# Patient Record
Sex: Female | Born: 1960
Health system: Southern US, Community
[De-identification: ages and names within clinical notes are randomized; demographics above are authoritative.]

## PROBLEM LIST (undated history)

## (undated) DIAGNOSIS — Z9889 Other specified postprocedural states: Secondary | ICD-10-CM

## (undated) DIAGNOSIS — G4730 Sleep apnea, unspecified: Secondary | ICD-10-CM

## (undated) DIAGNOSIS — I10 Essential (primary) hypertension: Secondary | ICD-10-CM

## (undated) DIAGNOSIS — R112 Nausea with vomiting, unspecified: Secondary | ICD-10-CM

## (undated) DIAGNOSIS — D649 Anemia, unspecified: Secondary | ICD-10-CM

## (undated) DIAGNOSIS — J069 Acute upper respiratory infection, unspecified: Secondary | ICD-10-CM

## (undated) DIAGNOSIS — L509 Urticaria, unspecified: Secondary | ICD-10-CM

## (undated) DIAGNOSIS — N8501 Benign endometrial hyperplasia: Secondary | ICD-10-CM

## (undated) DIAGNOSIS — R7303 Prediabetes: Secondary | ICD-10-CM

## (undated) DIAGNOSIS — F419 Anxiety disorder, unspecified: Secondary | ICD-10-CM

## (undated) DIAGNOSIS — B009 Herpesviral infection, unspecified: Secondary | ICD-10-CM

## (undated) DIAGNOSIS — E785 Hyperlipidemia, unspecified: Secondary | ICD-10-CM

## (undated) DIAGNOSIS — G43909 Migraine, unspecified, not intractable, without status migrainosus: Secondary | ICD-10-CM

## (undated) DIAGNOSIS — M7660 Achilles tendinitis, unspecified leg: Secondary | ICD-10-CM

## (undated) HISTORY — DX: Migraine, unspecified, not intractable, without status migrainosus: G43.909

## (undated) HISTORY — PX: TUBAL LIGATION: SHX77

## (undated) HISTORY — DX: Herpesviral infection, unspecified: B00.9

## (undated) HISTORY — DX: Essential (primary) hypertension: I10

## (undated) HISTORY — DX: Anxiety disorder, unspecified: F41.9

## (undated) HISTORY — PX: OTHER SURGICAL HISTORY: SHX169

## (undated) HISTORY — DX: Acute upper respiratory infection, unspecified: J06.9

## (undated) HISTORY — DX: Sleep apnea, unspecified: G47.30

## (undated) HISTORY — DX: Hyperlipidemia, unspecified: E78.5

## (undated) HISTORY — DX: Achilles tendinitis, unspecified leg: M76.60

## (undated) HISTORY — DX: Urticaria, unspecified: L50.9

---

## 1992-09-22 HISTORY — PX: TUBAL LIGATION: SHX77

## 1998-08-28 ENCOUNTER — Encounter: Payer: Self-pay | Admitting: *Deleted

## 1998-08-28 ENCOUNTER — Ambulatory Visit (HOSPITAL_COMMUNITY): Admission: RE | Admit: 1998-08-28 | Discharge: 1998-08-28 | Payer: Self-pay | Admitting: *Deleted

## 2001-01-11 ENCOUNTER — Encounter: Payer: Self-pay | Admitting: Family Medicine

## 2001-01-11 ENCOUNTER — Ambulatory Visit (HOSPITAL_COMMUNITY): Admission: RE | Admit: 2001-01-11 | Discharge: 2001-01-11 | Payer: Self-pay | Admitting: Family Medicine

## 2002-01-14 ENCOUNTER — Encounter: Payer: Self-pay | Admitting: Family Medicine

## 2002-01-14 ENCOUNTER — Ambulatory Visit (HOSPITAL_COMMUNITY): Admission: RE | Admit: 2002-01-14 | Discharge: 2002-01-14 | Payer: Self-pay | Admitting: Family Medicine

## 2002-01-19 ENCOUNTER — Other Ambulatory Visit: Admission: RE | Admit: 2002-01-19 | Discharge: 2002-01-19 | Payer: Self-pay | Admitting: Obstetrics and Gynecology

## 2003-02-21 ENCOUNTER — Ambulatory Visit (HOSPITAL_COMMUNITY): Admission: RE | Admit: 2003-02-21 | Discharge: 2003-02-21 | Payer: Self-pay | Admitting: Family Medicine

## 2003-02-21 ENCOUNTER — Encounter: Payer: Self-pay | Admitting: Family Medicine

## 2003-05-11 ENCOUNTER — Other Ambulatory Visit: Admission: RE | Admit: 2003-05-11 | Discharge: 2003-05-11 | Payer: Self-pay | Admitting: Family Medicine

## 2004-04-11 ENCOUNTER — Ambulatory Visit (HOSPITAL_COMMUNITY): Admission: RE | Admit: 2004-04-11 | Discharge: 2004-04-11 | Payer: Self-pay | Admitting: Family Medicine

## 2004-07-18 ENCOUNTER — Other Ambulatory Visit: Admission: RE | Admit: 2004-07-18 | Discharge: 2004-07-18 | Payer: Self-pay | Admitting: Family Medicine

## 2005-05-28 ENCOUNTER — Ambulatory Visit (HOSPITAL_COMMUNITY): Admission: RE | Admit: 2005-05-28 | Discharge: 2005-05-28 | Payer: Self-pay | Admitting: Family Medicine

## 2005-07-28 ENCOUNTER — Other Ambulatory Visit: Admission: RE | Admit: 2005-07-28 | Discharge: 2005-07-28 | Payer: Self-pay | Admitting: Family Medicine

## 2006-12-07 ENCOUNTER — Ambulatory Visit (HOSPITAL_COMMUNITY): Admission: RE | Admit: 2006-12-07 | Discharge: 2006-12-07 | Payer: Self-pay | Admitting: Family Medicine

## 2007-01-08 ENCOUNTER — Other Ambulatory Visit: Admission: RE | Admit: 2007-01-08 | Discharge: 2007-01-08 | Payer: Self-pay | Admitting: Family Medicine

## 2007-12-14 ENCOUNTER — Ambulatory Visit (HOSPITAL_COMMUNITY): Admission: RE | Admit: 2007-12-14 | Discharge: 2007-12-14 | Payer: Self-pay | Admitting: Family Medicine

## 2008-01-19 ENCOUNTER — Other Ambulatory Visit: Admission: RE | Admit: 2008-01-19 | Discharge: 2008-01-19 | Payer: Self-pay | Admitting: Family Medicine

## 2008-12-29 ENCOUNTER — Ambulatory Visit (HOSPITAL_COMMUNITY): Admission: RE | Admit: 2008-12-29 | Discharge: 2008-12-29 | Payer: Self-pay | Admitting: Internal Medicine

## 2009-01-11 ENCOUNTER — Encounter: Payer: Self-pay | Admitting: Internal Medicine

## 2009-09-22 HISTORY — PX: COLONOSCOPY: SHX174

## 2009-12-31 ENCOUNTER — Ambulatory Visit (HOSPITAL_COMMUNITY): Admission: RE | Admit: 2009-12-31 | Discharge: 2009-12-31 | Payer: Self-pay | Admitting: Internal Medicine

## 2010-01-15 ENCOUNTER — Other Ambulatory Visit: Admission: RE | Admit: 2010-01-15 | Discharge: 2010-01-15 | Payer: Self-pay | Admitting: Internal Medicine

## 2010-01-15 ENCOUNTER — Encounter: Payer: Self-pay | Admitting: Internal Medicine

## 2010-01-15 LAB — HM PAP SMEAR: HM Pap smear: NORMAL

## 2010-01-17 ENCOUNTER — Encounter (INDEPENDENT_AMBULATORY_CARE_PROVIDER_SITE_OTHER): Payer: Self-pay | Admitting: *Deleted

## 2010-01-31 DIAGNOSIS — R9431 Abnormal electrocardiogram [ECG] [EKG]: Secondary | ICD-10-CM | POA: Insufficient documentation

## 2010-01-31 DIAGNOSIS — E785 Hyperlipidemia, unspecified: Secondary | ICD-10-CM | POA: Insufficient documentation

## 2010-02-01 ENCOUNTER — Ambulatory Visit: Payer: Self-pay | Admitting: Internal Medicine

## 2010-02-01 DIAGNOSIS — I10 Essential (primary) hypertension: Secondary | ICD-10-CM | POA: Insufficient documentation

## 2010-02-05 ENCOUNTER — Encounter (INDEPENDENT_AMBULATORY_CARE_PROVIDER_SITE_OTHER): Payer: Self-pay | Admitting: *Deleted

## 2010-02-07 ENCOUNTER — Ambulatory Visit: Payer: Self-pay | Admitting: Gastroenterology

## 2010-02-20 LAB — HM COLONOSCOPY

## 2010-02-21 ENCOUNTER — Ambulatory Visit: Payer: Self-pay | Admitting: Gastroenterology

## 2010-03-05 ENCOUNTER — Ambulatory Visit: Payer: Self-pay | Admitting: Internal Medicine

## 2010-03-05 ENCOUNTER — Encounter: Payer: Self-pay | Admitting: Internal Medicine

## 2010-03-05 ENCOUNTER — Ambulatory Visit (HOSPITAL_COMMUNITY): Admission: RE | Admit: 2010-03-05 | Discharge: 2010-03-05 | Payer: Self-pay | Admitting: Internal Medicine

## 2010-03-05 ENCOUNTER — Ambulatory Visit: Payer: Self-pay

## 2010-03-13 ENCOUNTER — Telehealth: Payer: Self-pay | Admitting: Internal Medicine

## 2010-09-22 HISTORY — PX: CERVICAL DISC SURGERY: SHX588

## 2010-10-22 NOTE — Procedures (Signed)
Summary: Colonoscopy  Patient: [name] Note: All result statuses are Final unless otherwise noted.  Tests: (1) Colonoscopy (COL)   COL Colonoscopy           DONE (C)     [facility]     [address]     [place], [place]  [address]           COLONOSCOPY PROCEDURE REPORT           PATIENT:  Natasha, Fritz  MR#:  [id number]     BIRTHDATE:  Feb 11, 1961, 49 yrs. old  GENDER:  female           ENDOSCOPIST:  Barbette Hair. Arlyce Dice, MD     Referred by:           PROCEDURE DATE:  02/21/2010     PROCEDURE:  Diagnostic Colonoscopy     ASA CLASS:  Class II     INDICATIONS:  1) Routine Risk Screening           MEDICATIONS:   Fentanyl 100 mcg IV, Versed 9 mg IV           DESCRIPTION OF PROCEDURE:   After the risks benefits and     alternatives of the procedure were thoroughly explained, informed     consent was obtained.  Digital rectal exam was performed and     revealed no abnormalities.   The LB CF-[id number] endoscope     was introduced through the anus and advanced to the cecum, which     was identified by both the appendix and ileocecal valve, without     limitations.  The quality of the prep was excellent, using     MoviPrep.  The instrument was then slowly withdrawn as the colon     was fully examined.     <<PROCEDUREIMAGES>>           FINDINGS:  A normal appearing cecum, ileocecal valve, and     appendiceal orifice were identified. The ascending, hepatic     flexure, transverse, splenic flexure, descending, sigmoid colon,     and rectum appeared unremarkable (see image1, image2, image3,     image6, image8, image9, and image10).   Retroflexed views in the     rectum revealed no abnormalities.    The time to cecum =  6.0     minutes. The scope was then withdrawn (time =  7.0  min) from the     patient and the procedure completed.           COMPLICATIONS:  None           ENDOSCOPIC IMPRESSION:     1) Normal colon     RECOMMENDATIONS:           1) Continue current  colorectal screening recommendations for     "routine risk" patients with a repeat colonoscopy in 10 years.           REPEAT EXAM:  In 10 year(s) for Colonoscopy.           ______________________________     Barbette Hair. Arlyce Dice, MD           CC: Marisue Brooklyn, DO           n.     REVISED:  02/21/2010 09:20 AM     eSIGNED:   Barbette Hair. Johnnie Moten at 02/21/2010 09:20 AM           Gentry Fitz, [id number]  Note:  An exclamation mark (!) indicates a result that was not dispersed into the flowsheet. Document Creation Date: 02/21/2010 9:21 AM _______________________________________________________________________  (1) Order result status: Final Collection or observation date-time: 02/21/2010 09:04 Requested date-time:  Receipt date-time:  Reported date-time:  Referring Physician:   Ordering Physician: Melvia Heaps [phone]) Specimen Source:  Source: [name] Order Number: [id number] Lab site:   Appended Document: Colonoscopy    Clinical Lists Changes  Observations: Added new observation of COLONNXTDUE: 02/2020 (02/21/2010 13:03)

## 2010-10-22 NOTE — Miscellaneous (Signed)
Summary: [facility] previsit  Clinical Lists Changes  Medications: Added new medication of MOVIPREP 100 GM  SOLR (PEG-KCL-NACL-NASULF-NA ASC-C) As per prep instructions. - Signed Rx of MOVIPREP 100 GM  SOLR (PEG-KCL-NACL-NASULF-NA ASC-C) As per prep instructions.;  #1 x 0;  Signed;  Entered by: Karl Bales RN;  Authorized by: Louis Meckel MD;  Method used: Electronically to [facility]*, [address], [address] Shipping/mailing, [place], [place]  [address], Ph: [phone], Fax: [phone]    Prescriptions: MOVIPREP 100 GM  SOLR (PEG-KCL-NACL-NASULF-NA ASC-C) As per prep instructions.  #1 x 0   Entered by:   Karl Bales RN   Authorized by:   Louis Meckel MD   Signed by:   Karl Bales RN on 02/07/2010   Method used:   Electronically to        [facility]* (retail)       [address]       [address] Shipping/mailing       [place], [place]  [address]       Ph: [phone]       Fax: [phone]   RxID:   [id number]

## 2010-10-22 NOTE — Progress Notes (Signed)
Summary: rtn call from yesterday  Phone Note Call from Patient Call back at [phone]   Caller: Patient Reason for Call: Talk to Nurse, Talk to Doctor Summary of Call: rtn call from yesterday Initial call taken by: Omer Jack,  March 13, 2010 1:37 PM  Follow-up for Phone Call        Called patient with echo results. Follow-up by: Suzan Garibaldi RN

## 2010-10-22 NOTE — Letter (Signed)
Summary: Previsit letter  [facility]  [address] [place], [place] [address]   Phone: [phone]  Fax: [phone]       01/17/2010 MRN: [id number]  [facility] [address] [place], [place]  [address]  Dear Natasha Fritz,  Welcome to the Gastroenterology Division at [facility].    You are scheduled to see a nurse for your pre-procedure visit on 02-07-10 at 8:00a.m. on the 3rd floor at [facility], [address]  We ask that you try to arrive at our office 15 minutes prior to your appointment time to allow for check-in.  Your nurse visit will consist of discussing your medical and surgical history, your immediate family medical history, and your medications.    Please bring a complete list of all your medications or, if you prefer, bring the medication bottles and we will list them.  We will need to be aware of both prescribed and over the counter drugs.  We will need to know exact dosage information as well.  If you are on blood thinners (Coumadin, Plavix, Aggrenox, Ticlid, etc.) please call our office today/prior to your appointment, as we need to consult with your physician about holding your medication.   Please be prepared to read and sign documents such as consent forms, a financial agreement, and acknowledgement forms.  If necessary, and with your consent, a friend or relative is welcome to sit-in on the nurse visit with you.  Please bring your insurance card so that we may make a copy of it.  If your insurance requires a referral to see a specialist, please bring your referral form from your primary care physician.  No co-pay is required for this nurse visit.     If you cannot keep your appointment, please call [phone] to cancel or reschedule prior to your appointment date.  This allows [place] the opportunity to schedule an appointment for another patient in need of care.    Thank you for choosing [facility] for your medical needs.   We appreciate the opportunity to care for you.  Please visit [place] at our website  to learn more about our practice.                     Sincerely.                                                                                                                   The Gastroenterology Division

## 2010-10-22 NOTE — Letter (Signed)
Summary: [facility] Adolescent Physical Exam Note   [facility] Adolescent Physical Exam Note   Imported By: Roderic Ovens 03/22/2010 11:52:25  _____________________________________________________________________  External Attachment:    Type:   Image     Comment:   External Document

## 2010-10-22 NOTE — Assessment & Plan Note (Signed)
Summary: np6/abn ekg/jml   Visit Type:  new pt visit Referring Provider:  Marisue Brooklyn Primary Provider:  Marisue Brooklyn  CC:  Abnormal EKG as per Dr. Marisue Brooklyn office...does offer chest tightness as complaint...denies any sob or edema.  History of Present Illness:  Patient is a 50 year old with no prior cardiac history.  He has had very few episodes of chest pressure that can occur with and without acitity.  She really doesn't notice.  Thinks it is more surface, like muscular. She is active during the day.  Denies problems.  Does not work out regularly. She was seen recently by A. Elisabeth Most.  EKG was done that was abnormal.    S  Preventive Screening-Counseling & Management  Alcohol-Tobacco     Smoking Status: never  Caffeine-Diet-Exercise     Does Patient Exercise: yes      Drug Use:  no.    Current Medications (verified): 1)  Benicar 20 Mg Tabs (Olmesartan Medoxomil) .[name].. 1 Tab Once Daily 2)  Amerge 2.5 Mg Tabs (Naratriptan Hcl) .... Use W/3 20mg  Predisone As Needed For Migraines 3)  Valtrex 500 Mg Tabs (Valacyclovir Hcl) .... As Needed For Fever Blisters 4)  Multivitamins   Tabs (Multiple Vitamin) .[name].. 1 Tab Once Daily 5)  Super B Complex  Tabs (B Complex-C) .[name].. 1 Tab Once Daily 6)  Vitamin D3 2000 Unit Caps (Cholecalciferol) .[name].. 1 Cap Once Daily 7)  Aspirin 81 Mg Tbec (Aspirin) .... Take One Tablet By Mouth Daily 8)  Vitamin C 1000 Mg Tabs (Ascorbic Acid) .... As Needed in The Winter 9)  Vitamin E 400 Unit Caps (Vitamin E) .... As Needed  Allergies (verified): 1)  ! Pcn  Past History:  Family History: Last updated: 02/02/2010 Father: deceased @ 65 Injury during fall caused pneumonia  Social History: Last updated: 2010-02-02 Married  Tobacco Use - No.  Alcohol Use - no Regular Exercise - yes Drug Use - no Full Time  Past Medical History: HYPERTENSION (ICD-401.9)  Past Surgical History: Tubal ligation ACL replacement  Family History: Father:  deceased @ 10 Injury during fall caused pneumonia  Social History: Married  Tobacco Use - No.  Alcohol Use - no Regular Exercise - yes Drug Use - no Full Time Smoking Status:  never Does Patient Exercise:  yes Drug Use:  no  Review of Systems         All systems reviewed.  negative to the above problem Cholesterol panel from 01/16/10  total cholesterol:  189;  LDL 125 (was 109); HDl 38 (was 33)     Vital Signs:  Patient profile:   50 year old female Height:      67 inches Weight:      170 pounds BMI:     26.72 Pulse rate:   70 / minute Pulse rhythm:   irregular BP sitting:   118 / 80  (left arm) Cuff size:   large  Vitals Entered By: Danielle Rankin, CMA (02/02/10 2:58 PM)  Physical Exam  Additional Exam:  Patient is in nad HEENT:  Normocephalic, atraumatic. EOMI, PERRLA.  Neck: JVP is normal. No thyromegaly. No bruits.  Lungs: clear to auscultation. No rales no wheezes.  Heart: Regular rate and rhythm. Normal S1, S2. No S3.   No significant murmurs. PMI not displaced.  Abdomen:  Supple, nontender. Normal bowel sounds. No masses. No hepatomegaly.  Extremities:   Good distal pulses throughout. No lower extremity edema.  Musculoskeletal :moving all extremities.  Neuro:   alert and oriented x3.    Problems:  Medical Problems Added: 1)  Dx of Unspecified Cardiovascular Disease  (ICD-429.2) 2)  Dx of Hypertension  (ICD-401.9)  EKG  Procedure date:  02/01/2010  Findings:      NSR.  70 bpm Possible anterior MI  Impression & Recommendations:  Problem # 1:  ABNORMAL EKG (ICD-794.31)  I am not convinced the patient has heart problems other than controlled HTN.  EKG is abnormal but may be normal for her based on how heart sits in chest I would recommend and echo to define.  If normal I would not persue further.   I encouraged her to increase her activity.  Problem # 2:  HYPERTENSION (ICD-401.9) Good control.  Will need to be followed  Problem # 3:   HYPERLIPIDEMIA-MIXED (ICD-272.4) Discussed diet, exercise.  Should f/u  Other Orders: EKG w/ Interpretation (93000) Echocardiogram (Echo)  Patient Instructions: 1)  Your physician has requested that you have an echocardiogram.  Echocardiography is a painless test that uses sound waves to create images of your heart. It provides your doctor with information about the size and shape of your heart and how well your heart's chambers and valves are working.  This procedure takes approximately one hour. There are no restrictions for this procedure. we will call you with results.

## 2010-10-22 NOTE — Letter (Signed)
Summary: [facility] Instructions  [facility]  [address] [place], [place] [address]   Phone: [phone]  Fax: [phone]       Natasha Fritz    Nov 05, 1960    MRN: [id number]        Procedure Day Dorna Bloom:  Lenor Coffin  02/21/10     Arrival Time:  7:30am     Procedure Time:  8:30am    Location of Procedure:                    _ X_  [facility] (4th Floor)                        PREPARATION FOR COLONOSCOPY WITH MOVIPREP   Starting 5 days prior to your procedure SATURDAY 05/28  do not eat nuts, seeds, popcorn, corn, beans, peas,  salads, or any raw vegetables.  Do not take any fiber supplements (e.g. Metamucil, Citrucel, and Benefiber).  THE DAY BEFORE YOUR PROCEDURE         DATE: [facility] 06/01  1.  Drink clear liquids the entire day-NO SOLID FOOD  2.  Do not drink anything colored red or purple.  Avoid juices with pulp.  No orange juice.  3.  Drink at least 64 oz. (8 glasses) of fluid/clear liquids during the day to prevent dehydration and help the prep work efficiently.  CLEAR LIQUIDS INCLUDE: Water Jello Ice Popsicles Tea (sugar ok, no milk/cream) Powdered fruit flavored drinks Coffee (sugar ok, no milk/cream) Gatorade Juice: apple, white grape, white cranberry  Lemonade Clear bullion, consomm, broth Carbonated beverages (any kind) Strained chicken noodle soup Hard Candy                             4.  In the morning, mix first dose of MoviPrep solution:    Empty 1 Pouch A and 1 Pouch B into the disposable container    Add lukewarm drinking water to the top line of the container. Mix to dissolve    Refrigerate (mixed solution should be used within 24 hrs)  5.  Begin drinking the prep at 5:00 p.m. The MoviPrep container is divided by 4 marks.   Every 15 minutes drink the solution down to the next mark (approximately 8 oz) until the full liter is complete.   6.  Follow completed prep with 16 oz of clear liquid of your choice (Nothing  red or purple).  Continue to drink clear liquids until bedtime.  7.  Before going to bed, mix second dose of MoviPrep solution:    Empty 1 Pouch A and 1 Pouch B into the disposable container    Add lukewarm drinking water to the top line of the container. Mix to dissolve    Refrigerate  THE DAY OF YOUR PROCEDURE      DATE: THURSDAY  06/02  Beginning at  3:30 a.m. (5 hours before procedure):         1. Every 15 minutes, drink the solution down to the next mark (approx 8 oz) until the full liter is complete.  2. Follow completed prep with 16 oz. of clear liquid of your choice.    3. You may drink clear liquids until 6:30am  (2 HOURS BEFORE PROCEDURE).   MEDICATION INSTRUCTIONS  Unless otherwise instructed, you should take regular prescription medications with a small sip of water   as early as possible the  morning of your procedure.         OTHER INSTRUCTIONS  You will need a responsible adult at least 51 years of age to accompany you and drive you home.   This person must remain in the waiting room during your procedure.  Wear loose fitting clothing that is easily removed.  Leave jewelry and other valuables at home.  However, you may wish to bring a book to read or  an iPod/MP3 player to listen to music as you wait for your procedure to start.  Remove all body piercing jewelry and leave at home.  Total time from sign-in until discharge is approximately 2-3 hours.  You should go home directly after your procedure and rest.  You can resume normal activities the  day after your procedure.  The day of your procedure you should not:   Drive   Make legal decisions   Operate machinery   Drink alcohol   Return to work  You will receive specific instructions about eating, activities and medications before you leave.    The above instructions have been reviewed and explained to me by   Karl Bales RN  Feb 07, 2010 8:15 AM    I fully understand and can  verbalize these instructions _____________________________ Date _________

## 2010-10-22 NOTE — Miscellaneous (Signed)
Summary: gi [facility]  Clinical Lists Changes  Medications: Added new medication of PROMETHAZINE HCL 25 MG TABS (PROMETHAZINE HCL) 1 by mouth q 6 h as needed nausea - Signed Rx of PROMETHAZINE HCL 25 MG TABS (PROMETHAZINE HCL) 1 by mouth q 6 h as needed nausea;  #15 x 0;  Signed;  Entered by: Eual Fines RN;  Authorized by: Louis Meckel MD;  Method used: Electronically to [facility]*, [address], [place], [place]  [id number], Ph: [phone], Fax: [phone] Observations: Added new observation of ALLERGY REV: Done (02/21/2010 10:11)    Prescriptions: PROMETHAZINE HCL 25 MG TABS (PROMETHAZINE HCL) 1 by mouth q 6 h as needed nausea  #15 x 0   Entered by:   Eual Fines RN   Authorized by:   Louis Meckel MD   Signed by:   Eual Fines RN on 02/21/2010   Method used:   Electronically to        [facility]* (retail)       [address]       [place], [place]  [id number]       Ph: [phone]       Fax: [phone]   RxID:   [id number]

## 2010-12-23 ENCOUNTER — Other Ambulatory Visit (HOSPITAL_COMMUNITY): Payer: Self-pay | Admitting: Internal Medicine

## 2010-12-23 DIAGNOSIS — Z1231 Encounter for screening mammogram for malignant neoplasm of breast: Secondary | ICD-10-CM

## 2011-01-02 ENCOUNTER — Ambulatory Visit (HOSPITAL_COMMUNITY)
Admission: RE | Admit: 2011-01-02 | Discharge: 2011-01-02 | Disposition: A | Discharge status: Home / Self Care | Payer: BC Managed Care – HMO | Source: Ambulatory Visit | Attending: Internal Medicine | Admitting: Internal Medicine

## 2011-01-02 DIAGNOSIS — Z1231 Encounter for screening mammogram for malignant neoplasm of breast: Secondary | ICD-10-CM | POA: Insufficient documentation

## 2011-02-19 ENCOUNTER — Emergency Department (HOSPITAL_COMMUNITY): Payer: Managed Care, Other (non HMO)

## 2011-02-19 ENCOUNTER — Emergency Department (HOSPITAL_COMMUNITY)
Admission: EM | Admit: 2011-02-19 | Discharge: 2011-02-20 | Disposition: A | Discharge status: Home / Self Care | Payer: Managed Care, Other (non HMO) | Attending: Emergency Medicine | Admitting: Emergency Medicine

## 2011-02-19 DIAGNOSIS — M5412 Radiculopathy, cervical region: Secondary | ICD-10-CM | POA: Insufficient documentation

## 2011-02-19 DIAGNOSIS — M79609 Pain in unspecified limb: Secondary | ICD-10-CM | POA: Insufficient documentation

## 2011-02-19 DIAGNOSIS — I10 Essential (primary) hypertension: Secondary | ICD-10-CM | POA: Insufficient documentation

## 2011-02-19 DIAGNOSIS — R209 Unspecified disturbances of skin sensation: Secondary | ICD-10-CM | POA: Insufficient documentation

## 2011-02-19 DIAGNOSIS — M546 Pain in thoracic spine: Secondary | ICD-10-CM | POA: Insufficient documentation

## 2011-02-19 DIAGNOSIS — Z79899 Other long term (current) drug therapy: Secondary | ICD-10-CM | POA: Insufficient documentation

## 2011-02-19 DIAGNOSIS — M542 Cervicalgia: Secondary | ICD-10-CM | POA: Insufficient documentation

## 2011-02-19 DIAGNOSIS — R079 Chest pain, unspecified: Secondary | ICD-10-CM | POA: Insufficient documentation

## 2011-02-19 LAB — DIFFERENTIAL
Basophils Absolute: 0 10*3/uL (ref 0.0–0.1)
Basophils Relative: 0 % (ref 0–1)
Eosinophils Absolute: 0 10*3/uL (ref 0.0–0.7)
Eosinophils Relative: 0 % (ref 0–5)
Lymphocytes Relative: 7 % — ABNORMAL LOW (ref 12–46)
Lymphs Abs: 1 10*3/uL (ref 0.7–4.0)
Monocytes Absolute: 0.8 10*3/uL (ref 0.1–1.0)
Monocytes Relative: 5 % (ref 3–12)
Neutro Abs: 13.1 10*3/uL — ABNORMAL HIGH (ref 1.7–7.7)
Neutrophils Relative %: 88 % — ABNORMAL HIGH (ref 43–77)

## 2011-02-19 LAB — CBC
HCT: 36.7 % (ref 36.0–46.0)
Hemoglobin: 12.6 g/dL (ref 12.0–15.0)
MCH: 31.2 pg (ref 26.0–34.0)
MCHC: 34.3 g/dL (ref 30.0–36.0)
MCV: 90.8 fL (ref 78.0–100.0)
Platelets: 275 10*3/uL (ref 150–400)
RBC: 4.04 MIL/uL (ref 3.87–5.11)
RDW: 12.9 % (ref 11.5–15.5)
WBC: 14.9 10*3/uL — ABNORMAL HIGH (ref 4.0–10.5)

## 2011-02-19 LAB — BASIC METABOLIC PANEL
BUN: 14 mg/dL (ref 6–23)
CO2: 25 mEq/L (ref 19–32)
Calcium: 8.9 mg/dL (ref 8.4–10.5)
Chloride: 106 mEq/L (ref 96–112)
Creatinine, Ser: 0.73 mg/dL (ref 0.4–1.2)
GFR calc Af Amer: >60 mL/min (ref >60)
GFR calc non Af Amer: >60 mL/min (ref >60)
Glucose, Bld: 153 mg/dL — ABNORMAL HIGH (ref 70–99)
Potassium: 3.7 mEq/L (ref 3.5–5.1)
Sodium: 138 mEq/L (ref 135–145)

## 2011-02-19 LAB — CK TOTAL AND CKMB (NOT AT ARMC)
CK, MB: 1 ng/mL (ref 0.3–4.0)
Relative Index: INVALID (ref 0.0–2.5)
Total CK: 42 U/L (ref 7–177)

## 2011-02-19 LAB — D-DIMER, QUANTITATIVE (NOT AT ARMC): D-Dimer, Quant: 0.31 ug/mL-FEU (ref 0.00–0.48)

## 2011-02-19 LAB — TROPONIN I: Troponin I: <0.3 ng/mL (ref <0.30)

## 2011-02-24 ENCOUNTER — Ambulatory Visit: Payer: Managed Care, Other (non HMO) | Attending: Orthopedic Surgery | Admitting: Physical Therapy

## 2011-02-24 DIAGNOSIS — M6281 Muscle weakness (generalized): Secondary | ICD-10-CM | POA: Insufficient documentation

## 2011-02-24 DIAGNOSIS — M2560 Stiffness of unspecified joint, not elsewhere classified: Secondary | ICD-10-CM | POA: Insufficient documentation

## 2011-02-24 DIAGNOSIS — M25519 Pain in unspecified shoulder: Secondary | ICD-10-CM | POA: Insufficient documentation

## 2011-02-24 DIAGNOSIS — IMO0001 Reserved for inherently not codable concepts without codable children: Secondary | ICD-10-CM | POA: Insufficient documentation

## 2011-02-24 DIAGNOSIS — M25539 Pain in unspecified wrist: Secondary | ICD-10-CM | POA: Insufficient documentation

## 2011-02-24 DIAGNOSIS — M542 Cervicalgia: Secondary | ICD-10-CM | POA: Insufficient documentation

## 2011-02-26 ENCOUNTER — Ambulatory Visit: Payer: Managed Care, Other (non HMO) | Admitting: Physical Therapy

## 2011-02-28 ENCOUNTER — Ambulatory Visit: Payer: Managed Care, Other (non HMO) | Admitting: Physical Therapy

## 2011-03-03 ENCOUNTER — Ambulatory Visit: Payer: Managed Care, Other (non HMO) | Admitting: Physical Therapy

## 2011-03-05 ENCOUNTER — Ambulatory Visit: Payer: Managed Care, Other (non HMO) | Admitting: Physical Therapy

## 2011-03-07 ENCOUNTER — Encounter: Payer: Managed Care, Other (non HMO) | Admitting: Physical Therapy

## 2011-03-18 ENCOUNTER — Ambulatory Visit: Payer: Managed Care, Other (non HMO) | Admitting: Physical Therapy

## 2011-03-20 ENCOUNTER — Ambulatory Visit: Payer: Managed Care, Other (non HMO) | Admitting: Physical Therapy

## 2011-03-21 ENCOUNTER — Encounter: Payer: Managed Care, Other (non HMO) | Admitting: Physical Therapy

## 2011-03-25 ENCOUNTER — Encounter: Payer: Managed Care, Other (non HMO) | Admitting: Physical Therapy

## 2011-03-27 ENCOUNTER — Encounter: Payer: Self-pay | Admitting: Internal Medicine

## 2011-03-27 ENCOUNTER — Encounter: Payer: Managed Care, Other (non HMO) | Admitting: Physical Therapy

## 2011-04-01 ENCOUNTER — Encounter: Payer: Managed Care, Other (non HMO) | Admitting: Physical Therapy

## 2011-04-03 ENCOUNTER — Encounter: Payer: Managed Care, Other (non HMO) | Admitting: Physical Therapy

## 2011-04-04 ENCOUNTER — Encounter: Payer: Managed Care, Other (non HMO) | Admitting: Physical Therapy

## 2011-04-30 ENCOUNTER — Ambulatory Visit: Payer: Managed Care, Other (non HMO) | Attending: Orthopedic Surgery | Admitting: Physical Therapy

## 2011-04-30 DIAGNOSIS — M25519 Pain in unspecified shoulder: Secondary | ICD-10-CM | POA: Insufficient documentation

## 2011-04-30 DIAGNOSIS — M542 Cervicalgia: Secondary | ICD-10-CM | POA: Insufficient documentation

## 2011-04-30 DIAGNOSIS — IMO0001 Reserved for inherently not codable concepts without codable children: Secondary | ICD-10-CM | POA: Insufficient documentation

## 2011-04-30 DIAGNOSIS — M6281 Muscle weakness (generalized): Secondary | ICD-10-CM | POA: Insufficient documentation

## 2011-04-30 DIAGNOSIS — M25539 Pain in unspecified wrist: Secondary | ICD-10-CM | POA: Insufficient documentation

## 2011-04-30 DIAGNOSIS — M2560 Stiffness of unspecified joint, not elsewhere classified: Secondary | ICD-10-CM | POA: Insufficient documentation

## 2011-05-01 ENCOUNTER — Ambulatory Visit: Payer: Managed Care, Other (non HMO) | Admitting: Physical Therapy

## 2011-05-05 ENCOUNTER — Ambulatory Visit: Payer: Managed Care, Other (non HMO) | Admitting: Physical Therapy

## 2011-05-06 ENCOUNTER — Ambulatory Visit: Payer: Managed Care, Other (non HMO) | Admitting: Physical Therapy

## 2011-05-12 ENCOUNTER — Ambulatory Visit: Payer: Managed Care, Other (non HMO) | Admitting: Physical Therapy

## 2011-05-14 ENCOUNTER — Ambulatory Visit: Payer: Managed Care, Other (non HMO) | Admitting: Physical Therapy

## 2011-05-16 ENCOUNTER — Ambulatory Visit: Payer: Managed Care, Other (non HMO) | Admitting: Physical Therapy

## 2011-05-19 ENCOUNTER — Ambulatory Visit: Payer: Managed Care, Other (non HMO) | Admitting: Physical Therapy

## 2011-05-20 ENCOUNTER — Ambulatory Visit: Payer: Managed Care, Other (non HMO) | Admitting: Physical Therapy

## 2011-05-22 ENCOUNTER — Ambulatory Visit: Payer: Managed Care, Other (non HMO) | Admitting: Physical Therapy

## 2011-05-27 ENCOUNTER — Ambulatory Visit: Payer: Managed Care, Other (non HMO) | Attending: Orthopedic Surgery | Admitting: Physical Therapy

## 2011-05-27 DIAGNOSIS — M25539 Pain in unspecified wrist: Secondary | ICD-10-CM | POA: Insufficient documentation

## 2011-05-27 DIAGNOSIS — M6281 Muscle weakness (generalized): Secondary | ICD-10-CM | POA: Insufficient documentation

## 2011-05-27 DIAGNOSIS — IMO0001 Reserved for inherently not codable concepts without codable children: Secondary | ICD-10-CM | POA: Insufficient documentation

## 2011-05-27 DIAGNOSIS — M542 Cervicalgia: Secondary | ICD-10-CM | POA: Insufficient documentation

## 2011-05-27 DIAGNOSIS — M2560 Stiffness of unspecified joint, not elsewhere classified: Secondary | ICD-10-CM | POA: Insufficient documentation

## 2011-05-27 DIAGNOSIS — M25519 Pain in unspecified shoulder: Secondary | ICD-10-CM | POA: Insufficient documentation

## 2011-05-29 ENCOUNTER — Ambulatory Visit: Payer: Managed Care, Other (non HMO) | Admitting: Physical Therapy

## 2011-05-30 ENCOUNTER — Ambulatory Visit: Payer: Managed Care, Other (non HMO) | Admitting: Physical Therapy

## 2011-06-03 ENCOUNTER — Ambulatory Visit: Payer: Managed Care, Other (non HMO) | Admitting: Physical Therapy

## 2011-06-04 ENCOUNTER — Ambulatory Visit: Payer: Managed Care, Other (non HMO) | Admitting: Physical Therapy

## 2011-06-09 ENCOUNTER — Ambulatory Visit: Payer: Managed Care, Other (non HMO) | Admitting: Physical Therapy

## 2011-06-13 ENCOUNTER — Encounter: Payer: Managed Care, Other (non HMO) | Admitting: Physical Therapy

## 2011-06-17 ENCOUNTER — Encounter: Payer: Managed Care, Other (non HMO) | Admitting: Physical Therapy

## 2011-06-19 ENCOUNTER — Encounter: Payer: Managed Care, Other (non HMO) | Admitting: Physical Therapy

## 2011-06-24 ENCOUNTER — Encounter: Payer: Managed Care, Other (non HMO) | Admitting: Physical Therapy

## 2011-06-26 ENCOUNTER — Encounter: Payer: Managed Care, Other (non HMO) | Admitting: Physical Therapy

## 2012-01-20 ENCOUNTER — Other Ambulatory Visit (HOSPITAL_COMMUNITY): Payer: Self-pay | Admitting: Internal Medicine

## 2012-01-20 DIAGNOSIS — Z1231 Encounter for screening mammogram for malignant neoplasm of breast: Secondary | ICD-10-CM

## 2012-02-12 ENCOUNTER — Ambulatory Visit (HOSPITAL_COMMUNITY)
Admission: RE | Admit: 2012-02-12 | Discharge: 2012-02-12 | Disposition: A | Discharge status: Home / Self Care | Payer: Managed Care, Other (non HMO) | Source: Ambulatory Visit | Attending: Internal Medicine | Admitting: Internal Medicine

## 2012-02-12 DIAGNOSIS — Z1231 Encounter for screening mammogram for malignant neoplasm of breast: Secondary | ICD-10-CM

## 2012-08-31 ENCOUNTER — Encounter (HOSPITAL_COMMUNITY): Payer: Self-pay | Admitting: Pharmacist

## 2012-09-02 ENCOUNTER — Other Ambulatory Visit: Payer: Self-pay | Admitting: Obstetrics & Gynecology

## 2012-09-06 ENCOUNTER — Encounter (HOSPITAL_COMMUNITY)
Admission: RE | Admit: 2012-09-06 | Discharge: 2012-09-06 | Disposition: A | Discharge status: Home / Self Care | Payer: Managed Care, Other (non HMO) | Source: Ambulatory Visit | Attending: Obstetrics & Gynecology | Admitting: Obstetrics & Gynecology

## 2012-09-06 ENCOUNTER — Encounter (HOSPITAL_COMMUNITY): Payer: Self-pay

## 2012-09-06 HISTORY — DX: Other specified postprocedural states: Z98.890

## 2012-09-06 HISTORY — DX: Nausea with vomiting, unspecified: R11.2

## 2012-09-06 LAB — CBC
HCT: 40.2 % (ref 36.0–46.0)
Hemoglobin: 12.9 g/dL (ref 12.0–15.0)
MCH: 29.8 pg (ref 26.0–34.0)
MCHC: 32.1 g/dL (ref 30.0–36.0)
MCV: 92.8 fL (ref 78.0–100.0)
Platelets: 255 10*3/uL (ref 150–400)
RBC: 4.33 MIL/uL (ref 3.87–5.11)
RDW: 12.6 % (ref 11.5–15.5)
WBC: 6.5 10*3/uL (ref 4.0–10.5)

## 2012-09-06 LAB — SURGICAL PCR SCREEN
MRSA, PCR: NEGATIVE
Staphylococcus aureus: NEGATIVE

## 2012-09-06 NOTE — Patient Instructions (Addendum)
20 Natasha Fritz  09/06/2012   Your procedure is scheduled on:  09/10/12  Enter through the Main Entrance of [facility] at 1130 AM.  Pick up the phone at the desk and dial [phone].   Call this number if you have problems the morning of surgery: [phone]   Remember:   Do not eat food:After Midnight.  Do not drink clear liquids: 4 Hours before arrival.  Take these medicines the morning of surgery with A SIP OF WATER: Blood pressure medication   Do not wear jewelry, make-up or nail polish.  Do not wear lotions, powders, or perfumes. You may wear deodorant.  Do not shave 48 hours prior to surgery.  Do not bring valuables to the hospital.  Contacts, dentures or bridgework may not be worn into surgery.  Leave suitcase in the car. After surgery it may be brought to your room.  For patients admitted to the hospital, checkout time is 11:00 AM the day of discharge.   Patients discharged the day of surgery will not be allowed to drive home.  Name and phone number of your driver: NA  Special Instructions: Shower using CHG 2 nights before surgery and the night before surgery.  If you shower the day of surgery use CHG.  Use special wash - you have one bottle of CHG for all showers.  You should use approximately 1/3 of the bottle for each shower.   Please read over the following fact sheets that you were given: MRSA Information

## 2012-09-09 MED ORDER — GENTAMICIN SULFATE 40 MG/ML IJ SOLN
INTRAVENOUS | Status: AC
  Administered 2012-09-10: 332 mL via INTRAVENOUS
  Filled 2012-09-09: qty 8.3

## 2012-09-10 ENCOUNTER — Encounter (HOSPITAL_COMMUNITY)
Admission: RE | Disposition: A | Discharge status: Home / Self Care | Payer: Self-pay | Source: Ambulatory Visit | Attending: Obstetrics & Gynecology

## 2012-09-10 ENCOUNTER — Ambulatory Visit (HOSPITAL_COMMUNITY)
Admission: RE | Admit: 2012-09-10 | Discharge: 2012-09-11 | Disposition: A | Discharge status: Home / Self Care | Payer: Managed Care, Other (non HMO) | Source: Ambulatory Visit | Attending: Obstetrics & Gynecology | Admitting: Obstetrics & Gynecology

## 2012-09-10 ENCOUNTER — Encounter (HOSPITAL_COMMUNITY): Payer: Self-pay | Admitting: Obstetrics & Gynecology

## 2012-09-10 ENCOUNTER — Encounter (HOSPITAL_COMMUNITY): Payer: Self-pay

## 2012-09-10 ENCOUNTER — Ambulatory Visit (HOSPITAL_COMMUNITY): Payer: Managed Care, Other (non HMO)

## 2012-09-10 DIAGNOSIS — D251 Intramural leiomyoma of uterus: Secondary | ICD-10-CM | POA: Insufficient documentation

## 2012-09-10 DIAGNOSIS — N920 Excessive and frequent menstruation with regular cycle: Secondary | ICD-10-CM | POA: Insufficient documentation

## 2012-09-10 DIAGNOSIS — I10 Essential (primary) hypertension: Secondary | ICD-10-CM

## 2012-09-10 DIAGNOSIS — I2510 Atherosclerotic heart disease of native coronary artery without angina pectoris: Secondary | ICD-10-CM

## 2012-09-10 DIAGNOSIS — Z01812 Encounter for preprocedural laboratory examination: Secondary | ICD-10-CM | POA: Insufficient documentation

## 2012-09-10 DIAGNOSIS — E785 Hyperlipidemia, unspecified: Secondary | ICD-10-CM

## 2012-09-10 DIAGNOSIS — N8501 Benign endometrial hyperplasia: Secondary | ICD-10-CM | POA: Diagnosis present

## 2012-09-10 DIAGNOSIS — Z01818 Encounter for other preprocedural examination: Secondary | ICD-10-CM | POA: Insufficient documentation

## 2012-09-10 DIAGNOSIS — R9431 Abnormal electrocardiogram [ECG] [EKG]: Secondary | ICD-10-CM

## 2012-09-10 DIAGNOSIS — N838 Other noninflammatory disorders of ovary, fallopian tube and broad ligament: Secondary | ICD-10-CM | POA: Insufficient documentation

## 2012-09-10 HISTORY — DX: Benign endometrial hyperplasia: N85.01

## 2012-09-10 HISTORY — PX: ROBOTIC ASSISTED TOTAL HYSTERECTOMY: SHX6085

## 2012-09-10 HISTORY — PX: BILATERAL SALPINGECTOMY: SHX5743

## 2012-09-10 LAB — BASIC METABOLIC PANEL
BUN: 10 mg/dL (ref 6–23)
CO2: 25 mEq/L (ref 19–32)
Calcium: 9.5 mg/dL (ref 8.4–10.5)
Chloride: 101 mEq/L (ref 96–112)
Creatinine, Ser: 0.78 mg/dL (ref 0.50–1.10)
GFR calc Af Amer: >90 mL/min (ref >90)
GFR calc non Af Amer: >90 mL/min (ref >90)
Glucose, Bld: 94 mg/dL (ref 70–99)
Potassium: 3.6 mEq/L (ref 3.5–5.1)
Sodium: 138 mEq/L (ref 135–145)

## 2012-09-10 LAB — CBC
HCT: 34.8 % — ABNORMAL LOW (ref 36.0–46.0)
Hemoglobin: 11.6 g/dL — ABNORMAL LOW (ref 12.0–15.0)
MCH: 30.6 pg (ref 26.0–34.0)
MCHC: 33.3 g/dL (ref 30.0–36.0)
MCV: 91.8 fL (ref 78.0–100.0)
Platelets: 203 10*3/uL (ref 150–400)
RBC: 3.79 MIL/uL — ABNORMAL LOW (ref 3.87–5.11)
RDW: 12.3 % (ref 11.5–15.5)
WBC: 12.3 10*3/uL — ABNORMAL HIGH (ref 4.0–10.5)

## 2012-09-10 SURGERY — ROBOTIC ASSISTED TOTAL HYSTERECTOMY
Anesthesia: General | Site: Abdomen | Wound class: Clean Contaminated

## 2012-09-10 MED ORDER — HYDROMORPHONE HCL PF 1 MG/ML IJ SOLN
INTRAMUSCULAR | Status: DC | PRN
  Administered 2012-09-10: 0.5 mg via INTRAVENOUS

## 2012-09-10 MED ORDER — LIDOCAINE HCL (CARDIAC) 20 MG/ML IV SOLN
INTRAVENOUS | Status: DC | PRN
  Administered 2012-09-10: 50 mg via INTRAVENOUS

## 2012-09-10 MED ORDER — OXYCODONE-ACETAMINOPHEN 5-325 MG PO TABS
1.0000 | ORAL_TABLET | ORAL | Status: DC | PRN
  Administered 2012-09-10 – 2012-09-11 (×3): 2 via ORAL
  Administered 2012-09-11: 1 via ORAL
  Filled 2012-09-10: qty 1
  Filled 2012-09-10 (×3): qty 2
  Filled 2012-09-10: qty 1

## 2012-09-10 MED ORDER — HYDROMORPHONE HCL PF 1 MG/ML IJ SOLN
INTRAMUSCULAR | Status: AC
  Administered 2012-09-10: 0.5 mg via INTRAVENOUS
  Filled 2012-09-10: qty 1

## 2012-09-10 MED ORDER — HYDROMORPHONE HCL PF 1 MG/ML IJ SOLN
0.2500 mg | INTRAMUSCULAR | Status: DC | PRN
  Administered 2012-09-10 (×2): 0.5 mg via INTRAVENOUS

## 2012-09-10 MED ORDER — DEXAMETHASONE SODIUM PHOSPHATE 10 MG/ML IJ SOLN
INTRAMUSCULAR | Status: AC
Start: 2012-09-10 — End: 2012-09-10
  Filled 2012-09-10: qty 1

## 2012-09-10 MED ORDER — MIDAZOLAM HCL 5 MG/5ML IJ SOLN
INTRAMUSCULAR | Status: DC | PRN
  Administered 2012-09-10: 2 mg via INTRAVENOUS

## 2012-09-10 MED ORDER — ROCURONIUM BROMIDE 100 MG/10ML IV SOLN
INTRAVENOUS | Status: DC | PRN
  Administered 2012-09-10: 20 mg via INTRAVENOUS
  Administered 2012-09-10: 50 mg via INTRAVENOUS

## 2012-09-10 MED ORDER — SCOPOLAMINE 1 MG/3DAYS TD PT72
1.0000 | MEDICATED_PATCH | TRANSDERMAL | Status: DC
  Administered 2012-09-10: 1.5 mg via TRANSDERMAL

## 2012-09-10 MED ORDER — IRBESARTAN 150 MG PO TABS
150.0000 mg | ORAL_TABLET | Freq: Every day | ORAL | Status: DC
  Filled 2012-09-10: qty 1

## 2012-09-10 MED ORDER — MENTHOL 3 MG MT LOZG: 1.0000 | LOZENGE | OROMUCOSAL | Status: DC | PRN

## 2012-09-10 MED ORDER — ONDANSETRON HCL 4 MG/2ML IJ SOLN
INTRAMUSCULAR | Status: AC
  Filled 2012-09-10: qty 2

## 2012-09-10 MED ORDER — NEOSTIGMINE METHYLSULFATE 1 MG/ML IJ SOLN
INTRAMUSCULAR | Status: DC | PRN
  Administered 2012-09-10: 4 mg via INTRAVENOUS

## 2012-09-10 MED ORDER — SCOPOLAMINE 1 MG/3DAYS TD PT72
MEDICATED_PATCH | TRANSDERMAL | Status: AC
  Administered 2012-09-10: 1.5 mg via TRANSDERMAL
  Filled 2012-09-10: qty 1

## 2012-09-10 MED ORDER — ONDANSETRON HCL 4 MG/2ML IJ SOLN
INTRAMUSCULAR | Status: DC | PRN
  Administered 2012-09-10: 4 mg via INTRAVENOUS

## 2012-09-10 MED ORDER — KETOROLAC TROMETHAMINE 30 MG/ML IJ SOLN
15.0000 mg | Freq: Once | INTRAMUSCULAR | Status: AC | PRN
  Administered 2012-09-10: 30 mg via INTRAVENOUS

## 2012-09-10 MED ORDER — GLYCOPYRROLATE 0.2 MG/ML IJ SOLN
INTRAMUSCULAR | Status: DC | PRN
  Administered 2012-09-10: 0.6 mg via INTRAVENOUS
  Administered 2012-09-10: .15 mg via INTRAVENOUS

## 2012-09-10 MED ORDER — DEXAMETHASONE SODIUM PHOSPHATE 4 MG/ML IJ SOLN
INTRAMUSCULAR | Status: DC | PRN
  Administered 2012-09-10: 4 mg via INTRAVENOUS

## 2012-09-10 MED ORDER — PROMETHAZINE HCL 25 MG/ML IJ SOLN
6.2500 mg | INTRAMUSCULAR | Status: DC | PRN
  Administered 2012-09-10: 6.25 mg via INTRAVENOUS

## 2012-09-10 MED ORDER — LIDOCAINE HCL (CARDIAC) 20 MG/ML IV SOLN
INTRAVENOUS | Status: AC
  Filled 2012-09-10: qty 5

## 2012-09-10 MED ORDER — PROPOFOL 10 MG/ML IV EMUL
INTRAVENOUS | Status: DC | PRN
  Administered 2012-09-10: 200 mg via INTRAVENOUS

## 2012-09-10 MED ORDER — ROPIVACAINE HCL 5 MG/ML IJ SOLN
INTRAMUSCULAR | Status: AC
Start: 2012-09-10 — End: 2012-09-10
  Filled 2012-09-10: qty 60

## 2012-09-10 MED ORDER — PROMETHAZINE HCL 25 MG/ML IJ SOLN
INTRAMUSCULAR | Status: AC
  Administered 2012-09-10: 6.25 mg via INTRAVENOUS
  Filled 2012-09-10: qty 1

## 2012-09-10 MED ORDER — MIDAZOLAM HCL 2 MG/2ML IJ SOLN
INTRAMUSCULAR | Status: AC
  Filled 2012-09-10: qty 2

## 2012-09-10 MED ORDER — LACTATED RINGERS IV SOLN: INTRAVENOUS | Status: DC

## 2012-09-10 MED ORDER — OXYTOCIN 10 UNIT/ML IJ SOLN
INTRAMUSCULAR | Status: AC
  Filled 2012-09-10: qty 4

## 2012-09-10 MED ORDER — ARTIFICIAL TEARS OP OINT
TOPICAL_OINTMENT | OPHTHALMIC | Status: DC | PRN
  Administered 2012-09-10: 1 via OPHTHALMIC

## 2012-09-10 MED ORDER — FENTANYL CITRATE 0.05 MG/ML IJ SOLN
INTRAMUSCULAR | Status: DC | PRN
  Administered 2012-09-10: 150 ug via INTRAVENOUS
  Administered 2012-09-10: 100 ug via INTRAVENOUS

## 2012-09-10 MED ORDER — GLYCOPYRROLATE 0.2 MG/ML IJ SOLN
INTRAMUSCULAR | Status: AC
Start: 2012-09-10 — End: 2012-09-10
  Filled 2012-09-10: qty 1

## 2012-09-10 MED ORDER — ROCURONIUM BROMIDE 50 MG/5ML IV SOLN
INTRAVENOUS | Status: AC
  Filled 2012-09-10: qty 1

## 2012-09-10 MED ORDER — ONDANSETRON HCL 4 MG PO TABS: 4.0000 mg | ORAL_TABLET | Freq: Four times a day (QID) | ORAL | Status: DC | PRN

## 2012-09-10 MED ORDER — LACTATED RINGERS IV SOLN
INTRAVENOUS | Status: DC
  Administered 2012-09-10 (×2): via INTRAVENOUS

## 2012-09-10 MED ORDER — NEOSTIGMINE METHYLSULFATE 1 MG/ML IJ SOLN
INTRAMUSCULAR | Status: AC
  Filled 2012-09-10: qty 1

## 2012-09-10 MED ORDER — HYDROMORPHONE HCL PF 1 MG/ML IJ SOLN
INTRAMUSCULAR | Status: AC
  Filled 2012-09-10: qty 1

## 2012-09-10 MED ORDER — GLYCOPYRROLATE 0.2 MG/ML IJ SOLN
INTRAMUSCULAR | Status: AC
  Filled 2012-09-10: qty 3

## 2012-09-10 MED ORDER — ARTIFICIAL TEARS OP OINT
TOPICAL_OINTMENT | OPHTHALMIC | Status: AC
  Filled 2012-09-10: qty 3.5

## 2012-09-10 MED ORDER — MEPERIDINE HCL 25 MG/ML IJ SOLN: 6.2500 mg | INTRAMUSCULAR | Status: DC | PRN

## 2012-09-10 MED ORDER — CLINDAMYCIN PHOSPHATE 900 MG/50ML IV SOLN
INTRAVENOUS | Status: DC | PRN
  Administered 2012-09-10: 900 mg via INTRAVENOUS

## 2012-09-10 MED ORDER — ROPIVACAINE HCL 5 MG/ML IJ SOLN
INTRAMUSCULAR | Status: DC | PRN
  Administered 2012-09-10: 120 mL via EPIDURAL

## 2012-09-10 MED ORDER — KETOROLAC TROMETHAMINE 30 MG/ML IJ SOLN
INTRAMUSCULAR | Status: AC
  Administered 2012-09-10: 30 mg via INTRAVENOUS
  Filled 2012-09-10: qty 1

## 2012-09-10 MED ORDER — FENTANYL CITRATE 0.05 MG/ML IJ SOLN
INTRAMUSCULAR | Status: AC
  Filled 2012-09-10: qty 5

## 2012-09-10 MED ORDER — KETOROLAC TROMETHAMINE 30 MG/ML IJ SOLN: 30.0000 mg | Freq: Once | INTRAMUSCULAR | Status: DC

## 2012-09-10 MED ORDER — ONDANSETRON HCL 4 MG/2ML IJ SOLN: 4.0000 mg | Freq: Four times a day (QID) | INTRAMUSCULAR | Status: DC | PRN

## 2012-09-10 MED ORDER — LACTATED RINGERS IR SOLN
Status: DC | PRN
  Administered 2012-09-10: 3000 mL

## 2012-09-10 MED ORDER — PROPOFOL 10 MG/ML IV EMUL
INTRAVENOUS | Status: AC
Start: 2012-09-10 — End: 2012-09-10
  Filled 2012-09-10: qty 20

## 2012-09-10 MED ORDER — EPHEDRINE SULFATE 50 MG/ML IJ SOLN
INTRAMUSCULAR | Status: DC | PRN
  Administered 2012-09-10: 10 mg via INTRAVENOUS

## 2012-09-10 SURGICAL SUPPLY — 59 items
BAG URINE DRAINAGE (UROLOGICAL SUPPLIES) ×3 IMPLANT
BARRIER ADHS 3X4 INTERCEED (GAUZE/BANDAGES/DRESSINGS) IMPLANT
CABLE HIGH FREQUENCY MONO STRZ (ELECTRODE) ×3 IMPLANT
CATH FOLEY 3WAY  5CC 16FR (CATHETERS) ×1
CATH FOLEY 3WAY 5CC 16FR (CATHETERS) ×2 IMPLANT
CHLORAPREP W/TINT 26ML (MISCELLANEOUS) ×3 IMPLANT
CLOTH BEACON ORANGE TIMEOUT ST (SAFETY) ×3 IMPLANT
CONT PATH 16OZ SNAP LID 3702 (MISCELLANEOUS) ×3 IMPLANT
COVER MAYO STAND STRL (DRAPES) ×3 IMPLANT
COVER TABLE BACK 60X90 (DRAPES) ×6 IMPLANT
COVER TIP SHEARS 8 DVNC (MISCELLANEOUS) ×2 IMPLANT
COVER TIP SHEARS 8MM DA VINCI (MISCELLANEOUS) ×1
DECANTER SPIKE VIAL GLASS SM (MISCELLANEOUS) ×3 IMPLANT
DERMABOND ADVANCED (GAUZE/BANDAGES/DRESSINGS) ×1
DERMABOND ADVANCED .7 DNX12 (GAUZE/BANDAGES/DRESSINGS) ×2 IMPLANT
DRAPE HUG U DISPOSABLE (DRAPE) ×3 IMPLANT
DRAPE LG THREE QUARTER DISP (DRAPES) ×6 IMPLANT
DRAPE WARM FLUID 44X44 (DRAPE) ×3 IMPLANT
ELECT REM PT RETURN 9FT ADLT (ELECTROSURGICAL) ×3
ELECTRODE REM PT RTRN 9FT ADLT (ELECTROSURGICAL) ×2 IMPLANT
EVACUATOR SMOKE 8.L (FILTER) ×3 IMPLANT
GAUZE VASELINE 3X9 (GAUZE/BANDAGES/DRESSINGS) IMPLANT
GLOVE BIO SURGEON STRL SZ7 (GLOVE) ×12 IMPLANT
GLOVE BIOGEL PI IND STRL 7.0 (GLOVE) ×8 IMPLANT
GLOVE BIOGEL PI INDICATOR 7.0 (GLOVE) ×4
GLOVE ECLIPSE 6.5 STRL STRAW (GLOVE) ×9 IMPLANT
GOWN STRL REIN XL XLG (GOWN DISPOSABLE) ×18 IMPLANT
KIT ACCESSORY DA VINCI DISP (KITS) ×1
KIT ACCESSORY DVNC DISP (KITS) ×2 IMPLANT
LEGGING LITHOTOMY PAIR STRL (DRAPES) ×3 IMPLANT
MANIPULATOR UTERINE 4.5 ZUMI (MISCELLANEOUS) IMPLANT
OCCLUDER COLPOPNEUMO (BALLOONS) ×3 IMPLANT
PACK LAVH (CUSTOM PROCEDURE TRAY) ×3 IMPLANT
PAD PREP 24X48 CUFFED NSTRL (MISCELLANEOUS) ×6 IMPLANT
PLUG CATH AND CAP STER (CATHETERS) ×3 IMPLANT
PROTECTOR NERVE ULNAR (MISCELLANEOUS) ×6 IMPLANT
SET CYSTO W/LG BORE CLAMP LF (SET/KITS/TRAYS/PACK) ×3 IMPLANT
SET IRRIG TUBING LAPAROSCOPIC (IRRIGATION / IRRIGATOR) ×3 IMPLANT
SOLUTION ELECTROLUBE (MISCELLANEOUS) ×3 IMPLANT
SUT VICRYL 0 27 CT2 27 ABS (SUTURE) ×21 IMPLANT
SUT VICRYL 0 UR6 27IN ABS (SUTURE) ×3 IMPLANT
SUT VICRYL 4-0 PS2 18IN ABS (SUTURE) ×6 IMPLANT
SYR 30ML LL (SYRINGE) ×3 IMPLANT
SYR 50ML LL SCALE MARK (SYRINGE) ×3 IMPLANT
SYSTEM CONVERTIBLE TROCAR (TROCAR) ×3 IMPLANT
TIP RUMI ORANGE 6.7MMX12CM (TIP) IMPLANT
TIP UTERINE 5.1X6CM LAV DISP (MISCELLANEOUS) IMPLANT
TIP UTERINE 6.7X10CM GRN DISP (MISCELLANEOUS) IMPLANT
TIP UTERINE 6.7X6CM WHT DISP (MISCELLANEOUS) IMPLANT
TIP UTERINE 6.7X8CM BLUE DISP (MISCELLANEOUS) ×3 IMPLANT
TOWEL OR 17X24 6PK STRL BLUE (TOWEL DISPOSABLE) ×6 IMPLANT
TROCAR 12M 150ML BLUNT (TROCAR) ×3 IMPLANT
TROCAR DISP BLADELESS 8 DVNC (TROCAR) ×2 IMPLANT
TROCAR DISP BLADELESS 8MM (TROCAR) ×1
TROCAR XCEL 12X100 BLDLESS (ENDOMECHANICALS) ×3 IMPLANT
TROCAR Z-THREAD BLADED 5X100MM (TROCAR) ×3 IMPLANT
TUBING FILTER THERMOFLATOR (ELECTROSURGICAL) ×3 IMPLANT
WARMER LAPAROSCOPE (MISCELLANEOUS) ×3 IMPLANT
WATER STERILE IRR 1000ML POUR (IV SOLUTION) ×9 IMPLANT

## 2012-09-10 NOTE — Op Note (Signed)
Preoperative diagnosis: Menorrhagia, Simple endometrial hyperplasia Postop diagnosis: Same Procedure: da Vinci robot assisted total laparoscopic hysterectomy and bilateral salpingectomy Anesthesia Gen. Endotracheal Surgeon: Dr. Shea Evans Assistant: Dr Fuller Plan IV fluids: 2000 cc LR EBL: 50 cc Urine output: 200 cc, clear Complications: none Pathology: Uterus with cervix and both fallopian tubes Disposition: PACU, stable Findings: Normal uterus and ovaries, fallopian tubes with prior tubal ligation.   Procedure:  Indication: --  Complications of surgery including infection, bleeding, damage to internal organs and other surgery related problems including pneumonia, VTE reviewed and informed written consent was obtained. She understood and gave informed written consent.  Patient was brought to the operating room with IV running. She received Clindamycin /Gentamicin pre-op. Underwent general anesthesia without difficulty and was given dorsal lithotomy position, prepped and draped in sterile fashion. Foley catheter was placed. Cervix was exposed with a speculum and anterior lip of the cervix was grasped with tenaculum. Uterus was sounded to 9cm. A  # 8 Rumi tip and a medium Koh ring was assembled on the [employer] and entered in the uterine cavity and balloon was inflated to secure it in place. Koh ring was palpated again cervico-vaginal junction. Speculum was removed, tenaculum was left on the cervix.   Attention was focused on abdomen. Supraumbilical 12 mm transverse incision made with scalpel after injecting Marcaine, fascia dissected, grasped with Kocher's and incised, posterior rectus sheath and peritoneum grasped, incised, intraabdominal entry confirmed. Purse string stay stitch on 0-Vicryl taken on fascia and Hassan cannula introduced and Vicryl sutures secured on the Hassan cannula. Pneumoperitoneum was begun. Laparoscope was introduced and the peritoneal cavity was evaluated. There was  no evidence of adhesions. Trendelenburg position given.  Port sited marked and injected with Marcaine. Two Robotic cannulas inserted on the left side and one on the right side and a 5 mm Assistant port on the right side under vision. Robot was docked from left. PK, Prograsp and scissors introduced through Robotic arms.  Dr. Juliene Pina scrubbed out and went for surgical console.   Uterus, ovaries, tubes and ureters evaluated, appeared normal.  Uterus was deviated to the patient's right. Left salpingectomy was performed. Then left utero-ovarian ligament and round ligament were desiccated and cut. Anterior bladder broad ligament was opened and incised. Posterior broad ligament incised up to the uterosacral ligament and left uterine vessels skeletonized. Uterus was deviated to the left. Right salpingectomy was performed. Then right utero-ovarian ligament and round ligament were desiccated and cut. Anterior bladder broad ligament was opened and incised. Anterior broad ligament was incised to create bladder flap, bladder was pushed away by blunt and sharp dissection with excellent hemostasis. Koh ring impression at cervicovaginal junction was seen well anteriorly. Right posterior broad ligament dissected, right Uterine vessels skeletonized. Right uterine vessels were desiccated and cut. Uterus was deviated to the right and the left uterine vessels were desiccated and cut. Vaginal occluder was inflated. Colpotomy was begun starting from midline anteriorly coming to the left and right and then circumferentially staying above the uterosacral ligaments posteriorly. Uterus, cervix, both tubes were pulled out of the vaginal opening and vaginal occluder was placed back in vagina to maintain pneumoperitoneum.  Vaginal cut edges were evaluated for hemostasis and bleeding cauterized. Irrigation was performed pedicles appeared dry. Robotic instruments switched for needle driver and long tip tissue forceps. CT 2 needle with 0 Vicryl  was used for suturing vaginal cuff which was begun from 2 angles and then in interrupted figure-of-eight sutures on the cuff, closed without complications.  Irrigation was performed, all pedicles appeared to be hemostatic. Robotic instruments were removed. Robot was de-docked. Patient was made supine. Lap'scope was reintroduced, hemostasis was excellent. Liver, appendix, bowel appeared normal.  Interceed placed on vaginal closure. 60 cc Ropivacaine instilled in the abdomen. Robotic cannulas were removed under vision, pneumoperitoneum deflated. Laparoscope and central port removed under vision. The stay sutures at the fascia tied together with excellent fascial closure. Skin approximated with subcuticular stitches on 4-0 Vicryl. Dermabond was applied.  No vaginal bleeding noted on vaginal exam at the end. All instruments/lap/sponges counts were correct x2.  No complications. Patient tolerated procedure well and was reversed from anesthesia and brought to the PACU stable condition.   Dr Juliene Pina was the surgeon for entire case.

## 2012-09-10 NOTE — H&P (Signed)
Natasha Fritz is an 51 y.o. female.G3P3, s/p TL. Menorrhagia, with simple endometrial hyperplasia, wants definitive therapy with hysterectomy. Failed medical therapy to control bleeding.  Prior 3 SVDs, then TL. No other abdominal surgeries. No breast complaints, nl mammogrm in past.    Past Medical History  Diagnosis Date  . HTN (hypertension)   . PONV (postoperative nausea and vomiting)     Past Surgical History  Procedure Date  . Tubal ligation   . Acl replacement   . Cervical disc surgery 2012    No family history on file.  Social History:  has an unknown smoking status. She does not have any smokeless tobacco history on file. She reports that she does not drink alcohol or use illicit drugs.  Allergies:  Allergies  Allergen Reactions  . Penicillins     REACTION: hives    No prescriptions prior to admission    Review of Systems  Constitutional: Negative for fever.  Eyes: Negative for blurred vision.  Respiratory: Negative for cough.   Cardiovascular: Negative for chest pain.  Gastrointestinal: Negative for heartburn.  Genitourinary: Negative for dysuria.  Neurological: Negative for headaches.  Psychiatric/Behavioral: Negative for depression.    Height 5\' 7"  (1.702 m), weight 162 lb (73.483 kg). Physical Exam  A&O x 3, no acute distress. Pleasant HEENT neg, no thyromegaly Lungs CTA bilat CV RRR, A1S2 normal Abdo soft, non tender, non acute Extr no edema/ tenderness Pelvic  Uterus 8-10 wks, mobile, nl cervix, no adnexal masses   Assessment/Plan: Simple endometrial hyperplasia. Here for TLH/bilat salpingectomy Risks/complications of surgery reviewed incl infection, bleeding, damage to internal organs including bladder, bowels, ureters, blood vessels, other risks from anesthesia, VTE and delayed complications of any surgery, complications in future surgery reviewed.   Larsen Dungan R 09/10/2012, 8:41 AM

## 2012-09-10 NOTE — Anesthesia Postprocedure Evaluation (Signed)
Anesthesia Post Note  Patient: Natasha Fritz  Procedure(s) Performed: Procedure(s) (LRB): ROBOTIC ASSISTED TOTAL HYSTERECTOMY (N/A) BILATERAL SALPINGECTOMY (Bilateral)  Anesthesia type: GA  Patient location: PACU  Post pain: Pain level controlled  Post assessment: Post-op Vital signs reviewed  Last Vitals:  Filed Vitals:   09/10/12 1615  BP: 133/70  Pulse: 67  Temp:   Resp: 13    Post vital signs: Reviewed  Level of consciousness: sedated  Complications: No apparent anesthesia complications

## 2012-09-10 NOTE — Transfer of Care (Signed)
Immediate Anesthesia Transfer of Care Note  Patient: Natasha Fritz  Procedure(s) Performed: Procedure(s) (LRB) with comments: ROBOTIC ASSISTED TOTAL HYSTERECTOMY (N/A) - 3 hrs. BILATERAL SALPINGECTOMY (Bilateral)  Patient Location: PACU  Anesthesia Type:General  Level of Consciousness: awake, alert  and oriented  Airway & Oxygen Therapy: Patient Spontanous Breathing  Post-op Assessment: Report given to PACU RN  Post vital signs: Reviewed and stable  Complications: No apparent anesthesia complications

## 2012-09-10 NOTE — Anesthesia Preprocedure Evaluation (Signed)
Anesthesia Evaluation  Patient identified by MRN, date of birth, ID band Patient awake    Reviewed: Allergy & Precautions, H&P , NPO status , Patient's Chart, lab work & pertinent test results  History of Anesthesia Complications (+) PONV  Airway Mallampati: II TM Distance: >3 FB Neck ROM: full    Dental No notable dental hx. (+) Teeth Intact   Pulmonary neg pulmonary ROS,    Pulmonary exam normal       Cardiovascular hypertension, Pt. on medications     Neuro/Psych negative neurological ROS  negative psych ROS   GI/Hepatic negative GI ROS, Neg liver ROS,   Endo/Other  negative endocrine ROS  Renal/GU negative Renal ROS  negative genitourinary   Musculoskeletal negative musculoskeletal ROS (+)   Abdominal Normal abdominal exam  (+)   Peds negative pediatric ROS (+)  Hematology negative hematology ROS (+)   Anesthesia Other Findings   Reproductive/Obstetrics negative OB ROS                           Anesthesia Physical Anesthesia Plan  ASA: II  Anesthesia Plan: General   Post-op Pain Management:    Induction: Intravenous  Airway Management Planned: Oral ETT  Additional Equipment:   Intra-op Plan:   Post-operative Plan: Extubation in OR  Informed Consent: I have reviewed the patients History and Physical, chart, labs and discussed the procedure including the risks, benefits and alternatives for the proposed anesthesia with the patient or authorized representative who has indicated his/her understanding and acceptance.   Dental Advisory Given  Plan Discussed with: CRNA and Surgeon  Anesthesia Plan Comments:         Anesthesia Quick Evaluation

## 2012-09-11 MED ORDER — IBUPROFEN 200 MG PO TABS: 600.0000 mg | ORAL_TABLET | Freq: Four times a day (QID) | ORAL | Status: DC | PRN

## 2012-09-11 MED ORDER — OXYCODONE-ACETAMINOPHEN 5-325 MG PO TABS: 1.0000 | ORAL_TABLET | ORAL | Status: DC | PRN

## 2012-09-11 MED ORDER — ONDANSETRON HCL 4 MG PO TABS: 4.0000 mg | ORAL_TABLET | Freq: Three times a day (TID) | ORAL | Status: DC | PRN

## 2012-09-11 NOTE — Progress Notes (Signed)
Discharge instructions reviewed with patient.  Patient states understanding of home care and signs/symptoms to report to MD.  No home equipment needed.  Wheelchair to car with staff without incident and discharged home with husband.

## 2012-09-11 NOTE — Anesthesia Postprocedure Evaluation (Signed)
  Anesthesia Post-op Note  Patient: Natasha Fritz  Procedure(s) Performed: Procedure(s) (LRB) with comments: ROBOTIC ASSISTED TOTAL HYSTERECTOMY (N/A) - 3 hrs. BILATERAL SALPINGECTOMY (Bilateral)  Patient Location: [facility]  Anesthesia Type:General  Level of Consciousness: awake, alert  and oriented  Airway and Oxygen Therapy: Patient Spontanous Breathing  Post-op Pain: mild  Post-op Assessment: Patient's Cardiovascular Status Stable, Respiratory Function Stable, Patent Airway, No signs of Nausea or vomiting and Pain level controlled  Post-op Vital Signs: stable  Complications: No apparent anesthesia complications

## 2012-09-11 NOTE — Progress Notes (Signed)
1 Day Post-Op Procedure(s) (LRB): ROBOTIC ASSISTED TOTAL HYSTERECTOMY (N/A) BILATERAL SALPINGECTOMY (Bilateral)  Subjective: Patient reports no nausea, vomiting and incisional pain. Tolerated BF and has voided.   Objective: I have reviewed patient's vital signs, intake and output, medications and labs.  General: alert and cooperative Resp: clear to auscultation bilaterally Cardio: regular rate and rhythm, S1, S2 normal, no murmur, click, rub or gallop GI: soft, non-tender; bowel sounds normal; no masses,  no organomegaly Extremities: extremities normal, atraumatic, no cyanosis or edema and Homans sign is negative, no sign of DVT Vaginal Bleeding: none reported  Assessment: s/p Procedure(s) (LRB) with comments: ROBOTIC ASSISTED TOTAL HYSTERECTOMY (N/A) - 3 hrs. BILATERAL SALPINGECTOMY (Bilateral): stable, tolerating diet and ambulating, voiding, pain well controlled.   Plan: Discharge home  LOS: 1 day  Post op care reviewed.   Tiny Rietz R 09/11/2012, 9:09 AM

## 2012-09-11 NOTE — Discharge Summary (Signed)
Physician Discharge Summary  Patient ID: Natasha Fritz MRN: [id number] DOB/AGE: 03-31-1961 51 y.o.  Admit date: 09/10/2012 Discharge date: 09/11/2012  Admission Diagnoses: Endometrial hyperplasia, menorrhagia  Discharge Diagnoses: S/p Hysterectomy and bilateral salpingectomy.   Discharged Condition: good  Hospital Course: Uncomplicated post op recovery. Stable vitals, I/O adequate, tolerated general diet, voided, pain well controlled and was ambulating.  Disposition: 01-Home or Self Care  Discharge Orders    Future Orders Please Complete By Expires   Diet - low sodium heart healthy      Increase activity slowly      Lifting restrictions      Comments:   upto 25 lbs for 6 wks   Driving Restrictions      Comments:   2 wks   Sexual Activity Restrictions      Comments:   6 wks   Call MD for:  extreme fatigue      Call MD for:  persistant dizziness or light-headedness      Call MD for:  hives      Call MD for:  difficulty breathing, headache or visual disturbances      Call MD for:  redness, tenderness, or signs of infection (pain, swelling, redness, odor or green/yellow discharge around incision site)      Call MD for:  severe uncontrolled pain      Call MD for:  persistant nausea and vomiting      Call MD for:  temperature >100.4      Call MD for:      Comments:   Vaginal bleeding other than light spotting of blood       Medication List     As of 09/11/2012  9:15 AM    TAKE these medications         aspirin 81 MG EC tablet   Take 81 mg by mouth daily.      cholecalciferol 1000 UNITS tablet   Commonly known as: VITAMIN D   Take 1,000 Units by mouth daily.      Co-Enzyme Q-10 100 MG Caps   Take 1 capsule by mouth daily.      Fish Oil 1200 MG Caps   Take 1 capsule by mouth daily.      FLAX SEED OIL PO   Take 1,200 mg by mouth daily.      ibuprofen 200 MG tablet   Commonly known as: ADVIL,MOTRIN   Take 3 tablets (600 mg total) by mouth every 6 (six) hours  as needed. For pain      multivitamin tablet   Take 1 tablet by mouth daily.      naratriptan 2.5 MG tablet   Commonly known as: AMERGE   Take 2.5 mg by mouth as needed. Take one (1) tablet at onset of headache; if returns or does not resolve, may repeat after 4 hours; do not exceed five (5) mg in 24 hours. Use w/3 20mg  prednisone      olmesartan 20 MG tablet   Commonly known as: BENICAR   Take 20 mg by mouth daily.      ondansetron 4 MG tablet   Commonly known as: ZOFRAN   Take 1 tablet (4 mg total) by mouth every 8 (eight) hours as needed for nausea.      oxyCODONE-acetaminophen 5-325 MG per tablet   Commonly known as: PERCOCET/ROXICET   Take 1-2 tablets by mouth every 4 (four) hours as needed (moderate to severe pain (when tolerating fluids)).  predniSONE 20 MG tablet   Commonly known as: DELTASONE   Take 60 mg by mouth daily as needed. For migraines; takes with Amerge at onset of migraine      Red Yeast Rice Extract 600 MG Caps   Take 1,200 mg by mouth daily.      valACYclovir 500 MG tablet   Commonly known as: VALTREX   Take 500 mg by mouth daily as needed. For cold sores      vitamin C 1000 MG tablet   Take 1,000 mg by mouth as needed. In winter for colds       F/up in office 2 wks, post op care and warning signs reviewed.   Signed: Mae Denunzio R 09/11/2012, 9:15 AM

## 2012-09-11 NOTE — Addendum Note (Signed)
Addendum  created 09/11/12 1050 by Lincoln Brigham, CRNA   Modules edited:Notes Section

## 2012-09-13 ENCOUNTER — Encounter (HOSPITAL_COMMUNITY): Payer: Self-pay | Admitting: Obstetrics & Gynecology

## 2012-11-06 ENCOUNTER — Other Ambulatory Visit: Payer: Self-pay

## 2013-01-24 ENCOUNTER — Other Ambulatory Visit (HOSPITAL_COMMUNITY): Payer: Self-pay | Admitting: Internal Medicine

## 2013-01-24 DIAGNOSIS — Z1231 Encounter for screening mammogram for malignant neoplasm of breast: Secondary | ICD-10-CM

## 2013-02-15 ENCOUNTER — Ambulatory Visit (HOSPITAL_COMMUNITY)
Admission: RE | Admit: 2013-02-15 | Discharge: 2013-02-15 | Disposition: A | Discharge status: Home / Self Care | Payer: BC Managed Care – PPO | Source: Ambulatory Visit | Attending: Internal Medicine | Admitting: Internal Medicine

## 2013-02-15 DIAGNOSIS — Z1231 Encounter for screening mammogram for malignant neoplasm of breast: Secondary | ICD-10-CM | POA: Insufficient documentation

## 2013-02-15 LAB — HM MAMMOGRAPHY: HM Mammogram: NORMAL

## 2013-03-28 ENCOUNTER — Ambulatory Visit (HOSPITAL_COMMUNITY)
Admission: RE | Admit: 2013-03-28 | Discharge: 2013-03-28 | Disposition: A | Discharge status: Home / Self Care | Payer: BC Managed Care – PPO | Source: Ambulatory Visit | Attending: Internal Medicine | Admitting: Internal Medicine

## 2013-03-28 ENCOUNTER — Other Ambulatory Visit (HOSPITAL_COMMUNITY): Payer: Self-pay | Admitting: Internal Medicine

## 2013-03-28 DIAGNOSIS — M545 Low back pain, unspecified: Secondary | ICD-10-CM

## 2013-03-28 DIAGNOSIS — M25552 Pain in left hip: Secondary | ICD-10-CM

## 2013-03-28 DIAGNOSIS — M47817 Spondylosis without myelopathy or radiculopathy, lumbosacral region: Secondary | ICD-10-CM | POA: Insufficient documentation

## 2013-03-28 DIAGNOSIS — M25559 Pain in unspecified hip: Secondary | ICD-10-CM | POA: Insufficient documentation

## 2013-04-05 ENCOUNTER — Other Ambulatory Visit: Payer: Self-pay | Admitting: Surgery

## 2013-08-26 ENCOUNTER — Encounter: Payer: Self-pay | Admitting: Emergency Medicine

## 2013-08-26 ENCOUNTER — Ambulatory Visit (INDEPENDENT_AMBULATORY_CARE_PROVIDER_SITE_OTHER): Payer: BC Managed Care – PPO | Admitting: Emergency Medicine

## 2013-08-26 VITALS — BP 138/96 | HR 78 | Temp 97.8°F | Resp 16 | Ht 66.5 in | Wt 169.0 lb

## 2013-08-26 DIAGNOSIS — R300 Dysuria: Secondary | ICD-10-CM

## 2013-08-26 MED ORDER — CIPROFLOXACIN HCL 500 MG PO TABS: 500.0000 mg | ORAL_TABLET | Freq: Two times a day (BID) | ORAL | Status: AC

## 2013-08-26 MED ORDER — PHENAZOPYRIDINE HCL 200 MG PO TABS: 200.0000 mg | ORAL_TABLET | Freq: Three times a day (TID) | ORAL | Status: DC | PRN

## 2013-08-26 NOTE — Progress Notes (Signed)
Subjective:    Patient ID: Natasha Fritz, female    DOB: December 12, 1960, 52 y.o.   MRN: [id number]  HPI Comments: 2 days of increase urine frequency, dysuria and now hematuria. Denies fever or CVA tenderness. Has added AZO and Cranberry without any improvement.  Dysuria  Associated symptoms include frequency.     Current Outpatient Prescriptions on File Prior to Visit  Medication Sig Dispense Refill  . aspirin 81 MG EC tablet Take 81 mg by mouth daily.        . cholecalciferol (VITAMIN D) 1000 UNITS tablet Take 1,000 Units by mouth daily.      [name] Co-Enzyme Q-10 100 MG CAPS Take 1 capsule by mouth daily.      . Flaxseed, Linseed, (FLAX SEED OIL PO) Take 1,200 mg by mouth daily.      [name] ibuprofen (ADVIL,MOTRIN) 200 MG tablet Take 3 tablets (600 mg total) by mouth every 6 (six) hours as needed. For pain  30 tablet  0  . Multiple Vitamin (MULTIVITAMIN) tablet Take 1 tablet by mouth daily.      . naratriptan (AMERGE) 2.5 MG tablet Take 2.5 mg by mouth as needed. Take one (1) tablet at onset of headache; if returns or does not resolve, may repeat after 4 hours; do not exceed five (5) mg in 24 hours. Use w/3 20mg  prednisone       . olmesartan (BENICAR) 20 MG tablet Take 20 mg by mouth daily.        . Omega-3 Fatty Acids (FISH OIL) 1200 MG CAPS Take 1 capsule by mouth daily.      . ondansetron (ZOFRAN) 4 MG tablet Take 1 tablet (4 mg total) by mouth every 8 (eight) hours as needed for nausea.  20 tablet  0  . predniSONE (DELTASONE) 20 MG tablet Take 60 mg by mouth daily as needed. For migraines; takes with Amerge at onset of migraine      . Red Yeast Rice Extract 600 MG CAPS Take 1,200 mg by mouth daily.      . valACYclovir (VALTREX) 500 MG tablet Take 500 mg by mouth daily as needed. For cold sores      . Ascorbic Acid (VITAMIN C) 1000 MG tablet Take 1,000 mg by mouth as needed. In winter for colds      . oxyCODONE-acetaminophen (PERCOCET/ROXICET) 5-325 MG per tablet Take 1-2 tablets by mouth every 4  (four) hours as needed (moderate to severe pain (when tolerating fluids)).  30 tablet  0   No current facility-administered medications on file prior to visit.   ALLERGIES Penicillins  Past Medical History  Diagnosis Date  . HTN (hypertension)   . PONV (postoperative nausea and vomiting)   . Endometrial hyperplasia without atypia, simple 09/10/2012    Review of Systems  Genitourinary: Positive for dysuria and frequency.  All other systems reviewed and are negative.       Objective:   Physical Exam  Nursing note and vitals reviewed. Constitutional: She is oriented to person, place, and time. She appears well-developed and well-nourished.  HENT:  Head: Normocephalic and atraumatic.  Eyes: Conjunctivae are normal.  Neck: Normal range of motion.  Cardiovascular: Normal rate, regular rhythm, normal heart sounds and intact distal pulses.   Pulmonary/Chest: Effort normal and breath sounds normal.  Abdominal: Soft. Bowel sounds are normal. She exhibits no distension and no mass. There is no tenderness. There is no rebound and no guarding.  Musculoskeletal: Normal range of motion.  Neurological: She  is alert and oriented to person, place, and time.  Skin: Skin is warm and dry.  Psychiatric: She has a normal mood and affect. Judgment normal.          Assessment & Plan:  Dysuria- Increase H2o , Hygiene explained, Check Labs, Cipro 500 mg AD

## 2013-08-26 NOTE — Patient Instructions (Signed)
Urinary Tract Infection °A urinary tract infection (UTI) can occur any place along the urinary tract. The tract includes the kidneys, ureters, bladder, and urethra. A type of germ called bacteria often causes a UTI. UTIs are often helped with antibiotic medicine.  °HOME CARE  °· If given, take antibiotics as told by your doctor. Finish them even if you start to feel better. °· Drink enough fluids to keep your pee (urine) clear or pale yellow. °· Avoid tea, drinks with caffeine, and bubbly (carbonated) drinks. °· Pee often. Avoid holding your pee in for a long time. °· Pee before and after having sex (intercourse). °· Wipe from front to back after you poop (bowel movement) if you are a woman. Use each tissue only once. °GET HELP RIGHT AWAY IF:  °· You have back pain. °· You have lower belly (abdominal) pain. °· You have chills. °· You feel sick to your stomach (nauseous). °· You throw up (vomit). °· Your burning or discomfort with peeing does not go away. °· You have a fever. °· Your symptoms are not better in 3 days. °MAKE SURE YOU:  °· Understand these instructions. °· Will watch your condition. °· Will get help right away if you are not doing well or get worse. °Document Released: 02/25/2008 Document Revised: 06/02/2012 Document Reviewed: 04/08/2012 °[employer]® Patient Information ©2014 [employer]. ° °

## 2013-08-27 LAB — URINALYSIS, MICROSCOPIC ONLY
Bacteria, UA: NONE SEEN
Casts: NONE SEEN
Crystals: NONE SEEN
RBC / HPF: >50 RBC/hpf — AB (ref <3)
Squamous Epithelial / LPF: NONE SEEN
WBC, UA: >50 WBC/hpf — AB (ref <3)

## 2013-08-27 LAB — URINALYSIS, ROUTINE W REFLEX MICROSCOPIC
Bilirubin Urine: NEGATIVE
Glucose, UA: NEGATIVE mg/dL
Nitrite: POSITIVE — AB
Protein, ur: 30 mg/dL — AB
Specific Gravity, Urine: 1.018 (ref 1.005–1.030)
Urobilinogen, UA: 1 mg/dL (ref 0.0–1.0)
pH: 5 (ref 5.0–8.0)

## 2013-08-29 LAB — URINE CULTURE: Colony Count: 75000

## 2013-09-09 ENCOUNTER — Other Ambulatory Visit: Payer: BC Managed Care – PPO

## 2013-09-09 DIAGNOSIS — I10 Essential (primary) hypertension: Secondary | ICD-10-CM

## 2013-09-09 DIAGNOSIS — R7309 Other abnormal glucose: Secondary | ICD-10-CM

## 2013-09-09 DIAGNOSIS — E782 Mixed hyperlipidemia: Secondary | ICD-10-CM

## 2013-09-09 DIAGNOSIS — N3000 Acute cystitis without hematuria: Secondary | ICD-10-CM

## 2013-09-09 DIAGNOSIS — N390 Urinary tract infection, site not specified: Secondary | ICD-10-CM

## 2013-09-09 LAB — URINALYSIS, ROUTINE W REFLEX MICROSCOPIC
Bilirubin Urine: NEGATIVE
Glucose, UA: NEGATIVE mg/dL
Hgb urine dipstick: NEGATIVE
Ketones, ur: NEGATIVE mg/dL
Leukocytes, UA: NEGATIVE
Nitrite: NEGATIVE
Protein, ur: NEGATIVE mg/dL
Specific Gravity, Urine: 1.017 (ref 1.005–1.030)
Urobilinogen, UA: 0.2 mg/dL (ref 0.0–1.0)
pH: 5.5 (ref 5.0–8.0)

## 2013-09-09 LAB — BASIC METABOLIC PANEL WITH GFR
BUN: 17 mg/dL (ref 6–23)
CO2: 28 mEq/L (ref 19–32)
Calcium: 9 mg/dL (ref 8.4–10.5)
Chloride: 106 mEq/L (ref 96–112)
Creat: 0.73 mg/dL (ref 0.50–1.10)
GFR, Est African American: >89 mL/min
GFR, Est Non African American: >89 mL/min
Glucose, Bld: 101 mg/dL — ABNORMAL HIGH (ref 70–99)
Potassium: 4.2 mEq/L (ref 3.5–5.3)
Sodium: 140 mEq/L (ref 135–145)

## 2013-09-09 LAB — CBC WITH DIFFERENTIAL/PLATELET
Basophils Absolute: 0 10*3/uL (ref 0.0–0.1)
Basophils Relative: 1 % (ref 0–1)
Eosinophils Absolute: 0.1 10*3/uL (ref 0.0–0.7)
Eosinophils Relative: 1 % (ref 0–5)
HCT: 37 % (ref 36.0–46.0)
Hemoglobin: 13 g/dL (ref 12.0–15.0)
Lymphocytes Relative: 21 % (ref 12–46)
Lymphs Abs: 1.3 10*3/uL (ref 0.7–4.0)
MCH: 30.6 pg (ref 26.0–34.0)
MCHC: 35.1 g/dL (ref 30.0–36.0)
MCV: 87.1 fL (ref 78.0–100.0)
Monocytes Absolute: 0.4 10*3/uL (ref 0.1–1.0)
Monocytes Relative: 6 % (ref 3–12)
Neutro Abs: 4.2 10*3/uL (ref 1.7–7.7)
Neutrophils Relative %: 71 % (ref 43–77)
Platelets: 260 10*3/uL (ref 150–400)
RBC: 4.25 MIL/uL (ref 3.87–5.11)
RDW: 13.4 % (ref 11.5–15.5)
WBC: 5.9 10*3/uL (ref 4.0–10.5)

## 2013-09-09 LAB — HEPATIC FUNCTION PANEL
ALT: 16 U/L (ref 0–35)
AST: 17 U/L (ref 0–37)
Albumin: 4.2 g/dL (ref 3.5–5.2)
Alkaline Phosphatase: 55 U/L (ref 39–117)
Bilirubin, Direct: 0.1 mg/dL (ref 0.0–0.3)
Indirect Bilirubin: 0.5 mg/dL (ref 0.0–0.9)
Total Bilirubin: 0.6 mg/dL (ref 0.3–1.2)
Total Protein: 6.7 g/dL (ref 6.0–8.3)

## 2013-09-09 LAB — LIPID PANEL
Cholesterol: 153 mg/dL (ref 0–200)
HDL: 34 mg/dL — ABNORMAL LOW (ref >39)
LDL Cholesterol: 101 mg/dL — ABNORMAL HIGH (ref 0–99)
Total CHOL/HDL Ratio: 4.5 Ratio
Triglycerides: 92 mg/dL (ref <150)
VLDL: 18 mg/dL (ref 0–40)

## 2013-09-09 LAB — HEMOGLOBIN A1C
Hgb A1c MFr Bld: 5.5 % (ref <5.7)
Mean Plasma Glucose: 111 mg/dL (ref <117)

## 2013-09-10 LAB — URINE CULTURE
Colony Count: NO GROWTH
Organism ID, Bacteria: NO GROWTH

## 2013-09-10 LAB — INSULIN, FASTING: Insulin fasting, serum: 12 u[IU]/mL (ref 3–28)

## 2013-09-13 ENCOUNTER — Encounter: Payer: Self-pay | Admitting: Internal Medicine

## 2013-09-19 ENCOUNTER — Ambulatory Visit: Payer: Self-pay | Admitting: Emergency Medicine

## 2013-09-21 ENCOUNTER — Encounter: Payer: Self-pay | Admitting: Emergency Medicine

## 2013-09-21 ENCOUNTER — Ambulatory Visit (INDEPENDENT_AMBULATORY_CARE_PROVIDER_SITE_OTHER): Payer: BC Managed Care – PPO | Admitting: Emergency Medicine

## 2013-09-21 VITALS — BP 124/82 | HR 78 | Temp 97.8°F | Resp 16 | Ht 66.5 in | Wt 165.0 lb

## 2013-09-21 DIAGNOSIS — R509 Fever, unspecified: Secondary | ICD-10-CM

## 2013-09-21 DIAGNOSIS — Z20828 Contact with and (suspected) exposure to other viral communicable diseases: Secondary | ICD-10-CM

## 2013-09-21 DIAGNOSIS — J309 Allergic rhinitis, unspecified: Secondary | ICD-10-CM

## 2013-09-21 DIAGNOSIS — R52 Pain, unspecified: Secondary | ICD-10-CM

## 2013-09-21 DIAGNOSIS — J329 Chronic sinusitis, unspecified: Secondary | ICD-10-CM

## 2013-09-21 MED ORDER — PREDNISONE 10 MG PO TABS: ORAL_TABLET | ORAL | Status: DC

## 2013-09-21 MED ORDER — AZITHROMYCIN 250 MG PO TABS: ORAL_TABLET | ORAL | Status: AC

## 2013-09-21 MED ORDER — DEXTROMETHORPHAN-GUAIFENESIN 10-100 MG/5ML PO LIQD: 10.0000 mL | ORAL | Status: DC | PRN

## 2013-09-21 NOTE — Patient Instructions (Signed)
Influenza A (H1N1) H1N1 formerly called "swine flu" is a new influenza virus causing sickness in people. The H1N1 virus is different from seasonal influenza viruses. However, the H1N1 symptoms are similar to seasonal influenza and it is spread from person to person. You may be at higher risk for serious problems if you have underlying serious medical conditions. The CDC and the World Health Organization are following reported cases around the world. CAUSES   The flu is thought to spread mainly person-to-person through coughing or sneezing of infected people.  A person may become infected by touching something with the virus on it and then touching their mouth or nose. SYMPTOMS   Fever.  Headache.  Tiredness.  Cough.  Sore throat.  Runny or stuffy nose.  Body aches.  Diarrhea and vomiting These symptoms are referred to as "flu-like symptoms." A lot of different illnesses, including the common cold, may have similar symptoms. DIAGNOSIS   There are tests that can tell if you have the H1N1 virus.  Confirmed cases of H1N1 will be reported to the state or local health department.  A doctor's exam may be needed to tell whether you have an infection that is a complication of the flu. HOME CARE INSTRUCTIONS   Stay informed. Visit the CDC website for current recommendations. Visit www.cdc.gov/H1N1flu/. You may also call 1-800-CDC-INFO (1-800-232-4636).  Get help early if you develop any of the above symptoms.  If you are at high risk from complications of the flu, talk to your caregiver as soon as you develop flu-like symptoms. Those at higher risk for complications include:  People 65 years or older.  People with chronic medical conditions.  Pregnant women.  Young children.  Your caregiver may recommend antiviral medicine to help treat the flu.  If you get the flu, get plenty of rest, drink enough water and fluids to keep your urine clear or pale yellow, and avoid using  alcohol or tobacco.  You may take over-the-counter medicine to relieve the symptoms of the flu if your caregiver approves. (Never give aspirin to children or teenagers who have flu-like symptoms, particularly fever). TREATMENT  If you do get sick, antiviral drugs are available. These drugs can make your illness milder and make you feel better faster. Treatment should start soon after illness starts. It is only effective if taken within the first day of becoming ill. Only your caregiver can prescribe antiviral medication.  PREVENTION   Cover your nose and mouth with a tissue or your arm when you cough or sneeze. Throw the tissue away.  Wash your hands often with soap and warm water, especially after you cough or sneeze. Alcohol-based cleaners are also effective against germs.  Avoid touching your eyes, nose or mouth. This is one way germs spread.  Try to avoid contact with sick people. Follow public health advice regarding school closures. Avoid crowds.  Stay home if you get sick. Limit contact with others to keep from infecting them. People infected with the H1N1 virus may be able to infect others anywhere from 1 day before feeling sick to 5-7 days after getting flu symptoms.  An H1N1 vaccine is available to help protect against the virus. In addition to the H1N1 vaccine, you will need to be vaccinated for seasonal influenza. The H1N1 and seasonal vaccines may be given on the same day. The CDC especially recommends the H1N1 vaccine for:  Pregnant women.  People who live with or care for children younger than 6 months of age.    Health care and emergency services personnel.  Persons between the ages of 59 months through 2 years of age.  People from ages 84 through 60 years who are at higher risk for H1N1 because of chronic health disorders or immune system problems. FACEMASKS In community and home settings, the use of facemasks and N95 respirators are not normally recommended. In certain  circumstances, a facemask or N95 respirator may be used for persons at increased risk of severe illness from influenza. Your caregiver can give additional recommendations for facemask use. IN CHILDREN, EMERGENCY WARNING SIGNS THAT NEED URGENT MEDICAL CARE:  Fast breathing or trouble breathing.  Bluish skin color.  Not drinking enough fluids.  Not waking up or not interacting normally.  Being so fussy that the child does not want to be held.  Your child has an oral temperature above 102 F (38.9 C), not controlled by medicine.  Your baby is older than 3 months with a rectal temperature of 102 F (38.9 C) or higher.  Your baby is 36 months old or younger with a rectal temperature of 100.4 F (38 C) or higher.  Flu-like symptoms improve but then return with fever and worse cough. IN ADULTS, EMERGENCY WARNING SIGNS THAT NEED URGENT MEDICAL CARE:  Difficulty breathing or shortness of breath.  Pain or pressure in the chest or abdomen.  Sudden dizziness.  Confusion.  Severe or persistent vomiting.  Bluish color.  You have a oral temperature above 102 F (38.9 C), not controlled by medicine.  Flu-like symptoms improve but return with fever and worse cough. SEEK IMMEDIATE MEDICAL CARE IF:  You or someone you know is experiencing any of the above symptoms. When you arrive at the emergency center, report that you think you have the flu. You may be asked to wear a mask and/or sit in a secluded area to protect others from getting sick. MAKE SURE YOU:   Understand these instructions.  Will watch your condition.  Will get help right away if you are not doing well or get worse. Some of this information courtesy of the CDC.  Document Released: 02/25/2008 Document Revised: 12/01/2011 Document Reviewed: 02/25/2008 [facility] Patient Information 2014 [place], [place]. Sinusitis Sinusitis is redness, soreness, and puffiness (inflammation) of the air pockets in the bones of your face  (sinuses). The redness, soreness, and puffiness can cause air and mucus to get trapped in your sinuses. This can allow germs to grow and cause an infection.  HOME CARE   Drink enough fluids to keep your pee (urine) clear or pale yellow.  Use a humidifier in your home.  Run a hot shower to create steam in the bathroom. Sit in the bathroom with the door closed. Breathe in the steam 3 4 times a day.  Put a warm, moist washcloth on your face 3 4 times a day, or as told by your doctor.  Use salt water sprays (saline sprays) to wet the thick fluid in your nose. This can help the sinuses drain.  Only take medicine as told by your doctor. GET HELP RIGHT AWAY IF:   Your pain gets worse.  You have very bad headaches.  You are sick to your stomach (nauseous).  You throw up (vomit).  You are very sleepy (drowsy) all the time.  Your face is puffy (swollen).  Your vision changes.  You have a stiff neck.  You have trouble breathing. MAKE SURE YOU:   Understand these instructions.  Will watch your condition.  Will get help right  away if you are not doing well or get worse. Document Released: 02/25/2008 Document Revised: 06/02/2012 Document Reviewed: 04/13/2012 [facility] Patient Information 2014 [place], [place].

## 2013-09-23 NOTE — Progress Notes (Signed)
Subjective:    Patient ID: Natasha Fritz, female    DOB: 1960/11/29, 53 y.o.   MRN: [id number]  HPI Comments: 53 YO FEMALE with 3 days of increasing symptoms of ST, head congestion, aches, fevers with + flu exposure last week. She has felt cold symptoms since Christmas but increased over last 3 days. No relief with OTC.  Sore Throat  Associated symptoms include coughing.  Sinusitis Associated symptoms include coughing, sinus pressure and a sore throat.  Fever  Associated symptoms include coughing and a sore throat.   Current Outpatient Prescriptions on File Prior to Visit  Medication Sig Dispense Refill  . ALPRAZolam (XANAX) 0.5 MG tablet Take 0.5 mg by mouth 2 (two) times daily as needed for anxiety.      . Ascorbic Acid (VITAMIN C) 1000 MG tablet Take 1,000 mg by mouth as needed. In winter for colds      . aspirin 81 MG EC tablet Take 81 mg by mouth daily.        . cholecalciferol (VITAMIN D) 1000 UNITS tablet Take 1,000 Units by mouth daily.      [name] Co-Enzyme Q-10 100 MG CAPS Take 1 capsule by mouth daily.      . Flaxseed, Linseed, (FLAX SEED OIL PO) Take 1,200 mg by mouth daily.      [name] ibuprofen (ADVIL,MOTRIN) 200 MG tablet Take 3 tablets (600 mg total) by mouth every 6 (six) hours as needed. For pain  30 tablet  0  . Multiple Vitamin (MULTIVITAMIN) tablet Take 1 tablet by mouth daily.      . naratriptan (AMERGE) 2.5 MG tablet Take 2.5 mg by mouth as needed. Take one (1) tablet at onset of headache; if returns or does not resolve, may repeat after 4 hours; do not exceed five (5) mg in 24 hours. Use w/3 20mg  prednisone       . olmesartan (BENICAR) 20 MG tablet Take 20 mg by mouth daily.        . Omega-3 Fatty Acids (FISH OIL) 1200 MG CAPS Take 1 capsule by mouth daily.      . predniSONE (DELTASONE) 20 MG tablet Take 60 mg by mouth daily as needed. For migraines; takes with Amerge at onset of migraine      . Red Yeast Rice Extract 600 MG CAPS Take 1,200 mg by mouth daily.      .  valACYclovir (VALTREX) 500 MG tablet Take 500 mg by mouth daily as needed. For cold sores      . phenazopyridine (PYRIDIUM) 200 MG tablet Take 1 tablet (200 mg total) by mouth 3 (three) times daily as needed for pain.  20 tablet  0   No current facility-administered medications on file prior to visit.   ALLERGIES Penicillins  Past Medical History  Diagnosis Date  . HTN (hypertension)   . PONV (postoperative nausea and vomiting)   . 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 Hyperlipidemia   . Anxiety   . Migraine   . HSV-1 infection        Review of Systems  Constitutional: Positive for fever and fatigue.  HENT: Positive for sinus pressure and sore throat.   Respiratory: Positive for cough.   Musculoskeletal: Positive for arthralgias and myalgias.   BP 124/82  Pulse 78  Temp(Src) 97.8 F (36.6 C) (Temporal)  Resp 16  Ht 5' 6.5" (1.689 m)  Wt 165 lb (74.844 kg)  BMI 26.24 kg/m2  LMP 09/05/2012  Objective:   Physical Exam  Nursing note and vitals reviewed. Constitutional: She is oriented to person, place, and time. She appears well-developed and well-nourished.  HENT:  Head: Normocephalic and atraumatic.  Right Ear: External ear normal.  Left Ear: External ear normal.  Nose: Nose normal.  Mouth/Throat: Oropharynx is clear and moist.  Frontal tenderness, yellow TMs  Eyes: Conjunctivae and EOM are normal.  Neck: Normal range of motion.  Cardiovascular: Normal rate, regular rhythm, normal heart sounds and intact distal pulses.   Pulmonary/Chest: Effort normal and breath sounds normal.  Musculoskeletal: Normal range of motion.  Lymphadenopathy:    She has no cervical adenopathy.  Neurological: She is alert and oriented to person, place, and time.  Skin: Skin is warm and dry.  Psychiatric: She has a normal mood and affect. Judgment normal.          Assessment & Plan:  Sinusitis/ Fever/ fatigue/ Allergic rhinitis- Allegra OTC, increase  H2o, allergy hygiene explained. Zpak/ Pred DP 10 mg both AD. OOW x 3 days

## 2013-09-26 ENCOUNTER — Ambulatory Visit: Payer: Self-pay | Admitting: Emergency Medicine

## 2013-09-28 NOTE — Progress Notes (Signed)
3 mo f/u appt scheduled w/ MS

## 2013-10-07 ENCOUNTER — Encounter: Payer: Self-pay | Admitting: Emergency Medicine

## 2013-10-07 ENCOUNTER — Other Ambulatory Visit: Payer: Self-pay | Admitting: Emergency Medicine

## 2013-10-07 MED ORDER — LEVOFLOXACIN 500 MG PO TABS
500.0000 mg | ORAL_TABLET | Freq: Every day | ORAL | Status: AC
Start: 2013-10-07 — End: 2013-10-17

## 2013-10-14 NOTE — Telephone Encounter (Signed)
Message copied by Oliver Pila on Fri Oct 14, 2013  4:52 PM ------      Message from: [place], [place] R      Created: Mon Aug 29, 2013  4:53 AM       + UTI continue antibiotics as directed, increased water intake. Recheck 1 month lab only UA, C/S ------

## 2013-12-13 ENCOUNTER — Other Ambulatory Visit: Payer: Self-pay | Admitting: Emergency Medicine

## 2013-12-13 DIAGNOSIS — E782 Mixed hyperlipidemia: Secondary | ICD-10-CM

## 2013-12-13 DIAGNOSIS — R7309 Other abnormal glucose: Secondary | ICD-10-CM

## 2013-12-13 DIAGNOSIS — Z79899 Other long term (current) drug therapy: Secondary | ICD-10-CM

## 2013-12-14 ENCOUNTER — Other Ambulatory Visit: Payer: BC Managed Care – PPO

## 2013-12-14 DIAGNOSIS — R7309 Other abnormal glucose: Secondary | ICD-10-CM

## 2013-12-14 DIAGNOSIS — E782 Mixed hyperlipidemia: Secondary | ICD-10-CM

## 2013-12-14 DIAGNOSIS — Z79899 Other long term (current) drug therapy: Secondary | ICD-10-CM

## 2013-12-14 LAB — CBC WITH DIFFERENTIAL/PLATELET
Basophils Absolute: 0.1 10*3/uL (ref 0.0–0.1)
Basophils Relative: 1 % (ref 0–1)
Eosinophils Absolute: 0.1 10*3/uL (ref 0.0–0.7)
Eosinophils Relative: 2 % (ref 0–5)
HCT: 38 % (ref 36.0–46.0)
Hemoglobin: 13.4 g/dL (ref 12.0–15.0)
Lymphocytes Relative: 21 % (ref 12–46)
Lymphs Abs: 1.5 10*3/uL (ref 0.7–4.0)
MCH: 31 pg (ref 26.0–34.0)
MCHC: 35.3 g/dL (ref 30.0–36.0)
MCV: 88 fL (ref 78.0–100.0)
Monocytes Absolute: 0.4 10*3/uL (ref 0.1–1.0)
Monocytes Relative: 6 % (ref 3–12)
Neutro Abs: 4.9 10*3/uL (ref 1.7–7.7)
Neutrophils Relative %: 70 % (ref 43–77)
Platelets: 263 10*3/uL (ref 150–400)
RBC: 4.32 MIL/uL (ref 3.87–5.11)
RDW: 13.6 % (ref 11.5–15.5)
WBC: 7 10*3/uL (ref 4.0–10.5)

## 2013-12-14 LAB — HEPATIC FUNCTION PANEL
ALT: 14 U/L (ref 0–35)
AST: 15 U/L (ref 0–37)
Albumin: 4.3 g/dL (ref 3.5–5.2)
Alkaline Phosphatase: 55 U/L (ref 39–117)
Bilirubin, Direct: 0.1 mg/dL (ref 0.0–0.3)
Indirect Bilirubin: 0.4 mg/dL (ref 0.2–1.2)
Total Bilirubin: 0.5 mg/dL (ref 0.2–1.2)
Total Protein: 6.7 g/dL (ref 6.0–8.3)

## 2013-12-14 LAB — BASIC METABOLIC PANEL WITH GFR
BUN: 17 mg/dL (ref 6–23)
CO2: 27 mEq/L (ref 19–32)
Calcium: 9.2 mg/dL (ref 8.4–10.5)
Chloride: 103 mEq/L (ref 96–112)
Creat: 0.76 mg/dL (ref 0.50–1.10)
GFR, Est African American: >89 mL/min
GFR, Est Non African American: >89 mL/min
Glucose, Bld: 92 mg/dL (ref 70–99)
Potassium: 4 mEq/L (ref 3.5–5.3)
Sodium: 139 mEq/L (ref 135–145)

## 2013-12-14 LAB — LIPID PANEL
Cholesterol: 151 mg/dL (ref 0–200)
HDL: 40 mg/dL (ref >39)
LDL Cholesterol: 86 mg/dL (ref 0–99)
Total CHOL/HDL Ratio: 3.8 Ratio
Triglycerides: 123 mg/dL (ref <150)
VLDL: 25 mg/dL (ref 0–40)

## 2013-12-14 LAB — HEMOGLOBIN A1C
Hgb A1c MFr Bld: 5.6 % (ref <5.7)
Mean Plasma Glucose: 114 mg/dL (ref <117)

## 2013-12-20 IMAGING — CR DG LUMBAR SPINE COMPLETE 4+V
5 series · 5 of 5 positions shown · non-contrast
Comparison: None.

CLINICAL DATA: Left hip pain for 1 year

LUMBAR SPINE - COMPLETE 4+ VIEW

[t l-spine a.p.]
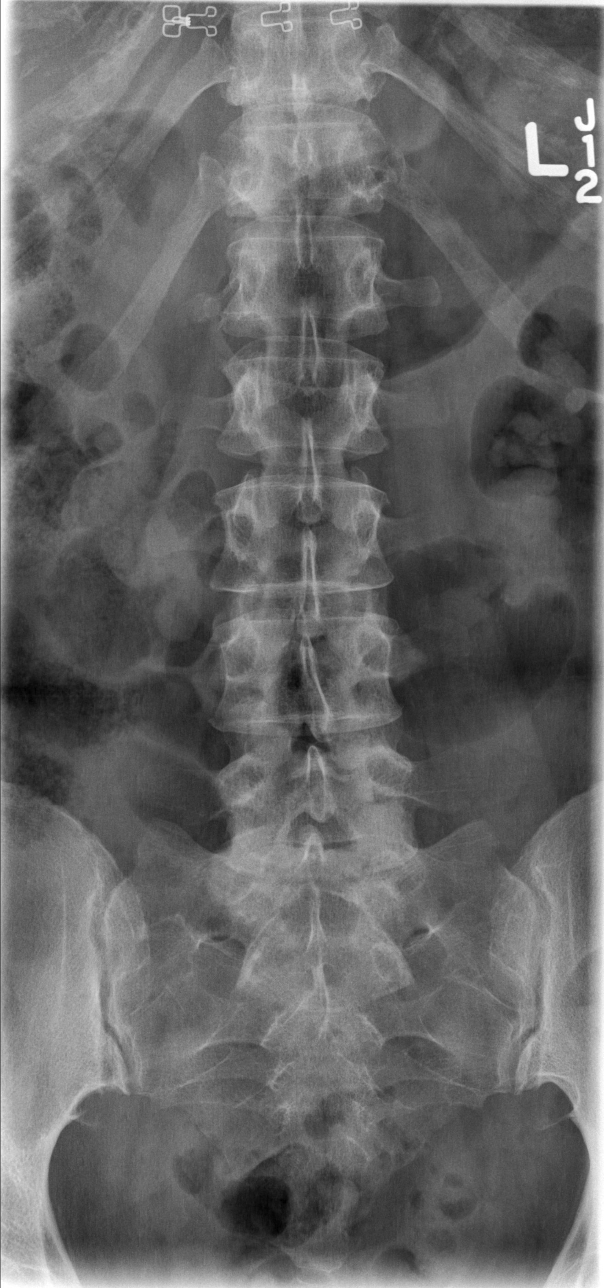

[t l-spine oblique exposure (1 of 2)]
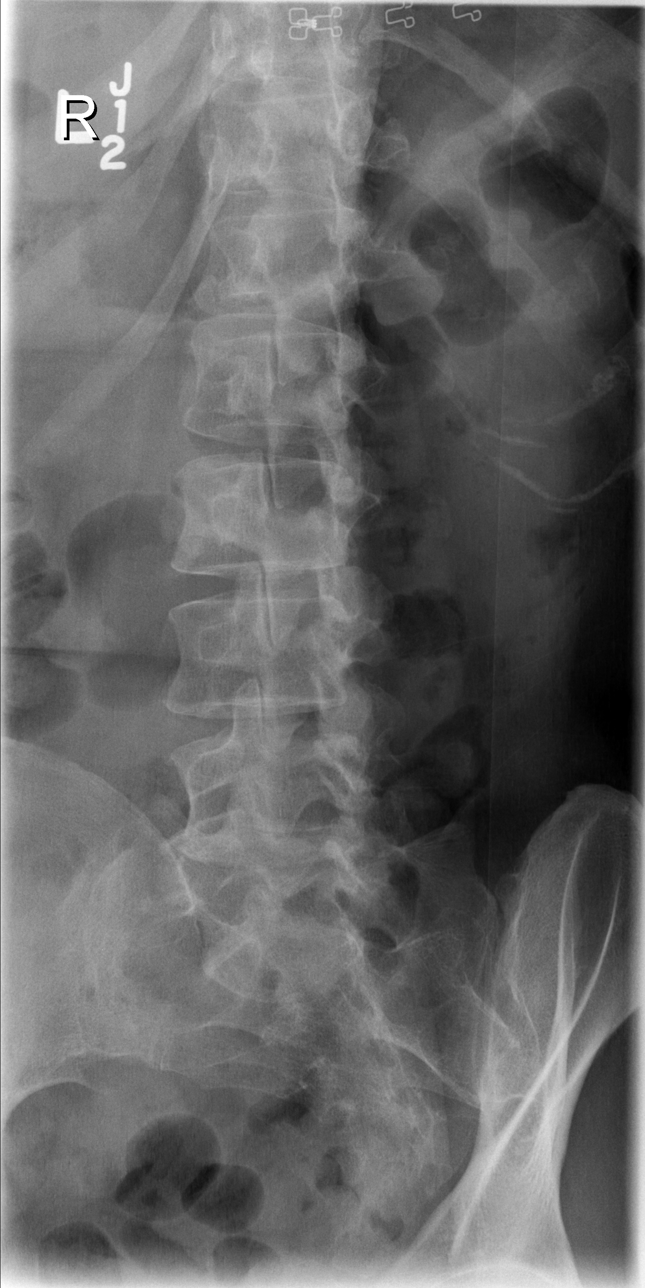

[t l-spine oblique exposure (2 of 2)]
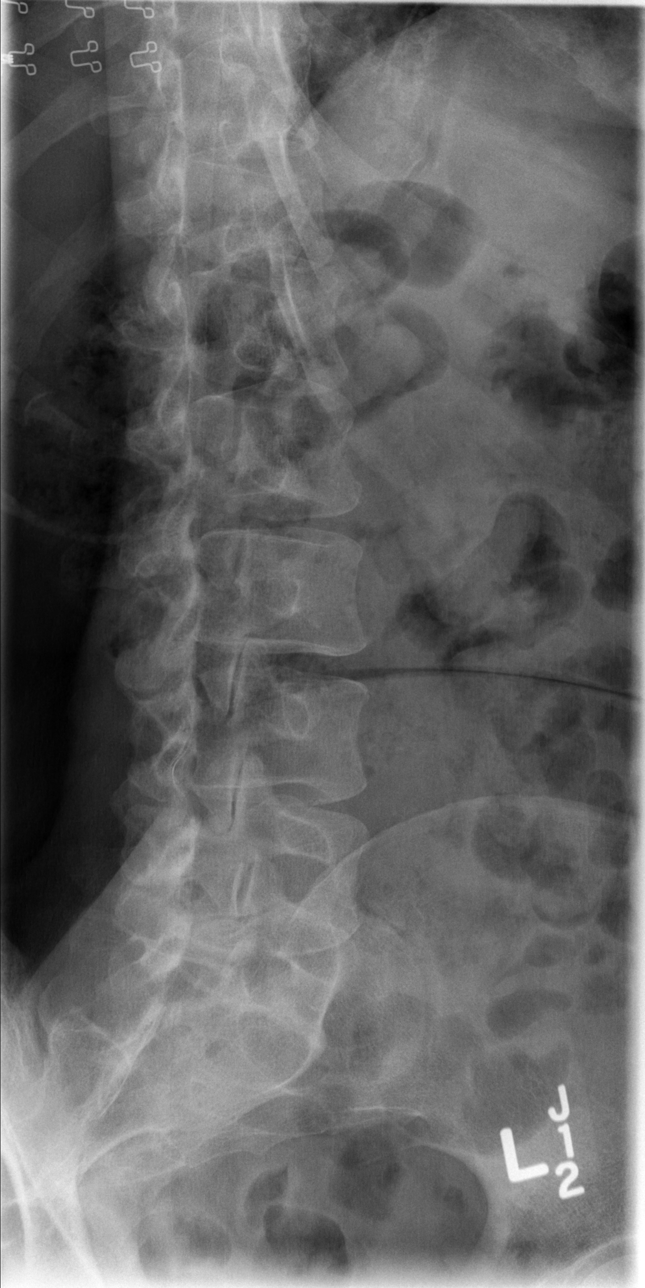

[t l-spine lat]
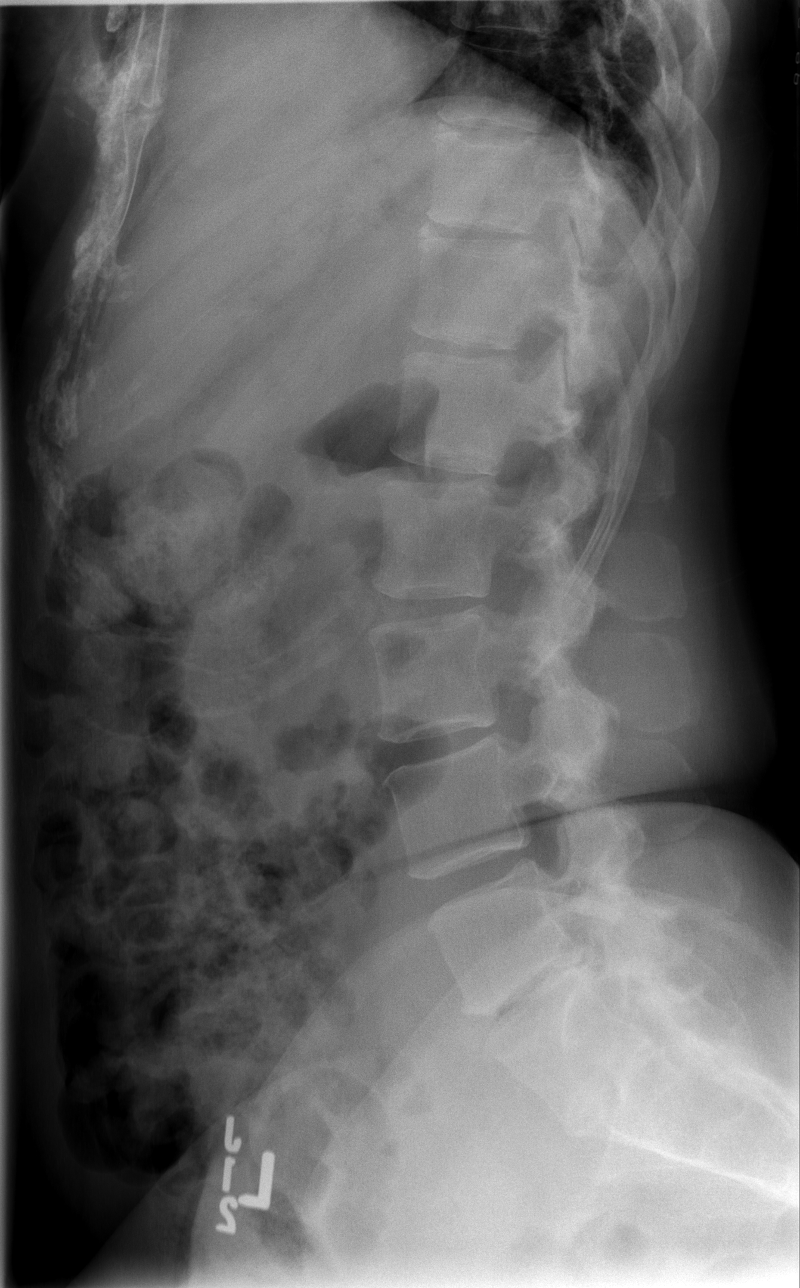

[t l-spine l5-s1 spot]
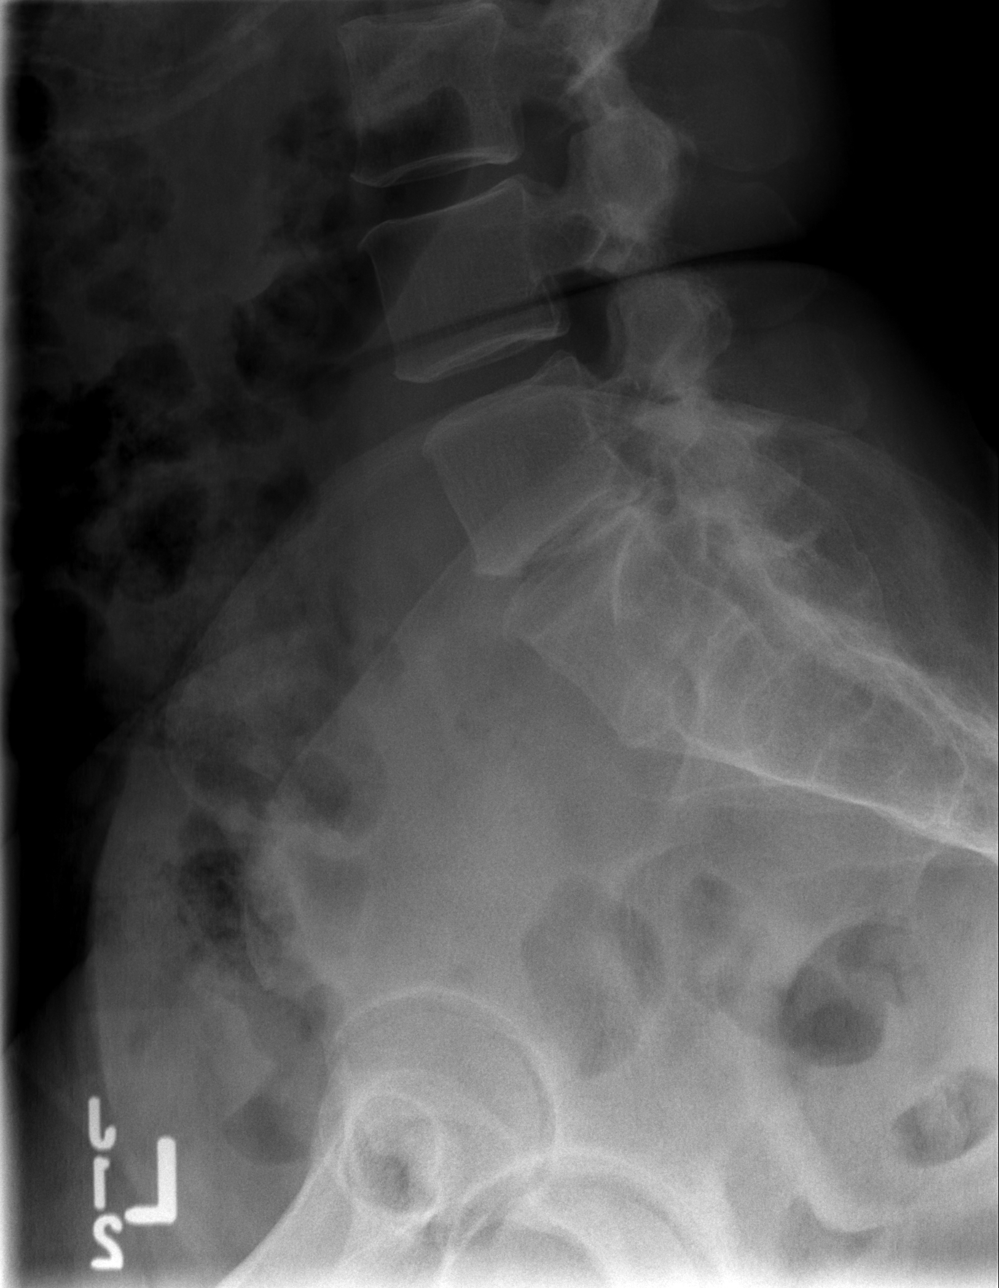

[5 of 5 positions shown; findings below may reference images not displayed]

FINDINGS: Normal antral posterior alignment of the lumbar spine.
There is sacralization of L5.  There is moderate L4-5 spondylosis.
There is mild L2-3 spondylosis.  There is mild bilateral sacroiliac
degenerative change.  No pars defects or significant facet
arthropathy noted.
IMPRESSION: Degenerative changes most prominent at L4-5.

## 2013-12-21 ENCOUNTER — Ambulatory Visit (INDEPENDENT_AMBULATORY_CARE_PROVIDER_SITE_OTHER): Payer: BC Managed Care – PPO | Admitting: Emergency Medicine

## 2013-12-21 ENCOUNTER — Encounter: Payer: Self-pay | Admitting: Emergency Medicine

## 2013-12-21 VITALS — BP 144/92 | HR 64 | Temp 98.2°F | Resp 16 | Ht 66.5 in | Wt 165.0 lb

## 2013-12-21 DIAGNOSIS — M25579 Pain in unspecified ankle and joints of unspecified foot: Secondary | ICD-10-CM

## 2013-12-21 DIAGNOSIS — R7309 Other abnormal glucose: Secondary | ICD-10-CM

## 2013-12-21 DIAGNOSIS — I10 Essential (primary) hypertension: Secondary | ICD-10-CM

## 2013-12-21 DIAGNOSIS — E782 Mixed hyperlipidemia: Secondary | ICD-10-CM

## 2013-12-21 NOTE — Progress Notes (Signed)
Subjective:    Patient ID: Natasha Fritz, female    DOB: 27-Jul-1961, 53 y.o.   MRN: [id number]  HPI Comments: 53 yo female presents for 3 month F/U for HTN, Cholesterol, Pre-Dm, D. Deficient. She is exercising routinely and eating healthy.  She notes new exercise program 3 x weekly plus increased normal activity. She notes her BP has been good outside of the office. Last labs CHOL         151   12/14/2013 HDL           40   12/14/2013 LDLCALC       86   12/14/2013 TRIG         123   12/14/2013 CHOLHDL      3.8   12/14/2013 ALT           14   12/14/2013 AST           15   12/14/2013 ALKPHOS       55   12/14/2013 BILITOT      0.5   12/14/2013 HGBA1C      5.6   12/14/2013 CREATININE     0.76   12/14/2013 BUN              17   12/14/2013 NA              139   12/14/2013 K               4.0   12/14/2013 CL              103   12/14/2013 CO2              27   12/14/2013 She notes increased foot pain with increased activity. She notes more pain in heels. She notes pain worse in morning. She is trying to help with stretches/ advil with mild improvement.  Hypertension Associated symptoms include anxiety.  Anxiety     Current Outpatient Prescriptions on File Prior to Visit  Medication Sig Dispense Refill  . ALPRAZolam (XANAX) 0.5 MG tablet Take 0.5 mg by mouth 2 (two) times daily as needed for anxiety.      . Ascorbic Acid (VITAMIN C) 1000 MG tablet Take 1,000 mg by mouth as needed. In winter for colds      . aspirin 81 MG EC tablet Take 81 mg by mouth daily.        . cholecalciferol (VITAMIN D) 1000 UNITS tablet Take 1,000 Units by mouth daily.      [name] Co-Enzyme Q-10 100 MG CAPS Take 1 capsule by mouth daily.      [name] ibuprofen (ADVIL,MOTRIN) 200 MG tablet Take 3 tablets (600 mg total) by mouth every 6 (six) hours as needed. For pain  30 tablet  0  . Multiple Vitamin (MULTIVITAMIN) tablet Take 1 tablet by mouth daily.      . naratriptan (AMERGE) 2.5 MG tablet Take 2.5 mg by mouth as needed. Take one (1)  tablet at onset of headache; if returns or does not resolve, may repeat after 4 hours; do not exceed five (5) mg in 24 hours. Use w/3 20mg  prednisone       . olmesartan (BENICAR) 20 MG tablet Take 20 mg by mouth daily.        . Omega-3 Fatty Acids (FISH OIL) 1200 MG CAPS Take 1 capsule by mouth daily.      . Red Yeast Rice Extract 600 MG CAPS Take 1,200 mg  by mouth daily.      . valACYclovir (VALTREX) 500 MG tablet Take 500 mg by mouth daily as needed. For cold sores      . predniSONE (DELTASONE) 20 MG tablet Take 60 mg by mouth daily as needed. For migraines; takes with Amerge at onset of migraine       No current facility-administered medications on file prior to visit.   Allergies  Allergen Reactions  . Penicillins     REACTION: hives   Past Medical History  Diagnosis Date  . HTN (hypertension)   . PONV (postoperative nausea and vomiting)   . 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 Hyperlipidemia   . Anxiety   . Migraine   . HSV-1 infection       Review of Systems  Musculoskeletal: Positive for arthralgias, gait problem and myalgias.  All other systems reviewed and are negative.   BP 144/92  Pulse 64  Temp(Src) 98.2 F (36.8 C) (Temporal)  Resp 16  Ht 5' 6.5" (1.689 m)  Wt 165 lb (74.844 kg)  BMI 26.24 kg/m2  LMP 09/05/2012 REcheck 140/ 88    Objective:   Physical Exam  Nursing note and vitals reviewed. Constitutional: She is oriented to person, place, and time. She appears well-developed and well-nourished. No distress.  HENT:  Head: Normocephalic and atraumatic.  Right Ear: External ear normal.  Left Ear: External ear normal.  Nose: Nose normal.  Mouth/Throat: Oropharynx is clear and moist.  Eyes: Conjunctivae and EOM are normal.  Neck: Normal range of motion. Neck supple. No JVD present. No thyromegaly present.  Cardiovascular: Normal rate, regular rhythm, normal heart sounds and intact distal pulses.   Pulmonary/Chest: Effort normal  and breath sounds normal.  Abdominal: Soft. Bowel sounds are normal. She exhibits no distension and no mass. There is no tenderness. There is no rebound and no guarding.  Musculoskeletal: Normal range of motion. She exhibits no edema and no tenderness.  Lymphadenopathy:    She has no cervical adenopathy.  Neurological: She is alert and oriented to person, place, and time. No cranial nerve deficit. Coordination normal.  Skin: Skin is warm and dry. No rash noted. No erythema. No pallor.  Psychiatric: She has a normal mood and affect. Her behavior is normal. Judgment and thought content normal.          Assessment & Plan:  1.  3 month F/U for HTN, Cholesterol, Pre-Dm, D. Deficient. Needs healthy diet, cardio QD and obtain healthy weight. Check Labs, Check BP if >130/80 call office  2. ? Foot pain vs plantar fasciitis- Advised continue stretching, good workout shoes and ice after exercising. If no change xray to eval for heel spurs.

## 2013-12-21 NOTE — Patient Instructions (Signed)
Plantar Fasciitis (Heel Spur Syndrome)  with Rehab  The plantar fascia is a fibrous, ligament-like, soft-tissue structure that spans the bottom of the foot. Plantar fasciitis is a condition that causes pain in the foot due to inflammation of the tissue.  SYMPTOMS   · Pain and tenderness on the underneath side of the foot.  · Pain that worsens with standing or walking.  CAUSES   Plantar fasciitis is caused by irritation and injury to the plantar fascia on the underneath side of the foot. Common mechanisms of injury include:  · Direct trauma to bottom of the foot.  · Damage to a small nerve that runs under the foot where the main fascia attaches to the heel bone.  · Stress placed on the plantar fascia due to bone spurs.  RISK INCREASES WITH:   · Activities that place stress on the plantar fascia (running, jumping, pivoting, or cutting).  · Poor strength and flexibility.  · Improperly fitted shoes.  · Tight calf muscles.  · Flat feet.  · Failure to warm-up properly before activity.  · Obesity.  PREVENTION  · Warm up and stretch properly before activity.  · Allow for adequate recovery between workouts.  · Maintain physical fitness:  · Strength, flexibility, and endurance.  · Cardiovascular fitness.  · Maintain a health body weight.  · Avoid stress on the plantar fascia.  · Wear properly fitted shoes, including arch supports for individuals who have flat feet.  PROGNOSIS   If treated properly, then the symptoms of plantar fasciitis usually resolve without surgery. However, occasionally surgery is necessary.  RELATED COMPLICATIONS   · Recurrent symptoms that may result in a chronic condition.  · Problems of the lower back that are caused by compensating for the injury, such as limping.  · Pain or weakness of the foot during push-off following surgery.  · Chronic inflammation, scarring, and partial or complete fascia tear, occurring more often from repeated injections.  TREATMENT   Treatment initially involves the use of  ice and medication to help reduce pain and inflammation. The use of strengthening and stretching exercises may help reduce pain with activity, especially stretches of the Achilles tendon. These exercises may be performed at home or with a therapist. Your caregiver may recommend that you use heel cups of arch supports to help reduce stress on the plantar fascia. Occasionally, corticosteroid injections are given to reduce inflammation. If symptoms persist for greater than 6 months despite non-surgical (conservative), then surgery may be recommended.   MEDICATION   · If pain medication is necessary, then nonsteroidal anti-inflammatory medications, such as aspirin and ibuprofen, or other minor pain relievers, such as acetaminophen, are often recommended.  · Do not take pain medication within 7 days before surgery.  · Prescription pain relievers may be given if deemed necessary by your caregiver. Use only as directed and only as much as you need.  · Corticosteroid injections may be given by your caregiver. These injections should be reserved for the most serious cases, because they may only be given a certain number of times.  HEAT AND COLD  · Cold treatment (icing) relieves pain and reduces inflammation. Cold treatment should be applied for 10 to 15 minutes every 2 to 3 hours for inflammation and pain and immediately after any activity that aggravates your symptoms. Use ice packs or massage the area with a piece of ice (ice massage).  · Heat treatment may be used prior to performing the stretching and strengthening activities prescribed   by your caregiver, physical therapist, or athletic trainer. Use a heat pack or soak the injury in warm water.  SEEK IMMEDIATE MEDICAL CARE IF:  · Treatment seems to offer no benefit, or the condition worsens.  · Any medications produce adverse side effects.  EXERCISES  RANGE OF MOTION (ROM) AND STRETCHING EXERCISES - Plantar Fasciitis (Heel Spur Syndrome)  These exercises may help you  when beginning to rehabilitate your injury. Your symptoms may resolve with or without further involvement from your physician, physical therapist or athletic trainer. While completing these exercises, remember:   · Restoring tissue flexibility helps normal motion to return to the joints. This allows healthier, less painful movement and activity.  · An effective stretch should be held for at least 30 seconds.  · A stretch should never be painful. You should only feel a gentle lengthening or release in the stretched tissue.  RANGE OF MOTION - Toe Extension, Flexion  · Sit with your right / left leg crossed over your opposite knee.  · Grasp your toes and gently pull them back toward the top of your foot. You should feel a stretch on the bottom of your toes and/or foot.  · Hold this stretch for __________ seconds.  · Now, gently pull your toes toward the bottom of your foot. You should feel a stretch on the top of your toes and or foot.  · Hold this stretch for __________ seconds.  Repeat __________ times. Complete this stretch __________ times per day.   RANGE OF MOTION - Ankle Dorsiflexion, Active Assisted  · Remove shoes and sit on a chair that is preferably not on a carpeted surface.  · Place right / left foot under knee. Extend your opposite leg for support.  · Keeping your heel down, slide your right / left foot back toward the chair until you feel a stretch at your ankle or calf. If you do not feel a stretch, slide your bottom forward to the edge of the chair, while still keeping your heel down.  · Hold this stretch for __________ seconds.  Repeat __________ times. Complete this stretch __________ times per day.   STRETCH  Gastroc, Standing  · Place hands on wall.  · Extend right / left leg, keeping the front knee somewhat bent.  · Slightly point your toes inward on your back foot.  · Keeping your right / left heel on the floor and your knee straight, shift your weight toward the wall, not allowing your back to  arch.  · You should feel a gentle stretch in the right / left calf. Hold this position for __________ seconds.  Repeat __________ times. Complete this stretch __________ times per day.  STRETCH  Soleus, Standing  · Place hands on wall.  · Extend right / left leg, keeping the other knee somewhat bent.  · Slightly point your toes inward on your back foot.  · Keep your right / left heel on the floor, bend your back knee, and slightly shift your weight over the back leg so that you feel a gentle stretch deep in your back calf.  · Hold this position for __________ seconds.  Repeat __________ times. Complete this stretch __________ times per day.  STRETCH  Gastrocsoleus, Standing   Note: This exercise can place a lot of stress on your foot and ankle. Please complete this exercise only if specifically instructed by your caregiver.   · Place the ball of your right / left foot on a step, keeping   your other foot firmly on the same step.  · Hold on to the wall or a rail for balance.  · Slowly lift your other foot, allowing your body weight to press your heel down over the edge of the step.  · You should feel a stretch in your right / left calf.  · Hold this position for __________ seconds.  · Repeat this exercise with a slight bend in your right / left knee.  Repeat __________ times. Complete this stretch __________ times per day.   STRENGTHENING EXERCISES - Plantar Fasciitis (Heel Spur Syndrome)   These exercises may help you when beginning to rehabilitate your injury. They may resolve your symptoms with or without further involvement from your physician, physical therapist or athletic trainer. While completing these exercises, remember:   · Muscles can gain both the endurance and the strength needed for everyday activities through controlled exercises.  · Complete these exercises as instructed by your physician, physical therapist or athletic trainer. Progress the resistance and repetitions only as guided.  STRENGTH - Towel  Curls  · Sit in a chair positioned on a non-carpeted surface.  · Place your foot on a towel, keeping your heel on the floor.  · Pull the towel toward your heel by only curling your toes. Keep your heel on the floor.  · If instructed by your physician, physical therapist or athletic trainer, add ____________________ at the end of the towel.  Repeat __________ times. Complete this exercise __________ times per day.  STRENGTH - Ankle Inversion  · Secure one end of a rubber exercise band/tubing to a fixed object (table, pole). Loop the other end around your foot just before your toes.  · Place your fists between your knees. This will focus your strengthening at your ankle.  · Slowly, pull your big toe up and in, making sure the band/tubing is positioned to resist the entire motion.  · Hold this position for __________ seconds.  · Have your muscles resist the band/tubing as it slowly pulls your foot back to the starting position.  Repeat __________ times. Complete this exercises __________ times per day.   Document Released: 09/08/2005 Document Revised: 12/01/2011 Document Reviewed: 12/21/2008  [employer]® Patient Information ©2014 [employer].

## 2014-03-22 ENCOUNTER — Other Ambulatory Visit: Payer: Self-pay | Admitting: Emergency Medicine

## 2014-03-22 DIAGNOSIS — R7309 Other abnormal glucose: Secondary | ICD-10-CM

## 2014-03-22 DIAGNOSIS — E782 Mixed hyperlipidemia: Secondary | ICD-10-CM

## 2014-03-23 ENCOUNTER — Other Ambulatory Visit (INDEPENDENT_AMBULATORY_CARE_PROVIDER_SITE_OTHER): Payer: BC Managed Care – PPO

## 2014-03-23 DIAGNOSIS — E782 Mixed hyperlipidemia: Secondary | ICD-10-CM

## 2014-03-23 DIAGNOSIS — R7309 Other abnormal glucose: Secondary | ICD-10-CM

## 2014-03-23 LAB — CBC WITH DIFFERENTIAL/PLATELET
Basophils Absolute: 0.1 10*3/uL (ref 0.0–0.1)
Basophils Relative: 1 % (ref 0–1)
Eosinophils Absolute: 0.2 10*3/uL (ref 0.0–0.7)
Eosinophils Relative: 2 % (ref 0–5)
HCT: 39.1 % (ref 36.0–46.0)
Hemoglobin: 13.6 g/dL (ref 12.0–15.0)
Lymphocytes Relative: 32 % (ref 12–46)
Lymphs Abs: 3.1 10*3/uL (ref 0.7–4.0)
MCH: 30.4 pg (ref 26.0–34.0)
MCHC: 34.8 g/dL (ref 30.0–36.0)
MCV: 87.3 fL (ref 78.0–100.0)
Monocytes Absolute: 0.6 10*3/uL (ref 0.1–1.0)
Monocytes Relative: 6 % (ref 3–12)
Neutro Abs: 5.7 10*3/uL (ref 1.7–7.7)
Neutrophils Relative %: 59 % (ref 43–77)
Platelets: 292 10*3/uL (ref 150–400)
RBC: 4.48 MIL/uL (ref 3.87–5.11)
RDW: 13.4 % (ref 11.5–15.5)
WBC: 9.7 10*3/uL (ref 4.0–10.5)

## 2014-03-23 LAB — BASIC METABOLIC PANEL WITH GFR
BUN: 19 mg/dL (ref 6–23)
CO2: 28 mEq/L (ref 19–32)
Calcium: 9.2 mg/dL (ref 8.4–10.5)
Chloride: 103 mEq/L (ref 96–112)
Creat: 0.79 mg/dL (ref 0.50–1.10)
GFR, Est African American: >89 mL/min
GFR, Est Non African American: 85 mL/min
Glucose, Bld: 82 mg/dL (ref 70–99)
Potassium: 3.7 mEq/L (ref 3.5–5.3)
Sodium: 140 mEq/L (ref 135–145)

## 2014-03-23 LAB — HEPATIC FUNCTION PANEL
ALT: 13 U/L (ref 0–35)
AST: 13 U/L (ref 0–37)
Albumin: 4.1 g/dL (ref 3.5–5.2)
Alkaline Phosphatase: 59 U/L (ref 39–117)
Bilirubin, Direct: 0.1 mg/dL (ref 0.0–0.3)
Indirect Bilirubin: 0.5 mg/dL (ref 0.2–1.2)
Total Bilirubin: 0.6 mg/dL (ref 0.2–1.2)
Total Protein: 6.4 g/dL (ref 6.0–8.3)

## 2014-03-23 LAB — HEMOGLOBIN A1C
Hgb A1c MFr Bld: 5.7 % — ABNORMAL HIGH (ref <5.7)
Mean Plasma Glucose: 117 mg/dL — ABNORMAL HIGH (ref <117)

## 2014-03-23 LAB — LIPID PANEL
Cholesterol: 192 mg/dL (ref 0–200)
HDL: 44 mg/dL (ref >39)
LDL Cholesterol: 117 mg/dL — ABNORMAL HIGH (ref 0–99)
Total CHOL/HDL Ratio: 4.4 Ratio
Triglycerides: 156 mg/dL — ABNORMAL HIGH (ref <150)
VLDL: 31 mg/dL (ref 0–40)

## 2014-03-28 ENCOUNTER — Encounter: Payer: Self-pay | Admitting: Emergency Medicine

## 2014-03-28 ENCOUNTER — Ambulatory Visit (INDEPENDENT_AMBULATORY_CARE_PROVIDER_SITE_OTHER): Payer: BC Managed Care – PPO | Admitting: Emergency Medicine

## 2014-03-28 VITALS — BP 128/90 | HR 70 | Temp 98.0°F | Resp 16 | Ht 66.25 in | Wt 173.0 lb

## 2014-03-28 DIAGNOSIS — I10 Essential (primary) hypertension: Secondary | ICD-10-CM

## 2014-03-28 DIAGNOSIS — L259 Unspecified contact dermatitis, unspecified cause: Secondary | ICD-10-CM

## 2014-03-28 DIAGNOSIS — L309 Dermatitis, unspecified: Secondary | ICD-10-CM

## 2014-03-28 DIAGNOSIS — Z0000 Encounter for general adult medical examination without abnormal findings: Secondary | ICD-10-CM

## 2014-03-28 DIAGNOSIS — Z1212 Encounter for screening for malignant neoplasm of rectum: Secondary | ICD-10-CM

## 2014-03-28 DIAGNOSIS — E782 Mixed hyperlipidemia: Secondary | ICD-10-CM

## 2014-03-28 MED ORDER — DEXAMETHASONE SODIUM PHOSPHATE 100 MG/10ML IJ SOLN
10.0000 mg | Freq: Once | INTRAMUSCULAR | Status: AC
  Administered 2014-03-28: 10 mg via INTRAMUSCULAR

## 2014-03-28 NOTE — Patient Instructions (Signed)
Contact Dermatitis °Contact dermatitis is a rash that happens when something touches the skin. You touched something that irritates your skin, or you have allergies to something you touched. °HOME CARE  °· Avoid the thing that caused your rash. °· Keep your rash away from hot water, soap, sunlight, chemicals, and other things that might bother it. °· Do not scratch your rash. °· You can take cool baths to help stop itching. °· Only take medicine as told by your doctor. °· Keep all doctor visits as told. °GET HELP RIGHT AWAY IF:  °· Your rash is not better after 3 days. °· Your rash gets worse. °· Your rash is puffy (swollen), tender, red, sore, or warm. °· You have problems with your medicine. °MAKE SURE YOU:  °· Understand these instructions. °· Will watch your condition. °· Will get help right away if you are not doing well or get worse. °Document Released: 07/06/2009 Document Revised: 12/01/2011 Document Reviewed: 02/11/2011 °[employer]® Patient Information ©2015 [employer]. This information is not intended to replace advice given to you by your health care provider. Make sure you discuss any questions you have with your health care provider. ° °

## 2014-03-28 NOTE — Progress Notes (Signed)
Subjective:    Patient ID: Natasha Fritz, female    DOB: 1961/06/07, 53 y.o.   MRN: [id number]  HPI Comments: She ran out of cholesterol vit and has not been able to work out as much with tennis elbow.   She has had poison on right armand face x 2 weeks.   WBC             9.7   03/23/2014 HGB            13.6   03/23/2014 HCT            39.1   03/23/2014 PLT             292   03/23/2014 GLUCOSE          82   03/23/2014 CHOL            192   03/23/2014 TRIG            156   03/23/2014 HDL              44   03/23/2014 LDLCALC         117   03/23/2014 ALT              13   03/23/2014 AST              13   03/23/2014 NA              140   03/23/2014 K               3.7   03/23/2014 CL              103   03/23/2014 CREATININE     0.79   03/23/2014 BUN              19   03/23/2014 CO2              28   03/23/2014 HGBA1C          5.7   03/23/2014   Hypertension  Migraine  Her past medical history is significant for hypertension.     Medication List       This list is accurate as of: 03/28/14 11:59 PM.  Always use your most recent med list.               ALPRAZolam 0.5 MG tablet  Commonly known as:  XANAX  Take 0.5 mg by mouth 2 (two) times daily as needed for anxiety.     aspirin 81 MG EC tablet  Take 81 mg by mouth daily.     cholecalciferol 1000 UNITS tablet  Commonly known as:  VITAMIN D  Take 1,000 Units by mouth daily.     Co-Enzyme Q-10 100 MG Caps  Take 1 capsule by mouth daily.     Fish Oil 1200 MG Caps  Take 1 capsule by mouth daily.     ibuprofen 200 MG tablet  Commonly known as:  ADVIL,MOTRIN  Take 3 tablets (600 mg total) by mouth every 6 (six) hours as needed. For pain     multivitamin tablet  Take 1 tablet by mouth daily.     naratriptan 2.5 MG tablet  Commonly known as:  AMERGE  Take 2.5 mg by mouth as needed. Take one (1) tablet at onset of headache; if returns or does not resolve, may repeat after 4 hours; do not exceed five (5) mg in 24 hours. Use w/3 20mg   prednisone      olmesartan 20 MG tablet  Commonly known as:  BENICAR  Take 20 mg by mouth daily.     predniSONE 20 MG tablet  Commonly known as:  DELTASONE  Take 60 mg by mouth daily as needed. For migraines; takes with Amerge at onset of migraine     Red Yeast Rice Extract 600 MG Caps  Take 1,200 mg by mouth daily.     valACYclovir 500 MG tablet  Commonly known as:  VALTREX  Take 500 mg by mouth daily as needed. For cold sores     vitamin C 1000 MG tablet  Take 1,000 mg by mouth as needed. In winter for colds       Allergies  Allergen Reactions  . Penicillins     REACTION: hives   Past Medical History  Diagnosis Date  . HTN (hypertension)   . PONV (postoperative nausea and vomiting)   . 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 Hyperlipidemia   . Anxiety   . Migraine   . HSV-1 infection    Past Surgical History  Procedure Laterality Date  . Tubal ligation    . Acl replacement    . Cervical disc surgery  2012  . Robotic assisted total hysterectomy  09/10/2012    Procedure: ROBOTIC ASSISTED TOTAL HYSTERECTOMY;  Surgeon: Elveria Royals, MD;  Location: [facility];  Service: Gynecology;  Laterality: N/A;  3 hrs.  . Bilateral salpingectomy  09/10/2012    Procedure: BILATERAL SALPINGECTOMY;  Surgeon: Elveria Royals, MD;  Location: [facility];  Service: Gynecology;  Laterality: Bilateral;  . Elbow surgery Right 01/2014   History  Substance Use Topics  . Smoking status: Never Smoker   . Smokeless tobacco: Never Used     Comment: tobacco use - no   . Alcohol Use: Yes     Comment: rare   Family History  Problem Relation Age of Onset  . Alzheimer's disease Father   . Hypertension Mother   . Stroke Mother   . Cancer Mother   . Hyperlipidemia Brother   . Heart disease Brother   . Diabetes Brother   . Hypertension Brother     MAINTENANCE: Colonoscopy:2011 Mammo:5/ 27/14 BMD:n/a Pap/ Pelvic:2013 wnl EYE:2014 wnl Dentist: q 6  month  IMMUNIZATIONS: Tdap:2008 Pneumovax: Zostavax: Influenza: 2014 at work  Patient Care Team: Unk Pinto, MD as PCP - General (Internal Medicine) Lorn Junes, MD as Consulting Physician (Orthopedic Surgery) Johna Sheriff, MD as Consulting Physician (Ophthalmology) Fay Records, MD as Consulting Physician (Cardiology) Inda Castle, MD as Consulting Physician (Gastroenterology) Elveria Royals, MD as Consulting Physician (Obstetrics and Gynecology) Floyce Stakes, MD as Consulting Physician (Neurosurgery) Simona Huh, MD as Consulting Physician (Dermatology)  [place], (Dentist)   Review of Systems BP 128/90  Pulse 70  Temp(Src) 98 F (36.7 C) (Temporal)  Resp 16  Ht 5' 6.25" (1.683 m)  Wt 173 lb (78.472 kg)  BMI 27.70 kg/m2  LMP 09/05/2012     Objective:   Physical Exam  Nursing note and vitals reviewed. Constitutional: She is oriented to person, place, and time. She appears well-developed and well-nourished. No distress.  HENT:  Head: Normocephalic and atraumatic.  Right Ear: External ear normal.  Left Ear: External ear normal.  Nose: Nose normal.  Mouth/Throat: Oropharynx is clear and moist.  Eyes: Conjunctivae and EOM are normal. Pupils are equal, round, and reactive to light. Right eye exhibits no discharge. Left eye  exhibits no discharge. No scleral icterus.  Neck: Normal range of motion. Neck supple. No JVD present. No tracheal deviation present. No thyromegaly present.  Cardiovascular: Normal rate, regular rhythm, normal heart sounds and intact distal pulses.   Pulmonary/Chest: Effort normal and breath sounds normal.  Abdominal: Soft. Bowel sounds are normal. She exhibits no distension and no mass. There is no tenderness. There is no rebound and no guarding.  Genitourinary:  Def gyn  Musculoskeletal: Normal range of motion. She exhibits no edema and no tenderness.  Lymphadenopathy:    She has no cervical adenopathy.   Neurological: She is alert and oriented to person, place, and time. She has normal reflexes. No cranial nerve deficit. She exhibits normal muscle tone. Coordination normal.  Skin: Skin is warm and dry. No rash noted. No erythema. No pallor.  Splotchy erythematous patches right forehead and hands. Forearms with 3 mm erythematous papules scattered   Psychiatric: She has a normal mood and affect. Her behavior is normal. Judgment and thought content normal.    AORTA SCAN WNL EKG NSCSPT       Assessment & Plan:  1. CPE- Update screening labs/ History/ Immunizations/ Testing as needed. Advised healthy diet, QD exercise, increase H20 and continue RX/ Vitamins AD.   2.  3 month F/U for HTN, Cholesterol, Pre-Dm, D. Deficient. Needs healthy diet, cardio QD and obtain healthy weight. Check Labs, Check BP if >130/80 call office   3. Dermatitis- ? RHUS vs contact- Decadron 10 mg IM, OTC Benadryl, hygiene explained, call if no relief for oral prednisone

## 2014-03-29 LAB — URINALYSIS, ROUTINE W REFLEX MICROSCOPIC
Bilirubin Urine: NEGATIVE
Glucose, UA: NEGATIVE mg/dL
Hgb urine dipstick: NEGATIVE
Ketones, ur: NEGATIVE mg/dL
Leukocytes, UA: NEGATIVE
Nitrite: NEGATIVE
Protein, ur: NEGATIVE mg/dL
Specific Gravity, Urine: 1.016 (ref 1.005–1.030)
Urobilinogen, UA: 0.2 mg/dL (ref 0.0–1.0)
pH: 7 (ref 5.0–8.0)

## 2014-03-30 ENCOUNTER — Encounter: Payer: Self-pay | Admitting: Emergency Medicine

## 2014-04-19 ENCOUNTER — Encounter: Payer: Self-pay | Admitting: *Deleted

## 2014-06-21 ENCOUNTER — Other Ambulatory Visit: Payer: Self-pay | Admitting: Internal Medicine

## 2014-06-21 MED ORDER — PREDNISONE 20 MG PO TABS: 60.0000 mg | ORAL_TABLET | Freq: Every day | ORAL | Status: DC | PRN

## 2014-06-21 MED ORDER — NARATRIPTAN HCL 2.5 MG PO TABS: 2.5000 mg | ORAL_TABLET | ORAL | Status: DC | PRN

## 2014-06-27 ENCOUNTER — Other Ambulatory Visit: Payer: Self-pay | Admitting: Physician Assistant

## 2014-06-28 ENCOUNTER — Other Ambulatory Visit: Payer: Self-pay | Admitting: *Deleted

## 2014-06-28 MED ORDER — VALSARTAN 320 MG PO TABS: 320.0000 mg | ORAL_TABLET | Freq: Every day | ORAL | Status: DC

## 2014-07-06 ENCOUNTER — Encounter: Payer: Self-pay | Admitting: Internal Medicine

## 2014-07-13 ENCOUNTER — Other Ambulatory Visit: Payer: Self-pay | Admitting: Internal Medicine

## 2014-07-13 DIAGNOSIS — Z1231 Encounter for screening mammogram for malignant neoplasm of breast: Secondary | ICD-10-CM

## 2014-07-20 ENCOUNTER — Ambulatory Visit (HOSPITAL_COMMUNITY)
Admission: RE | Admit: 2014-07-20 | Discharge: 2014-07-20 | Disposition: A | Discharge status: Home / Self Care | Payer: BC Managed Care – PPO | Source: Ambulatory Visit | Attending: Internal Medicine | Admitting: Internal Medicine

## 2014-07-20 DIAGNOSIS — Z1231 Encounter for screening mammogram for malignant neoplasm of breast: Secondary | ICD-10-CM | POA: Diagnosis not present

## 2014-08-04 ENCOUNTER — Encounter: Payer: Self-pay | Admitting: Physician Assistant

## 2014-08-04 ENCOUNTER — Ambulatory Visit (INDEPENDENT_AMBULATORY_CARE_PROVIDER_SITE_OTHER): Payer: BC Managed Care – PPO | Admitting: Physician Assistant

## 2014-08-04 VITALS — BP 138/72 | HR 68 | Temp 97.7°F | Resp 16 | Ht 66.25 in | Wt 178.0 lb

## 2014-08-04 DIAGNOSIS — M21612 Bunion of left foot: Secondary | ICD-10-CM

## 2014-08-04 DIAGNOSIS — R7309 Other abnormal glucose: Secondary | ICD-10-CM | POA: Diagnosis not present

## 2014-08-04 DIAGNOSIS — Z79899 Other long term (current) drug therapy: Secondary | ICD-10-CM

## 2014-08-04 DIAGNOSIS — R7303 Prediabetes: Secondary | ICD-10-CM

## 2014-08-04 DIAGNOSIS — M722 Plantar fascial fibromatosis: Secondary | ICD-10-CM

## 2014-08-04 DIAGNOSIS — I10 Essential (primary) hypertension: Secondary | ICD-10-CM

## 2014-08-04 DIAGNOSIS — M26609 Unspecified temporomandibular joint disorder, unspecified side: Secondary | ICD-10-CM

## 2014-08-04 DIAGNOSIS — E785 Hyperlipidemia, unspecified: Secondary | ICD-10-CM | POA: Diagnosis not present

## 2014-08-04 LAB — MAGNESIUM: Magnesium: 2.3 mg/dL (ref 1.5–2.5)

## 2014-08-04 LAB — BASIC METABOLIC PANEL WITH GFR
BUN: 23 mg/dL (ref 6–23)
CO2: 26 mEq/L (ref 19–32)
Calcium: 10.2 mg/dL (ref 8.4–10.5)
Chloride: 103 mEq/L (ref 96–112)
Creat: 0.81 mg/dL (ref 0.50–1.10)
GFR, Est African American: >89 mL/min
GFR, Est Non African American: 83 mL/min
Glucose, Bld: 92 mg/dL (ref 70–99)
Potassium: 4.4 mEq/L (ref 3.5–5.3)
Sodium: 143 mEq/L (ref 135–145)

## 2014-08-04 LAB — CBC WITH DIFFERENTIAL/PLATELET
Basophils Absolute: 0 10*3/uL (ref 0.0–0.1)
Basophils Relative: 0 % (ref 0–1)
Eosinophils Absolute: 0.1 10*3/uL (ref 0.0–0.7)
Eosinophils Relative: 1 % (ref 0–5)
HCT: 41 % (ref 36.0–46.0)
Hemoglobin: 14.1 g/dL (ref 12.0–15.0)
Lymphocytes Relative: 25 % (ref 12–46)
Lymphs Abs: 2.1 10*3/uL (ref 0.7–4.0)
MCH: 30.8 pg (ref 26.0–34.0)
MCHC: 34.4 g/dL (ref 30.0–36.0)
MCV: 89.5 fL (ref 78.0–100.0)
Monocytes Absolute: 0.6 10*3/uL (ref 0.1–1.0)
Monocytes Relative: 7 % (ref 3–12)
Neutro Abs: 5.7 10*3/uL (ref 1.7–7.7)
Neutrophils Relative %: 67 % (ref 43–77)
Platelets: 299 10*3/uL (ref 150–400)
RBC: 4.58 MIL/uL (ref 3.87–5.11)
RDW: 13.1 % (ref 11.5–15.5)
WBC: 8.5 10*3/uL (ref 4.0–10.5)

## 2014-08-04 LAB — LIPID PANEL
Cholesterol: 201 mg/dL — ABNORMAL HIGH (ref 0–200)
HDL: 43 mg/dL (ref >39)
LDL Cholesterol: 107 mg/dL — ABNORMAL HIGH (ref 0–99)
Total CHOL/HDL Ratio: 4.7 Ratio
Triglycerides: 256 mg/dL — ABNORMAL HIGH (ref <150)
VLDL: 51 mg/dL — ABNORMAL HIGH (ref 0–40)

## 2014-08-04 LAB — HEPATIC FUNCTION PANEL
ALT: 20 U/L (ref 0–35)
AST: 18 U/L (ref 0–37)
Albumin: 4.3 g/dL (ref 3.5–5.2)
Alkaline Phosphatase: 65 U/L (ref 39–117)
Bilirubin, Direct: 0.1 mg/dL (ref 0.0–0.3)
Indirect Bilirubin: 0.4 mg/dL (ref 0.2–1.2)
Total Bilirubin: 0.5 mg/dL (ref 0.2–1.2)
Total Protein: 7.1 g/dL (ref 6.0–8.3)

## 2014-08-04 LAB — HEMOGLOBIN A1C
Hgb A1c MFr Bld: 5.8 % — ABNORMAL HIGH (ref <5.7)
Mean Plasma Glucose: 120 mg/dL — ABNORMAL HIGH (ref <117)

## 2014-08-04 LAB — TSH: TSH: 2.197 u[IU]/mL (ref 0.350–4.500)

## 2014-08-04 MED ORDER — ALPRAZOLAM 0.5 MG PO TABS: 0.5000 mg | ORAL_TABLET | Freq: Two times a day (BID) | ORAL | Status: DC | PRN

## 2014-08-04 MED ORDER — CYCLOBENZAPRINE HCL 10 MG PO TABS: 10.0000 mg | ORAL_TABLET | Freq: Every day | ORAL | Status: AC

## 2014-08-04 NOTE — Patient Instructions (Signed)
Benefiber is good for constipation/diarrhea/irritable bowel syndrome, it helps with weight loss and can help lower your bad cholesterol. Please do 1-2 TBSP in the morning in water, coffee, or tea. It can take up to a month before you can see a difference with your bowel movements. It is cheapest from [employer], [employer], [employer].   What is the TMJ? The temporomandibular (tem-PUH-ro-man-DIB-yoo-ler) joint, or the TMJ, connects the upper and lower jawbones. This joint allows the jaw to open wide and move back and forth when you chew, talk, or yawn.There are also several muscles that help this joint move. There can be muscle tightness and pain in the muscle that can cause several symptoms.  What causes TMJ pain? There are many causes of TMJ pain. Repeated chewing (for example, chewing gum) and clenching your teeth can cause pain in the joint. Some TMJ pain has no obvious cause. What can I do to ease the pain? There are many things you can do to help your pain get better. When you have pain:  Eat soft foods and stay away from chewy foods (for example, taffy) Try to use both sides of your mouth to chew Don't chew gum Don't open your mouth wide (for example, during yawning or singing) Don't bite your cheeks or fingernails Lower your amount of stress and worry Applying a warm, damp washcloth to the joint may help. Over-the-counter pain medicines such as ibuprofen (one brand: Advil) or acetaminophen (one brand: Tylenol) might also help. Do not use these medicines if you are allergic to them or if your doctor told you not to use them. How can I stop the pain from coming back? When your pain is better, you can do these exercises to make your muscles stronger and to keep the pain from coming back:  Resisted mouth opening: Place your thumb or two fingers under your chin and open your mouth slowly, pushing up lightly on your chin with your thumb. Hold for three to six seconds. Close your mouth slowly. Resisted  mouth closing: Place your thumbs under your chin and your two index fingers on the ridge between your mouth and the bottom of your chin. Push down lightly on your chin as you close your mouth. Tongue up: Slowly open and close your mouth while keeping the tongue touching the roof of the mouth. Side-to-side jaw movement: Place an object about one fourth of an inch thick (for example, two tongue depressors) between your front teeth. Slowly move your jaw from side to side. Increase the thickness of the object as the exercise becomes easier Forward jaw movement: Place an object about one fourth of an inch thick between your front teeth and move the bottom jaw forward so that the bottom teeth are in front of the top teeth. Increase the thickness of the object as the exercise becomes easier. These exercises should not be painful. If it hurts to do these exercises, stop doing them and talk to your family doctor.

## 2014-08-04 NOTE — Progress Notes (Signed)
Assessment and Plan:  Hypertension: Continue medication, monitor blood pressure at home. Continue DASH diet.  Reminder to go to the ER if any CP, SOB, nausea, dizziness, severe HA, changes vision/speech, left arm numbness and tingling, and jaw pain. Cholesterol: Continue diet and exercise. Check cholesterol.  Pre-diabetes-Continue diet and exercise. Check A1C Vitamin D Def- check level and continue medications.  Plantar Faciitis- Conservative treatment, night time orthotics, arch support, RICE, NSAID, stretches given Bunion- referral to podiatry made GERD- Continue given PPI diet discussed, take NSAIDS with food and try to use spareingly TMJ-information given to the patient, no gum/decrease hard foods, warm wet wash clothes, decrease stress, talk with dentist about possible night guard, can do massage, and exercise. Can be contributing to migraines.   OVER 40 minutes of exam, counseling, chart review, referral performed Continue diet and meds as discussed. Further disposition pending results of labs.  HPI 53 y.o. female  presents for 3 month follow up with hypertension, hyperlipidemia, prediabetes and vitamin D. Her blood pressure has been controlled at home, she just switched from benicar to valsartan for 1 week, today their BP is BP: 138/72 mmHg She does not workout, due to bilateral feet pain, right is worse than left. She denies chest pain, shortness of breath, dizziness.  She is not on cholesterol medication and denies myalgias. Her cholesterol is at goal. The cholesterol last visit was:   Lab Results  Component Value Date   CHOL 192 03/23/2014   HDL 44 03/23/2014   LDLCALC 117* 03/23/2014   TRIG 156* 03/23/2014   CHOLHDL 4.4 03/23/2014   She has been working on diet and exercise for prediabetes, and denies paresthesia of the feet, polydipsia, polyuria and visual disturbances. Last A1C in the office was:  Lab Results  Component Value Date   HGBA1C 5.7* 03/23/2014   Patient is on  Vitamin D supplement.   Bunion on right medial foot and heel pain, she would like to see podiatrist.  She has had migraines all this week, taking prednisone, feels stabbing pain through left eye. Was better after hysterectomy but they have started to become more frequent, happenning once a month and since the BP switch has happened every day for the past week but she also works for [employer] and has been having increased stress at work. Denies dizziness, changes in vision/speech, imbalance, weakness.  She takes ibuprofen daily for joint pain/headaches and has been having dry cough after food for past month.   Current Medications:  Current Outpatient Prescriptions on File Prior to Visit  Medication Sig Dispense Refill  . ALPRAZolam (XANAX) 0.5 MG tablet Take 0.5 mg by mouth 2 (two) times daily as needed for anxiety.    . Ascorbic Acid (VITAMIN C) 1000 MG tablet Take 1,000 mg by mouth as needed. In winter for colds    . aspirin 81 MG EC tablet Take 81 mg by mouth daily.      . cholecalciferol (VITAMIN D) 1000 UNITS tablet Take 1,000 Units by mouth daily.    [name] Co-Enzyme Q-10 100 MG CAPS Take 1 capsule by mouth daily.    [name] ibuprofen (ADVIL,MOTRIN) 200 MG tablet Take 3 tablets (600 mg total) by mouth every 6 (six) hours as needed. For pain 30 tablet 0  . Multiple Vitamin (MULTIVITAMIN) tablet Take 1 tablet by mouth daily.    . naratriptan (AMERGE) 2.5 MG tablet Take 1 tablet (2.5 mg total) by mouth as needed. Take one (1) tablet at onset of headache; if returns or  does not resolve, may repeat after 4 hours; do not exceed five (5) mg in 24 hours. Use w/3 20mg  prednisone 10 tablet 3  . Omega-3 Fatty Acids (FISH OIL) 1200 MG CAPS Take 1 capsule by mouth daily.    . predniSONE (DELTASONE) 20 MG tablet Take 3 tablets (60 mg total) by mouth daily as needed. For migraines; takes with Amerge at onset of migraine 12 tablet 0  . Red Yeast Rice Extract 600 MG CAPS Take 1,200 mg by mouth daily.    . valACYclovir  (VALTREX) 500 MG tablet Take 500 mg by mouth daily as needed. For cold sores    . valsartan (DIOVAN) 320 MG tablet Take 1 tablet (320 mg total) by mouth daily with breakfast. For BP 90 tablet 0   No current facility-administered medications on file prior to visit.   Medical History:  Past Medical History  Diagnosis Date  . HTN (hypertension)   . PONV (postoperative nausea and vomiting)   . 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 Hyperlipidemia   . Anxiety   . Migraine   . HSV-1 infection    Allergies:  Allergies  Allergen Reactions  . Penicillins     REACTION: hives   Review of Systems: see HPI  Family history- Review and unchanged Social history- Review and unchanged Physical Exam: BP 138/72 mmHg  Pulse 68  Temp(Src) 97.7 F (36.5 C)  Resp 16  Ht 5' 6.25" (1.683 m)  Wt 178 lb (80.74 kg)  BMI 28.50 kg/m2  LMP 09/05/2012 Wt Readings from Last 3 Encounters:  08/04/14 178 lb (80.74 kg)  03/28/14 173 lb (78.472 kg)  12/21/13 165 lb (74.844 kg)   General Appearance: Well nourished, in no apparent distress. Eyes: PERRLA, EOMs, conjunctiva no swelling or erythema Sinuses: No Frontal/maxillary tenderness ENT/Mouth: Ext aud canals clear, TMs without erythema, bulging. No erythema, swelling, or exudate on post pharynx.  Tonsils not swollen or erythematous. Hearing normal. + TMJ tenderness Neck: Supple, thyroid normal.  Respiratory: Respiratory effort normal, BS equal bilaterally without rales, rhonchi, wheezing or stridor.  Cardio: RRR with no MRGs. Brisk peripheral pulses without edema.  Abdomen: Soft, + BS.  Non tender, no guarding, rebound, hernias, masses. Lymphatics: Non tender without lymphadenopathy.  Musculoskeletal: Full ROM, 5/5 strength, normal gait. Good pulses, good sensation bilaterally. Right heel pain, worse with dorsiflexion, + bunion Skin: Warm, dry without rashes, lesions, ecchymosis.  Neuro: Cranial nerves intact. Normal muscle tone,  no cerebellar symptoms. Sensation intact.  Psych: Awake and oriented X 3, normal affect, Insight and Judgment appropriate.    [name], PA-C 9:11 AM [facility]

## 2014-08-14 ENCOUNTER — Ambulatory Visit (INDEPENDENT_AMBULATORY_CARE_PROVIDER_SITE_OTHER): Payer: BC Managed Care – PPO

## 2014-08-14 ENCOUNTER — Encounter: Payer: Self-pay | Admitting: Podiatry

## 2014-08-14 ENCOUNTER — Ambulatory Visit (INDEPENDENT_AMBULATORY_CARE_PROVIDER_SITE_OTHER): Payer: BC Managed Care – PPO | Admitting: Podiatry

## 2014-08-14 VITALS — BP 125/78 | HR 75 | Resp 17

## 2014-08-14 DIAGNOSIS — M2011 Hallux valgus (acquired), right foot: Secondary | ICD-10-CM

## 2014-08-14 DIAGNOSIS — R52 Pain, unspecified: Secondary | ICD-10-CM

## 2014-08-14 DIAGNOSIS — M722 Plantar fascial fibromatosis: Secondary | ICD-10-CM

## 2014-08-14 DIAGNOSIS — M21611 Bunion of right foot: Secondary | ICD-10-CM

## 2014-08-14 NOTE — Patient Instructions (Signed)
Bunion You have a bunion deformity of the feet. This is more common in women. It tends to be an inherited problem. Symptoms can include pain, swelling, and deformity around the great toe. Numbness and tingling may also be present. Your symptoms are often worsened by wearing shoes that cause pressure on the bunion. Changing the type of shoes you wear helps reduce symptoms. A wide shoe decreases pressure on the bunion. An arch support may be used if you have flat feet. Avoid shoes with heels higher than two inches. This puts more pressure on the bunion. X-rays may be helpful in evaluating the severity of the problem. Other foot problems often seen with bunions include corns, calluses, and hammer toes. If the deformity or pain is severe, surgical treatment may be necessary. Keep off your painful foot as much as possible until the pain is relieved. Call your caregiver if your symptoms are worse.  SEEK IMMEDIATE MEDICAL CARE IF:  You have increased redness, pain, swelling, or other symptoms of infection. Document Released: 09/08/2005 Document Revised: 12/01/2011 Document Reviewed: 03/08/2007 [facility] Patient Information 2015 [place], [place]. This information is not intended to replace advice given to you by your health care provider. Make sure you discuss any questions you have with your health care provider. Plantar Fasciitis (Heel Spur Syndrome) with Rehab The plantar fascia is a fibrous, ligament-like, soft-tissue structure that spans the bottom of the foot. Plantar fasciitis is a condition that causes pain in the foot due to inflammation of the tissue. SYMPTOMS   Pain and tenderness on the underneath side of the foot.  Pain that worsens with standing or walking. CAUSES  Plantar fasciitis is caused by irritation and injury to the plantar fascia on the underneath side of the foot. Common mechanisms of injury include:  Direct trauma to bottom of the foot.  Damage to a small nerve that runs under the  foot where the main fascia attaches to the heel bone.  Stress placed on the plantar fascia due to bone spurs. RISK INCREASES WITH:   Activities that place stress on the plantar fascia (running, jumping, pivoting, or cutting).  Poor strength and flexibility.  Improperly fitted shoes.  Tight calf muscles.  Flat feet.  Failure to warm-up properly before activity.  Obesity. PREVENTION  Warm up and stretch properly before activity.  Allow for adequate recovery between workouts.  Maintain physical fitness:  Strength, flexibility, and endurance.  Cardiovascular fitness.  Maintain a health body weight.  Avoid stress on the plantar fascia.  Wear properly fitted shoes, including arch supports for individuals who have flat feet. PROGNOSIS  If treated properly, then the symptoms of plantar fasciitis usually resolve without surgery. However, occasionally surgery is necessary. RELATED COMPLICATIONS   Recurrent symptoms that may result in a chronic condition.  Problems of the lower back that are caused by compensating for the injury, such as limping.  Pain or weakness of the foot during push-off following surgery.  Chronic inflammation, scarring, and partial or complete fascia tear, occurring more often from repeated injections. TREATMENT  Treatment initially involves the use of ice and medication to help reduce pain and inflammation. The use of strengthening and stretching exercises may help reduce pain with activity, especially stretches of the Achilles tendon. These exercises may be performed at home or with a therapist. Your caregiver may recommend that you use heel cups of arch supports to help reduce stress on the plantar fascia. Occasionally, corticosteroid injections are given to reduce inflammation. If symptoms persist for greater  than 6 months despite non-surgical (conservative), then surgery may be recommended.  MEDICATION   If pain medication is necessary, then  nonsteroidal anti-inflammatory medications, such as aspirin and ibuprofen, or other minor pain relievers, such as acetaminophen, are often recommended.  Do not take pain medication within 7 days before surgery.  Prescription pain relievers may be given if deemed necessary by your caregiver. Use only as directed and only as much as you need.  Corticosteroid injections may be given by your caregiver. These injections should be reserved for the most serious cases, because they may only be given a certain number of times. HEAT AND COLD  Cold treatment (icing) relieves pain and reduces inflammation. Cold treatment should be applied for 10 to 15 minutes every 2 to 3 hours for inflammation and pain and immediately after any activity that aggravates your symptoms. Use ice packs or massage the area with a piece of ice (ice massage).  Heat treatment may be used prior to performing the stretching and strengthening activities prescribed by your caregiver, physical therapist, or athletic trainer. Use a heat pack or soak the injury in warm water. SEEK IMMEDIATE MEDICAL CARE IF:  Treatment seems to offer no benefit, or the condition worsens.  Any medications produce adverse side effects. EXERCISES RANGE OF MOTION (ROM) AND STRETCHING EXERCISES - Plantar Fasciitis (Heel Spur Syndrome) These exercises may help you when beginning to rehabilitate your injury. Your symptoms may resolve with or without further involvement from your physician, physical therapist or athletic trainer. While completing these exercises, remember:   Restoring tissue flexibility helps normal motion to return to the joints. This allows healthier, less painful movement and activity.  An effective stretch should be held for at least 30 seconds.  A stretch should never be painful. You should only feel a gentle lengthening or release in the stretched tissue. RANGE OF MOTION - Toe Extension, Flexion  Sit with your right / left leg crossed  over your opposite knee.  Grasp your toes and gently pull them back toward the top of your foot. You should feel a stretch on the bottom of your toes and/or foot.  Hold this stretch for __________ seconds.  Now, gently pull your toes toward the bottom of your foot. You should feel a stretch on the top of your toes and or foot.  Hold this stretch for __________ seconds. Repeat __________ times. Complete this stretch __________ times per day.  RANGE OF MOTION - Ankle Dorsiflexion, Active Assisted  Remove shoes and sit on a chair that is preferably not on a carpeted surface.  Place right / left foot under knee. Extend your opposite leg for support.  Keeping your heel down, slide your right / left foot back toward the chair until you feel a stretch at your ankle or calf. If you do not feel a stretch, slide your bottom forward to the edge of the chair, while still keeping your heel down.  Hold this stretch for __________ seconds. Repeat __________ times. Complete this stretch __________ times per day.  STRETCH - Gastroc, Standing  Place hands on wall.  Extend right / left leg, keeping the front knee somewhat bent.  Slightly point your toes inward on your back foot.  Keeping your right / left heel on the floor and your knee straight, shift your weight toward the wall, not allowing your back to arch.  You should feel a gentle stretch in the right / left calf. Hold this position for __________ seconds. Repeat __________ times.  Complete this stretch __________ times per day. STRETCH - Soleus, Standing  Place hands on wall.  Extend right / left leg, keeping the other knee somewhat bent.  Slightly point your toes inward on your back foot.  Keep your right / left heel on the floor, bend your back knee, and slightly shift your weight over the back leg so that you feel a gentle stretch deep in your back calf.  Hold this position for __________ seconds. Repeat __________ times. Complete  this stretch __________ times per day. STRETCH - Gastrocsoleus, Standing  Note: This exercise can place a lot of stress on your foot and ankle. Please complete this exercise only if specifically instructed by your caregiver.   Place the ball of your right / left foot on a step, keeping your other foot firmly on the same step.  Hold on to the wall or a rail for balance.  Slowly lift your other foot, allowing your body weight to press your heel down over the edge of the step.  You should feel a stretch in your right / left calf.  Hold this position for __________ seconds.  Repeat this exercise with a slight bend in your right / left knee. Repeat __________ times. Complete this stretch __________ times per day.  STRENGTHENING EXERCISES - Plantar Fasciitis (Heel Spur Syndrome)  These exercises may help you when beginning to rehabilitate your injury. They may resolve your symptoms with or without further involvement from your physician, physical therapist or athletic trainer. While completing these exercises, remember:   Muscles can gain both the endurance and the strength needed for everyday activities through controlled exercises.  Complete these exercises as instructed by your physician, physical therapist or athletic trainer. Progress the resistance and repetitions only as guided. STRENGTH - Towel Curls  Sit in a chair positioned on a non-carpeted surface.  Place your foot on a towel, keeping your heel on the floor.  Pull the towel toward your heel by only curling your toes. Keep your heel on the floor.  If instructed by your physician, physical therapist or athletic trainer, add ____________________ at the end of the towel. Repeat __________ times. Complete this exercise __________ times per day. STRENGTH - Ankle Inversion  Secure one end of a rubber exercise band/tubing to a fixed object (table, pole). Loop the other end around your foot just before your toes.  Place your fists  between your knees. This will focus your strengthening at your ankle.  Slowly, pull your big toe up and in, making sure the band/tubing is positioned to resist the entire motion.  Hold this position for __________ seconds.  Have your muscles resist the band/tubing as it slowly pulls your foot back to the starting position. Repeat __________ times. Complete this exercises __________ times per day.  Document Released: 09/08/2005 Document Revised: 12/01/2011 Document Reviewed: 12/21/2008 [facility] Patient Information 2015 [place], [place]. This information is not intended to replace advice given to you by your health care provider. Make sure you discuss any questions you have with your health care provider.

## 2014-08-14 NOTE — Progress Notes (Signed)
   Subjective:    Patient ID: Natasha Fritz, female    DOB: [birth year], 53 y.o.   MRN: [id number]  HPI 53 year old female presents the office today with complaints of bilateral foot pain with a right greater than left. She states that she has pain to both of her heels which is been ongoing for "a few months". She has been icing the area as well as stretching which seems to help decrease some of her symptoms. She denies any recent injury or trauma to the area. Also states she has a painful bunion on her right foot. She states that this area has become increasing painful over the last couple months particularly with certain shoe gear. She is also tried padding the area. No other complaints at this time.   Review of Systems  Allergic/Immunologic: Positive for environmental allergies.  All other systems reviewed and are negative.      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 There is no pain or crepitation with first MTPJ range of motion and are slight lateral deviation of the hallux. No significant hypermobility present.  There is tenderness palpation overlying the plantar medial tubercle bilateral calcaneus at the insertion of the plantar fascia. There is no pain with lateral compression of the calcaneus or with vibratory sensation. No pain along the posterior aspect of the calcaneus or along the course/insertion of the Achilles tendon. No pain along the course of the plantar fascia within the arch of the foot. No overlying edema, erythema, increase in warmth. MMT 5/5, ROM WNL No pain with calf compression, swelling, warmth, erythema. No open lesions or pre-ulcerative lesions.       Assessment & Plan:  53 year old female with bilateral plantar fasciitis, right foot  symptomatic HAV -X-rays were obtained and reviewed with the patient. -Conservative versus surgical options were discussed including alternatives, risks, complications. -For the bunion deformity discussed a wider toe box shoe, padding, offloading, possible injections in the future if there is any increased inflammation. Also discussed surgical correction of the bunion. At this time the patient elects to proceed with conservative treatment and shoe gear modifications. -For bilateral plantar fasciitis patient wishes to hold off on the steroid injection at this time. Continue stretching exercises as well as ice to the area. Dispensed plantar fascial braces. -Follow-up in 3 weeks. In the meantime, call the office with any questions, concerns, change in symptoms. If symptoms worsen for the plantar fasciitis will likely inject the next appointment.

## 2014-08-16 ENCOUNTER — Encounter: Payer: Self-pay | Admitting: Physician Assistant

## 2014-08-16 ENCOUNTER — Ambulatory Visit (INDEPENDENT_AMBULATORY_CARE_PROVIDER_SITE_OTHER): Payer: BC Managed Care – PPO | Admitting: Physician Assistant

## 2014-08-16 VITALS — BP 126/88 | HR 80 | Temp 97.8°F | Resp 16 | Ht 66.5 in | Wt 178.0 lb

## 2014-08-16 DIAGNOSIS — J0100 Acute maxillary sinusitis, unspecified: Secondary | ICD-10-CM

## 2014-08-16 DIAGNOSIS — J029 Acute pharyngitis, unspecified: Secondary | ICD-10-CM

## 2014-08-16 LAB — POC INFLUENZA A&B (BINAX/QUICKVUE)
Influenza A, POC: NEGATIVE
Influenza B, POC: NEGATIVE

## 2014-08-16 MED ORDER — AZITHROMYCIN 250 MG PO TABS: ORAL_TABLET | ORAL | Status: AC

## 2014-08-16 MED ORDER — PREDNISONE 20 MG PO TABS: ORAL_TABLET | ORAL | Status: AC

## 2014-08-16 MED ORDER — BENZONATATE 100 MG PO CAPS
100.0000 mg | ORAL_CAPSULE | Freq: Four times a day (QID) | ORAL | Status: AC | PRN
Start: 2014-08-16 — End: 2014-09-27

## 2014-08-16 MED ORDER — FLUTICASONE PROPIONATE 50 MCG/ACT NA SUSP: 2.0000 | Freq: Every day | NASAL | Status: DC

## 2014-08-16 NOTE — Progress Notes (Signed)
Subjective:    Patient ID: Natasha Fritz, female    DOB: [birth year], 53 y.o.   MRN: [id number]  Sore Throat  This is a new problem. Episode onset: Tuesday during the night. The problem has been gradually worsening. Maximum temperature: 99.5 a couple days ago. Associated symptoms include congestion, coughing, ear pain, headaches and trouble swallowing. Pertinent negatives include no abdominal pain, diarrhea, ear discharge, shortness of breath or vomiting. Exposure to: A co-worker at work was diagnosed with flu.. Treatments tried: cold medication and neti pot. The treatment provided no relief.  GFR= 83 on 08/04/14. Review of Systems  Constitutional: Positive for fever, chills, diaphoresis, appetite change and fatigue.       Low appetitie  HENT: Positive for congestion, ear pain, postnasal drip, rhinorrhea, sinus pressure, sore throat, trouble swallowing and voice change. Negative for ear discharge.        Nasal congestion  Eyes: Negative.   Respiratory: Positive for cough. Negative for chest tightness, shortness of breath and wheezing.        Non productive cough  Cardiovascular: Negative.   Gastrointestinal: Positive for nausea. Negative for vomiting, abdominal pain, diarrhea and constipation.  Genitourinary: Negative.   Musculoskeletal:       Muscle aches  Skin: Negative.  Negative for rash.  Neurological: Positive for headaches. Negative for dizziness and light-headedness.  Psychiatric/Behavioral: Negative.    Past Medical History  Diagnosis Date  . HTN (hypertension)   . PONV (postoperative nausea and vomiting)   . 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 Hyperlipidemia   . Anxiety   . Migraine   . HSV-1 infection    Current Outpatient Prescriptions on File Prior to Visit  Medication Sig Dispense Refill  . ALPRAZolam (XANAX) 0.5 MG tablet Take 1 tablet (0.5 mg total) by mouth 2 (two) times daily as needed for anxiety. 60 tablet 0  . Ascorbic Acid (VITAMIN C)  1000 MG tablet Take 1,000 mg by mouth as needed. In winter for colds    . aspirin 81 MG EC tablet Take 81 mg by mouth daily.      . cholecalciferol (VITAMIN D) 1000 UNITS tablet Take 1,000 Units by mouth daily.    [name] Co-Enzyme Q-10 100 MG CAPS Take 1 capsule by mouth daily.    . cyclobenzaprine (FLEXERIL) 10 MG tablet Take 1 tablet (10 mg total) by mouth at bedtime. 30 tablet 1  . fluconazole (DIFLUCAN) 150 MG tablet Take 150 mg by mouth. x2    . fluocinonide cream (LIDEX) 4.27 % Apply 1 application topically 2 (two) times daily.    . fluticasone (CUTIVATE) 0.05 % cream Apply 1 application topically 2 (two) times daily.    [name] ibuprofen (ADVIL,MOTRIN) 200 MG tablet Take 3 tablets (600 mg total) by mouth every 6 (six) hours as needed. For pain 30 tablet 0  . ketoconazole (NIZORAL) 2 % cream Apply 1 application topically daily.    . Multiple Vitamin (MULTIVITAMIN) tablet Take 1 tablet by mouth daily.    . naratriptan (AMERGE) 2.5 MG tablet Take 1 tablet (2.5 mg total) by mouth as needed. Take one (1) tablet at onset of headache; if returns or does not resolve, may repeat after 4 hours; do not exceed five (5) mg in 24 hours. Use w/3 20mg  prednisone 10 tablet 3  . Omega-3 Fatty Acids (FISH OIL) 1200 MG CAPS Take 1 capsule by mouth daily.    . predniSONE (DELTASONE) 20 MG tablet Take 3 tablets (60 mg total) by  mouth daily as needed. For migraines; takes with Amerge at onset of migraine 12 tablet 0  . Red Yeast Rice Extract 600 MG CAPS Take 1,200 mg by mouth daily.    . valACYclovir (VALTREX) 500 MG tablet Take 500 mg by mouth daily as needed. For cold sores    . valsartan (DIOVAN) 320 MG tablet Take 1 tablet (320 mg total) by mouth daily with breakfast. For BP 90 tablet 0   No current facility-administered medications on file prior to visit.   Allergies  Allergen Reactions  . Penicillins     REACTION: hives     BP 126/88 mmHg  Pulse 80  Temp(Src) 97.8 F (36.6 C) (Temporal)  Resp 16  Ht 5' 6.5"  (1.689 m)  Wt 178 lb (80.74 kg)  BMI 28.30 kg/m2  SpO2 98%  LMP 09/05/2012- Hysterectomy Wt Readings from Last 3 Encounters:  08/16/14 178 lb (80.74 kg)  08/04/14 178 lb (80.74 kg)  03/28/14 173 lb (78.472 kg)   Objective:   Physical Exam  Constitutional: She is oriented to person, place, and time. She appears well-developed and well-nourished. She has a sickly appearance. No distress.  HENT:  Head: Normocephalic.  Right Ear: External ear and ear canal normal. Tympanic membrane is bulging.  Left Ear: External ear and ear canal normal. Tympanic membrane is bulging.  Nose: Mucosal edema and rhinorrhea present. Right sinus exhibits maxillary sinus tenderness. Right sinus exhibits no frontal sinus tenderness. Left sinus exhibits maxillary sinus tenderness. Left sinus exhibits no frontal sinus tenderness.  Mouth/Throat: Uvula is midline and mucous membranes are normal. Mucous membranes are not pale and not dry. No trismus in the jaw. No uvula swelling. Posterior oropharyngeal erythema present. No oropharyngeal exudate, posterior oropharyngeal edema or tonsillar abscesses.  TMs bulging with clear fluid bilaterally.  TMs non-erythematous and non-edematous bilaterally. Turbinates erythematous bilaterally.  Eyes: Conjunctivae and lids are normal. Pupils are equal, round, and reactive to light. Right eye exhibits no discharge. Left eye exhibits no discharge. No scleral icterus.  Neck: Trachea normal and normal range of motion. Neck supple. No tracheal tenderness present. No tracheal deviation present.  Raspy voice.  Cardiovascular: Normal rate, regular rhythm, S1 normal, S2 normal, normal heart sounds and normal pulses.  Exam reveals no gallop, no distant heart sounds and no friction rub.   No murmur heard. Pulmonary/Chest: Effort normal and breath sounds normal. No stridor. No respiratory distress. She has no decreased breath sounds. She has no wheezes. She has no rhonchi. She has no rales. She  exhibits no tenderness.  Abdominal: Soft. Bowel sounds are normal. There is no tenderness. There is no rebound and no guarding.  Lymphadenopathy:  No tenderness or LAD.  Neurological: She is alert and oriented to person, place, and time. Gait normal.  Skin: Skin is warm, dry and intact. No rash noted. She is not diaphoretic.  Psychiatric: She has a normal mood and affect. Her speech is normal and behavior is normal. Judgment and thought content normal. Cognition and memory are normal.  Vitals reviewed.  Assessment & Plan:  1. Acute pharyngitis, unspecified pharyngitis type & Acute Maxillary Sinusitis- Most likely Viral and Allergies. - Start Z-Pak on 08/19/14 if you are not feeling better by then- azithromycin (ZITHROMAX) 250 MG tablet; Take 2 tablets PO on day 1, then 1 tablet PO Q24H x 4 days  Dispense: 6 tablet; Refill: 0 - Take Tessalon Perles as prescribed for cough- benzonatate (TESSALON PERLES) 100 MG capsule; Take 1 capsule (100 mg total)  by mouth every 6 (six) hours as needed for cough.  Dispense: 60 capsule; Refill: 1 - Take Flonase as prescribed for nasal inflammation- fluticasone (FLONASE) 50 MCG/ACT nasal spray; Place 2 sprays into both nostrils at bedtime.  Dispense: 16 g; Refill: 1 -Take Prednisone as prescribed for inflammation-  predniSONE (DELTASONE) 20 MG tablet; Take 3 tablets PO QDaily for 3 days, then take 2 tablets PO QDaily for 3 days, then take 1 tablet PO QDaily for 3 days  Dispense: 18 tablet; Refill: 0 -Continue Claritin OTC daily.  2. Sore throat - POC Influenza A&B- Negative  Discussed medication effects and SE's.  Pt agreed to treatment plan. If you are not feeling better in 10-14 days, then please call the office.   Please follow up in May 2016.    Madailein Londo, Stephani Police, PA-C 10:40 AM [facility]

## 2014-08-16 NOTE — Patient Instructions (Signed)
- Take Claritin OTC for allergies and to help with fluid behind ear drums.   -While drinking fluids, pinch and hold nose close and swallow.  This will help open up your eustachian tubes to drain the fluid behind your ear drums. -Try steam showers to open your nasal passages.  Drink lots of water to stay hydrated and to thin mucous. - Take Tessalon Perles as prescribed for cough -I sent in prescription for Flonase to help the inflammation.  Take 2 sprays in each nostril at bedtime.  Make sure you spray towards the outside of each nostril towards the outer corner of your eye, hold nose close and tilt head back.  This will help the medication get into your sinuses.  If you do not like this medication, then use saline nasal sprays same directions as above for Flonase. - Salt water gargles. You can also do 1 TSP liquid Maalox and 1 TSP liquid benadryl- mix/ gargle/ spit -Take Prednisone as prescribed for inflammation.  -It can take up to 2 weeks to feel better.  Sinusitis is mostly caused by viruses.  Can start taking Z-Pak on Saturday 08/19/14 if you are not feeling better.  Sinusitis Sinusitis is redness, soreness, and inflammation of the paranasal sinuses. Paranasal sinuses are air pockets within the bones of your face (beneath the eyes, the middle of the forehead, or above the eyes). In healthy paranasal sinuses, mucus is able to drain out, and air is able to circulate through them by way of your nose. However, when your paranasal sinuses are inflamed, mucus and air can become trapped. This can allow bacteria and other germs to grow and cause infection. Sinusitis can develop quickly and last only a short time (acute) or continue over a long period (chronic). Sinusitis that lasts for more than 12 weeks is considered chronic.  CAUSES  Causes of sinusitis include:  Allergies.  Structural abnormalities, such as displacement of the cartilage that separates your nostrils (deviated septum), which can  decrease the air flow through your nose and sinuses and affect sinus drainage.  Functional abnormalities, such as when the small hairs (cilia) that line your sinuses and help remove mucus do not work properly or are not present. SIGNS AND SYMPTOMS  Symptoms of acute and chronic sinusitis are the same. The primary symptoms are pain and pressure around the affected sinuses. Other symptoms include:  Upper toothache.  Earache.  Headache.  Bad breath.  Decreased sense of smell and taste.  A cough, which worsens when you are lying flat.  Fatigue.  Fever.  Thick drainage from your nose, which often is green and may contain pus (purulent).  Swelling and warmth over the affected sinuses. DIAGNOSIS  Your health care provider will perform a physical exam. During the exam, your health care provider may:  Look in your nose for signs of abnormal growths in your nostrils (nasal polyps).  Tap over the affected sinus to check for signs of infection.  View the inside of your sinuses (endoscopy) using an imaging device that has a light attached (endoscope). If your health care provider suspects that you have chronic sinusitis, one or more of the following tests may be recommended:  Allergy tests.  Nasal culture. A sample of mucus is taken from your nose, sent to a lab, and screened for bacteria.  Nasal cytology. A sample of mucus is taken from your nose and examined by your health care provider to determine if your sinusitis is related to an allergy. TREATMENT  Most cases of acute sinusitis are related to a viral infection and will resolve on their own within 10 days. Sometimes medicines are prescribed to help relieve symptoms (pain medicine, decongestants, nasal steroid sprays, or saline sprays).  However, for sinusitis related to a bacterial infection, your health care provider will prescribe antibiotic medicines. These are medicines that will help kill the bacteria causing the infection.    Rarely, sinusitis is caused by a fungal infection. In theses cases, your health care provider will prescribe antifungal medicine. For some cases of chronic sinusitis, surgery is needed. Generally, these are cases in which sinusitis recurs more than 3 times per year, despite other treatments. HOME CARE INSTRUCTIONS   Drink plenty of water. Water helps thin the mucus so your sinuses can drain more easily.  Use a humidifier.  Inhale steam 3 to 4 times a day (for example, sit in the bathroom with the shower running).  Apply a warm, moist washcloth to your face 3 to 4 times a day, or as directed by your health care provider.  Use saline nasal sprays to help moisten and clean your sinuses.  Take medicines only as directed by your health care provider.  If you were prescribed either an antibiotic or antifungal medicine, finish it all even if you start to feel better. SEEK IMMEDIATE MEDICAL CARE IF:  You have increasing pain or severe headaches.  You have nausea, vomiting, or drowsiness.  You have swelling around your face.  You have vision problems.  You have a stiff neck.  You have difficulty breathing. MAKE SURE YOU:   Understand these instructions.  Will watch your condition.  Will get help right away if you are not doing well or get worse. Document Released: 09/08/2005 Document Revised: 01/23/2014 Document Reviewed: 09/23/2011 [facility] Patient Information 2015 [place], [place]. This information is not intended to replace advice given to you by your health care provider. Make sure you discuss any questions you have with your health care provider.

## 2014-09-06 ENCOUNTER — Ambulatory Visit: Payer: BC Managed Care – PPO | Admitting: Podiatry

## 2014-09-13 ENCOUNTER — Ambulatory Visit: Payer: BC Managed Care – PPO | Admitting: Podiatry

## 2014-10-27 ENCOUNTER — Emergency Department (HOSPITAL_COMMUNITY)
Admission: EM | Admit: 2014-10-27 | Discharge: 2014-10-27 | Disposition: A | Discharge status: Home / Self Care | Payer: BLUE CROSS/BLUE SHIELD | Source: Home / Self Care | Attending: Family Medicine | Admitting: Family Medicine

## 2014-10-27 ENCOUNTER — Encounter (HOSPITAL_COMMUNITY): Payer: Self-pay | Admitting: Emergency Medicine

## 2014-10-27 ENCOUNTER — Telehealth: Payer: Self-pay

## 2014-10-27 DIAGNOSIS — I10 Essential (primary) hypertension: Secondary | ICD-10-CM

## 2014-10-27 LAB — POCT I-STAT, CHEM 8
BUN: 14 mg/dL (ref 6–23)
Calcium, Ion: 1.28 mmol/L — ABNORMAL HIGH (ref 1.12–1.23)
Chloride: 102 mmol/L (ref 96–112)
Creatinine, Ser: 0.7 mg/dL (ref 0.50–1.10)
Glucose, Bld: 104 mg/dL — ABNORMAL HIGH (ref 70–99)
HCT: 43 % (ref 36.0–46.0)
Hemoglobin: 14.6 g/dL (ref 12.0–15.0)
Potassium: 4 mmol/L (ref 3.5–5.1)
Sodium: 140 mmol/L (ref 135–145)
TCO2: 25 mmol/L (ref 0–100)

## 2014-10-27 MED ORDER — HYDROCHLOROTHIAZIDE 12.5 MG PO TABS: 12.5000 mg | ORAL_TABLET | Freq: Every day | ORAL | Status: DC

## 2014-10-27 MED ORDER — TRAZODONE HCL 50 MG PO TABS
50.0000 mg | ORAL_TABLET | Freq: Every day | ORAL | Status: DC
Start: 2014-10-27 — End: 2016-03-03

## 2014-10-27 NOTE — Telephone Encounter (Signed)
Received paper note from front office staff , return call to patient as she is complaining of headache for 2 days, no relief with migraine medication, generally not feeling well, elevated blood pressure at 171/105 with additional BP meds taken, advised patient to go to ER today per [name] , PA. Patient verbalized understanding and of importance and advised she would go.

## 2014-10-27 NOTE — ED Notes (Signed)
Pt has been experiencing a headache and elevated blood pressure for the last two days. Pt's husband is on a trip and she is having to put her dog down tomorrow.  Pt also has a high stress job.  Pt's BP medication was changed about 2 months ago due to insurance.

## 2014-10-27 NOTE — Discharge Instructions (Signed)
Use medicine as prescribed and see your doctor next week for recheck

## 2014-10-27 NOTE — ED Provider Notes (Signed)
CSN: [id number]     Arrival date & time 10/27/14  1014 History   None    Chief Complaint  Patient presents with  . Hypertension  . Headache   (Consider location/radiation/quality/duration/timing/severity/associated sxs/prior Treatment) Patient is a 54 y.o. female presenting with hypertension and headaches. The history is provided by the patient.  Hypertension This is a chronic problem. The current episode started more than 2 days ago (under considerable stress with husband out of town, dog is sick.). The problem has been gradually worsening. Associated symptoms include headaches. Pertinent negatives include no chest pain, no abdominal pain and no shortness of breath.  Headache Associated symptoms: no abdominal pain     Past Medical History  Diagnosis Date  . HTN (hypertension)   . PONV (postoperative nausea and vomiting)   . 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 Hyperlipidemia   . Anxiety   . Migraine   . HSV-1 infection    Past Surgical History  Procedure Laterality Date  . Tubal ligation    . Acl replacement    . Cervical disc surgery  2012  . Robotic assisted total hysterectomy  09/10/2012    Procedure: ROBOTIC ASSISTED TOTAL HYSTERECTOMY;  Surgeon: Elveria Royals, MD;  Location: [facility];  Service: Gynecology;  Laterality: N/A;  3 hrs.  . Bilateral salpingectomy  09/10/2012    Procedure: BILATERAL SALPINGECTOMY;  Surgeon: Elveria Royals, MD;  Location: [facility];  Service: Gynecology;  Laterality: Bilateral;  . Elbow surgery Right 01/2014   Family History  Problem Relation Age of Onset  . Alzheimer's disease Father   . Hypertension Mother   . Stroke Mother   . Cancer Mother   . Hyperlipidemia Brother   . Heart disease Brother   . Diabetes Brother   . Hypertension Brother    History  Substance Use Topics  . Smoking status: Never Smoker   . Smokeless tobacco: Never Used     Comment: tobacco use - no   . Alcohol Use: Yes     Comment: rare    OB History    No data available     Review of Systems  Constitutional: Negative.   Eyes: Negative for visual disturbance.  Respiratory: Negative.  Negative for shortness of breath.   Cardiovascular: Negative.  Negative for chest pain.  Gastrointestinal: Negative.  Negative for abdominal pain.  Neurological: Positive for headaches.    Allergies  Penicillins  Home Medications   Prior to Admission medications   Medication Sig Start Date End Date Taking? Authorizing Provider  ALPRAZolam Duanne Moron) 0.5 MG tablet Take 1 tablet (0.5 mg total) by mouth 2 (two) times daily as needed for anxiety. 08/04/14  Yes [name], PA-C  naratriptan (AMERGE) 2.5 MG tablet Take 1 tablet (2.5 mg total) by mouth as needed. Take one (1) tablet at onset of headache; if returns or does not resolve, may repeat after 4 hours; do not exceed five (5) mg in 24 hours. Use w/3 20mg  prednisone 06/21/14  Yes Unk Pinto, MD  valsartan (DIOVAN) 320 MG tablet Take 1 tablet (320 mg total) by mouth daily with breakfast. For BP 06/28/14  Yes Unk Pinto, MD  Ascorbic Acid (VITAMIN C) 1000 MG tablet Take 1,000 mg by mouth as needed. In winter for colds    Historical Provider, MD  aspirin 81 MG EC tablet Take 81 mg by mouth daily.      Historical Provider, MD  cholecalciferol (VITAMIN D) 1000 UNITS tablet Take 1,000 Units by  mouth daily.    Historical Provider, MD  Co-Enzyme Q-10 100 MG CAPS Take 1 capsule by mouth daily.    Historical Provider, MD  cyclobenzaprine (FLEXERIL) 10 MG tablet Take 1 tablet (10 mg total) by mouth at bedtime. 08/04/14 08/04/15  [name], PA-C  fluconazole (DIFLUCAN) 150 MG tablet Take 150 mg by mouth. x2    Historical Provider, MD  fluocinonide cream (LIDEX) 2.20 % Apply 1 application topically 2 (two) times daily.    Historical Provider, MD  fluticasone (CUTIVATE) 0.05 % cream Apply 1 application topically 2 (two) times daily.    Historical Provider, MD  fluticasone (FLONASE) 50  MCG/ACT nasal spray Place 2 sprays into both nostrils at bedtime. 08/16/14   Jennifer L Couillard, PA-C  hydrochlorothiazide (HYDRODIURIL) 12.5 MG tablet Take 1 tablet (12.5 mg total) by mouth daily. 10/27/14   Billy Fischer, MD  ibuprofen (ADVIL,MOTRIN) 200 MG tablet Take 3 tablets (600 mg total) by mouth every 6 (six) hours as needed. For pain 09/11/12   Elveria Royals, MD  ketoconazole (NIZORAL) 2 % cream Apply 1 application topically daily.    Historical Provider, MD  Multiple Vitamin (MULTIVITAMIN) tablet Take 1 tablet by mouth daily.    Historical Provider, MD  Omega-3 Fatty Acids (FISH OIL) 1200 MG CAPS Take 1 capsule by mouth daily.    Historical Provider, MD  Red Yeast Rice Extract 600 MG CAPS Take 1,200 mg by mouth daily.    Historical Provider, MD  traZODone (DESYREL) 50 MG tablet Take 1 tablet (50 mg total) by mouth at bedtime. 10/27/14   Billy Fischer, MD  valACYclovir (VALTREX) 500 MG tablet Take 500 mg by mouth daily as needed. For cold sores    Historical Provider, MD   BP 168/112 mmHg  Pulse 76  Temp(Src) 98 F (36.7 C) (Oral)  Resp 16  SpO2 98%  LMP 09/05/2012 Physical Exam  Constitutional: She is oriented to person, place, and time. She appears well-developed and well-nourished. No distress.  HENT:  Head: Normocephalic.  Right Ear: External ear normal.  Left Ear: External ear normal.  Mouth/Throat: Oropharynx is clear and moist.  Eyes: Conjunctivae and EOM are normal. Pupils are equal, round, and reactive to light.  Neck: Normal range of motion. Neck supple.  Cardiovascular: Normal rate, regular rhythm, normal heart sounds and intact distal pulses.   Pulmonary/Chest: Effort normal and breath sounds normal.  Lymphadenopathy:    She has no cervical adenopathy.  Neurological: She is alert and oriented to person, place, and time. No cranial nerve deficit. Coordination normal.  Skin: Skin is warm and dry.  Nursing note and vitals reviewed.   ED Course  Procedures  (including critical care time) Labs Review Labs Reviewed  POCT I-STAT, CHEM 8 - Abnormal; Notable for the following:    Glucose, Bld 104 (*)    Calcium, Ion 1.28 (*)    All other components within normal limits   i-stat wnl. Imaging Review No results found.   MDM   1. Essential hypertension        Billy Fischer, MD 10/27/14 1151

## 2014-11-01 ENCOUNTER — Ambulatory Visit (INDEPENDENT_AMBULATORY_CARE_PROVIDER_SITE_OTHER): Payer: BLUE CROSS/BLUE SHIELD | Admitting: Physician Assistant

## 2014-11-01 ENCOUNTER — Encounter: Payer: Self-pay | Admitting: Physician Assistant

## 2014-11-01 VITALS — BP 132/90 | HR 88 | Temp 97.7°F | Resp 16 | Ht 66.5 in | Wt 182.0 lb

## 2014-11-01 DIAGNOSIS — I10 Essential (primary) hypertension: Secondary | ICD-10-CM

## 2014-11-01 DIAGNOSIS — Z79899 Other long term (current) drug therapy: Secondary | ICD-10-CM

## 2014-11-01 DIAGNOSIS — R519 Headache, unspecified: Secondary | ICD-10-CM

## 2014-11-01 DIAGNOSIS — R51 Headache: Secondary | ICD-10-CM

## 2014-11-01 LAB — COMPREHENSIVE METABOLIC PANEL
ALT: 22 U/L (ref 0–35)
AST: 21 U/L (ref 0–37)
Albumin: 4.5 g/dL (ref 3.5–5.2)
Alkaline Phosphatase: 75 U/L (ref 39–117)
BUN: 17 mg/dL (ref 6–23)
CO2: 26 mEq/L (ref 19–32)
Calcium: 9.4 mg/dL (ref 8.4–10.5)
Chloride: 101 mEq/L (ref 96–112)
Creat: 0.77 mg/dL (ref 0.50–1.10)
Glucose, Bld: 105 mg/dL — ABNORMAL HIGH (ref 70–99)
Potassium: 4 mEq/L (ref 3.5–5.3)
Sodium: 139 mEq/L (ref 135–145)
Total Bilirubin: 0.4 mg/dL (ref 0.2–1.2)
Total Protein: 6.9 g/dL (ref 6.0–8.3)

## 2014-11-01 LAB — CBC WITH DIFFERENTIAL/PLATELET
Basophils Absolute: 0 10*3/uL (ref 0.0–0.1)
Basophils Relative: 0 % (ref 0–1)
Eosinophils Absolute: 0.1 10*3/uL (ref 0.0–0.7)
Eosinophils Relative: 1 % (ref 0–5)
HCT: 40.5 % (ref 36.0–46.0)
Hemoglobin: 14 g/dL (ref 12.0–15.0)
Lymphocytes Relative: 22 % (ref 12–46)
Lymphs Abs: 2.1 10*3/uL (ref 0.7–4.0)
MCH: 30.8 pg (ref 26.0–34.0)
MCHC: 34.6 g/dL (ref 30.0–36.0)
MCV: 89 fL (ref 78.0–100.0)
MPV: 10.9 fL (ref 8.6–12.4)
Monocytes Absolute: 0.5 10*3/uL (ref 0.1–1.0)
Monocytes Relative: 5 % (ref 3–12)
Neutro Abs: 6.8 10*3/uL (ref 1.7–7.7)
Neutrophils Relative %: 72 % (ref 43–77)
Platelets: 307 10*3/uL (ref 150–400)
RBC: 4.55 MIL/uL (ref 3.87–5.11)
RDW: 13.1 % (ref 11.5–15.5)
WBC: 9.4 10*3/uL (ref 4.0–10.5)

## 2014-11-01 LAB — MAGNESIUM: Magnesium: 2 mg/dL (ref 1.5–2.5)

## 2014-11-01 MED ORDER — ATENOLOL 50 MG PO TABS: ORAL_TABLET | ORAL | Status: DC

## 2014-11-01 NOTE — Progress Notes (Signed)
Assessment and Plan: HTN-? Medication related with sudafed/NSAID/stress- versus need to increase meds- will stop HCTZ, check BMP, CBC, Mag, and we will add on atenolol low dose for 1-2 weeks at night and then stop it to see if BP is normal. If not we will switch her back to benicar.   HPI 55 y.o.female with history of HTN, preDM presents for follow up from urgent care visit on 10/27/2014 for HTN and headaches. She states that she had elevated BP for several days, and had a headache that was different from her normal migraines. HA was pressure all over and felt weak, no dizziness, changes in vision/speech, focal weakness, CP, SOB.  She was only on 1/2 pill but increased to Diovan 320 but still having elevated BP so went to UC and was put on HCTZ 12.5 there. She states she was on sudafed the week before, and on ibuprofen for HA. Husband out of town, dog put down Saturday, work is stressful, daughter is going through a lot. Today she is feeling better but states she is feeling washed out from HCTZ.  BP Readings from Last 3 Encounters:  11/01/14 132/90  10/27/14 168/112  08/16/14 126/88   Lab Results  Component Value Date   CREATININE 0.70 10/27/2014   BUN 14 10/27/2014   NA 140 10/27/2014   K 4.0 10/27/2014   CL 102 10/27/2014   CO2 26 08/04/2014   Past Medical History  Diagnosis Date  . HTN (hypertension)   . PONV (postoperative nausea and vomiting)   . 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 Hyperlipidemia   . Anxiety   . Migraine   . HSV-1 infection      Allergies  Allergen Reactions  . Penicillins     REACTION: hives      Current Outpatient Prescriptions on File Prior to Visit  Medication Sig Dispense Refill  . ALPRAZolam (XANAX) 0.5 MG tablet Take 1 tablet (0.5 mg total) by mouth 2 (two) times daily as needed for anxiety. 60 tablet 0  . Ascorbic Acid (VITAMIN C) 1000 MG tablet Take 1,000 mg by mouth as needed. In winter for colds    . aspirin 81 MG EC  tablet Take 81 mg by mouth daily.      . cholecalciferol (VITAMIN D) 1000 UNITS tablet Take 1,000 Units by mouth daily.    [name] Co-Enzyme Q-10 100 MG CAPS Take 1 capsule by mouth daily.    . cyclobenzaprine (FLEXERIL) 10 MG tablet Take 1 tablet (10 mg total) by mouth at bedtime. 30 tablet 1  . fluconazole (DIFLUCAN) 150 MG tablet Take 150 mg by mouth. x2    . fluocinonide cream (LIDEX) 8.46 % Apply 1 application topically 2 (two) times daily.    . fluticasone (CUTIVATE) 0.05 % cream Apply 1 application topically 2 (two) times daily.    . fluticasone (FLONASE) 50 MCG/ACT nasal spray Place 2 sprays into both nostrils at bedtime. 16 g 1  . hydrochlorothiazide (HYDRODIURIL) 12.5 MG tablet Take 1 tablet (12.5 mg total) by mouth daily. 30 tablet 0  . ibuprofen (ADVIL,MOTRIN) 200 MG tablet Take 3 tablets (600 mg total) by mouth every 6 (six) hours as needed. For pain 30 tablet 0  . ketoconazole (NIZORAL) 2 % cream Apply 1 application topically daily.    . Multiple Vitamin (MULTIVITAMIN) tablet Take 1 tablet by mouth daily.    . naratriptan (AMERGE) 2.5 MG tablet Take 1 tablet (2.5 mg total) by mouth as needed. Take one (1)  tablet at onset of headache; if returns or does not resolve, may repeat after 4 hours; do not exceed five (5) mg in 24 hours. Use w/3 20mg  prednisone 10 tablet 3  . Omega-3 Fatty Acids (FISH OIL) 1200 MG CAPS Take 1 capsule by mouth daily.    . Red Yeast Rice Extract 600 MG CAPS Take 1,200 mg by mouth daily.    . traZODone (DESYREL) 50 MG tablet Take 1 tablet (50 mg total) by mouth at bedtime. 10 tablet 1  . valACYclovir (VALTREX) 500 MG tablet Take 500 mg by mouth daily as needed. For cold sores    . valsartan (DIOVAN) 320 MG tablet Take 1 tablet (320 mg total) by mouth daily with breakfast. For BP 90 tablet 0   No current facility-administered medications on file prior to visit.    ROS: all negative except above.   Physical Exam: Filed Weights   11/01/14 1125  Weight: 182 lb  (82.555 kg)   BP 132/90 mmHg  Pulse 88  Temp(Src) 97.7 F (36.5 C)  Resp 16  Wt 182 lb (82.555 kg)  LMP 09/05/2012 General Appearance: Well nourished, in no apparent distress. Eyes: PERRLA, EOMs, conjunctiva no swelling or erythema Sinuses: No Frontal/maxillary tenderness ENT/Mouth: Ext aud canals clear, TMs without erythema, bulging. No erythema, swelling, or exudate on post pharynx.  Tonsils not swollen or erythematous. Hearing normal.  Neck: Supple, thyroid normal.  Respiratory: Respiratory effort normal, BS equal bilaterally without rales, rhonchi, wheezing or stridor.  Cardio: RRR with no MRGs. Brisk peripheral pulses without edema.  Abdomen: Soft, + BS.  Non tender, no guarding, rebound, hernias, masses. Lymphatics: Non tender without lymphadenopathy.  Musculoskeletal: Full ROM, 5/5 strength, normal gait.  Skin: Warm, dry without rashes, lesions, ecchymosis.  Neuro: Cranial nerves intact. Normal muscle tone, no cerebellar symptoms. Sensation intact.  Psych: Awake and oriented X 3, normal affect, Insight and Judgment appropriate.     [name], PA-C 11:31 AM [facility]

## 2014-11-01 NOTE — Patient Instructions (Signed)
Stay on diovan 320, will add on low dose of a beta blocker for you to take at night fo 1-2 weeks, then stop it. If you BP still goes up we will switch you back to the Benicar  Goal BP:  For patients younger than 60: Goal BP < 140/90. For patients 60 and older: Goal BP < 150/90. For patients with diabetes: Goal BP < 140/90. Your most recent BP: (!) 187/112 mmHg.  Take your medications faithfully as instructed. Maintain a healthy weight. Get at least 150 minutes of aerobic exercise per week. Minimize salt intake. Minimize alcohol intake   Hypertension Hypertension, commonly called high blood pressure, is when the force of blood pumping through your arteries is too strong. Your arteries are the blood vessels that carry blood from your heart throughout your body. A blood pressure reading consists of a higher number over a lower number, such as 110/72. The higher number (systolic) is the pressure inside your arteries when your heart pumps. The lower number (diastolic) is the pressure inside your arteries when your heart relaxes. Ideally you want your blood pressure below 120/80. Hypertension forces your heart to work harder to pump blood. Your arteries may become narrow or stiff. Having hypertension puts you at risk for heart disease, stroke, and other problems.  RISK FACTORS Some risk factors for high blood pressure are controllable. Others are not.  Risk factors you cannot control include:   Race. You may be at higher risk if you are African American.  Age. Risk increases with age.  Gender. Men are at higher risk than women before age 55 years. After age 73, women are at higher risk than men. Risk factors you can control include:  Not getting enough exercise or physical activity.  Being overweight.  Getting too much fat, sugar, calories, or salt in your diet.  Drinking too much alcohol. SIGNS AND SYMPTOMS Hypertension does not usually cause signs or symptoms. Extremely high blood  pressure (hypertensive crisis) may cause headache, anxiety, shortness of breath, and nosebleed. DIAGNOSIS  To check if you have hypertension, your health care provider will measure your blood pressure while you are seated, with your arm held at the level of your heart. It should be measured at least twice using the same arm. Certain conditions can cause a difference in blood pressure between your right and left arms. A blood pressure reading that is higher than normal on one occasion does not mean that you need treatment. If one blood pressure reading is high, ask your health care provider about having it checked again. TREATMENT  Treating high blood pressure includes making lifestyle changes and possibly taking medicine. Living a healthy lifestyle can help lower high blood pressure. You may need to change some of your habits. Lifestyle changes may include:  Following the DASH diet. This diet is high in fruits, vegetables, and whole grains. It is low in salt, red meat, and added sugars.  Getting at least 2 hours of brisk physical activity every week.  Losing weight if necessary.  Not smoking.  Limiting alcoholic beverages.  Learning ways to reduce stress. If lifestyle changes are not enough to get your blood pressure under control, your health care provider may prescribe medicine. You may need to take more than one. Work closely with your health care provider to understand the risks and benefits. HOME CARE INSTRUCTIONS  Have your blood pressure rechecked as directed by your health care provider.   Take medicines only as directed by your health  care provider. Follow the directions carefully. Blood pressure medicines must be taken as prescribed. The medicine does not work as well when you skip doses. Skipping doses also puts you at risk for problems.   Do not smoke.   Monitor your blood pressure at home as directed by your health care provider. SEEK MEDICAL CARE IF:   You think you  are having a reaction to medicines taken.  You have recurrent headaches or feel dizzy.  You have swelling in your ankles.  You have trouble with your vision. SEEK IMMEDIATE MEDICAL CARE IF:  You develop a severe headache or confusion.  You have unusual weakness, numbness, or feel faint.  You have severe chest or abdominal pain.  You vomit repeatedly.  You have trouble breathing. MAKE SURE YOU:   Understand these instructions.  Will watch your condition.  Will get help right away if you are not doing well or get worse. Document Released: 09/08/2005 Document Revised: 01/23/2014 Document Reviewed: 07/01/2013 [facility] Patient Information 2015 [place], [place]. This information is not intended to replace advice given to you by your health care provider. Make sure you discuss any questions you have with your health care provider.

## 2014-11-02 LAB — TSH: TSH: 1.711 u[IU]/mL (ref 0.350–4.500)

## 2015-03-29 ENCOUNTER — Encounter: Payer: Self-pay | Admitting: Emergency Medicine

## 2015-03-30 ENCOUNTER — Other Ambulatory Visit: Payer: Self-pay | Admitting: Physician Assistant

## 2015-03-30 MED ORDER — ALPRAZOLAM 0.5 MG PO TABS
0.5000 mg | ORAL_TABLET | Freq: Two times a day (BID) | ORAL | Status: DC | PRN
Start: 2015-03-30 — End: 2018-08-16

## 2015-05-23 ENCOUNTER — Ambulatory Visit (INDEPENDENT_AMBULATORY_CARE_PROVIDER_SITE_OTHER): Payer: BLUE CROSS/BLUE SHIELD | Admitting: Physician Assistant

## 2015-05-23 ENCOUNTER — Encounter: Payer: Self-pay | Admitting: Physician Assistant

## 2015-05-23 VITALS — BP 158/98 | HR 84 | Temp 97.7°F | Resp 16 | Ht 66.5 in | Wt 178.4 lb

## 2015-05-23 DIAGNOSIS — Z79899 Other long term (current) drug therapy: Secondary | ICD-10-CM

## 2015-05-23 DIAGNOSIS — D649 Anemia, unspecified: Secondary | ICD-10-CM

## 2015-05-23 DIAGNOSIS — E559 Vitamin D deficiency, unspecified: Secondary | ICD-10-CM | POA: Diagnosis not present

## 2015-05-23 DIAGNOSIS — R7309 Other abnormal glucose: Secondary | ICD-10-CM | POA: Insufficient documentation

## 2015-05-23 DIAGNOSIS — M25512 Pain in left shoulder: Secondary | ICD-10-CM

## 2015-05-23 DIAGNOSIS — Z0000 Encounter for general adult medical examination without abnormal findings: Secondary | ICD-10-CM

## 2015-05-23 DIAGNOSIS — N8501 Benign endometrial hyperplasia: Secondary | ICD-10-CM

## 2015-05-23 DIAGNOSIS — C44311 Basal cell carcinoma of skin of nose: Secondary | ICD-10-CM

## 2015-05-23 DIAGNOSIS — R7303 Prediabetes: Secondary | ICD-10-CM

## 2015-05-23 DIAGNOSIS — I10 Essential (primary) hypertension: Secondary | ICD-10-CM | POA: Diagnosis not present

## 2015-05-23 DIAGNOSIS — M5030 Other cervical disc degeneration, unspecified cervical region: Secondary | ICD-10-CM

## 2015-05-23 DIAGNOSIS — E785 Hyperlipidemia, unspecified: Secondary | ICD-10-CM

## 2015-05-23 DIAGNOSIS — R9431 Abnormal electrocardiogram [ECG] [EKG]: Secondary | ICD-10-CM

## 2015-05-23 DIAGNOSIS — Z0001 Encounter for general adult medical examination with abnormal findings: Secondary | ICD-10-CM

## 2015-05-23 DIAGNOSIS — G43809 Other migraine, not intractable, without status migrainosus: Secondary | ICD-10-CM

## 2015-05-23 HISTORY — DX: Basal cell carcinoma of skin of nose: C44.311

## 2015-05-23 LAB — LIPID PANEL
Cholesterol: 200 mg/dL (ref 125–200)
HDL: 35 mg/dL — ABNORMAL LOW (ref >=46)
LDL Cholesterol: 130 mg/dL — ABNORMAL HIGH (ref <130)
Total CHOL/HDL Ratio: 5.7 Ratio — ABNORMAL HIGH (ref <=5.0)
Triglycerides: 175 mg/dL — ABNORMAL HIGH (ref <150)
VLDL: 35 mg/dL — ABNORMAL HIGH (ref <30)

## 2015-05-23 LAB — HEMOGLOBIN A1C
Hgb A1c MFr Bld: 5.6 % (ref <5.7)
Mean Plasma Glucose: 114 mg/dL (ref <117)

## 2015-05-23 LAB — HEPATIC FUNCTION PANEL
ALT: 16 U/L (ref 6–29)
AST: 18 U/L (ref 10–35)
Albumin: 4.6 g/dL (ref 3.6–5.1)
Alkaline Phosphatase: 76 U/L (ref 33–130)
Bilirubin, Direct: 0.1 mg/dL (ref <=0.2)
Indirect Bilirubin: 0.4 mg/dL (ref 0.2–1.2)
Total Bilirubin: 0.5 mg/dL (ref 0.2–1.2)
Total Protein: 7 g/dL (ref 6.1–8.1)

## 2015-05-23 LAB — BASIC METABOLIC PANEL WITH GFR
BUN: 13 mg/dL (ref 7–25)
CO2: 29 mmol/L (ref 20–31)
Calcium: 9.7 mg/dL (ref 8.6–10.4)
Chloride: 102 mmol/L (ref 98–110)
Creat: 0.67 mg/dL (ref 0.50–1.05)
GFR, Est African American: >89 mL/min (ref >=60)
GFR, Est Non African American: >89 mL/min (ref >=60)
Glucose, Bld: 96 mg/dL (ref 65–99)
Potassium: 4.3 mmol/L (ref 3.5–5.3)
Sodium: 138 mmol/L (ref 135–146)

## 2015-05-23 LAB — IRON AND TIBC
%SAT: 26 % (ref 11–50)
Iron: 84 ug/dL (ref 45–160)
TIBC: 322 ug/dL (ref 250–450)
UIBC: 238 ug/dL (ref 125–400)

## 2015-05-23 LAB — FERRITIN: Ferritin: 50 ng/mL (ref 10–291)

## 2015-05-23 LAB — TSH: TSH: 2.093 u[IU]/mL (ref 0.350–4.500)

## 2015-05-23 LAB — VITAMIN B12: Vitamin B-12: 618 pg/mL (ref 211–911)

## 2015-05-23 LAB — MAGNESIUM: Magnesium: 2.1 mg/dL (ref 1.5–2.5)

## 2015-05-23 MED ORDER — ATENOLOL 50 MG PO TABS: ORAL_TABLET | ORAL | Status: DC

## 2015-05-23 MED ORDER — MELOXICAM 15 MG PO TABS: ORAL_TABLET | ORAL | Status: DC

## 2015-05-23 NOTE — Progress Notes (Signed)
Complete Physical  Assessment and Plan: 1. Essential hypertension - continue medications, add on atenolol at night for BP/HA, DASH diet, exercise and monitor at home. Call if greater than 130/80.  - atenolol (TENORMIN) 50 MG tablet; 1/2-1 pill at night for blood pressure  Dispense: 30 tablet; Refill: 11 - CBC with Differential/Platelet - BASIC METABOLIC PANEL WITH GFR - Hepatic function panel - TSH - Urinalysis, Routine w reflex microscopic (not at [facility]) - Microalbumin / creatinine urine ratio - EKG 12-Lead  2. Hyperlipidemia -continue medications, check lipids, decrease fatty foods, increase activity.  - Lipid panel  3. ABNORMAL EKG Will monitor, normal echo 2011 - EKG 12-Lead  4. Endometrial hyperplasia without atypia, simple S/p TAH  5. Prediabetes Discussed general issues about diabetes pathophysiology and management., Educational material distributed., Suggested low cholesterol diet., Encouraged aerobic exercise., Discussed foot care., Reminded to get yearly retinal exam. - Hemoglobin A1c - Insulin, fasting  6. Medication management - Magnesium  7. Vitamin D deficiency - Vit D  25 hydroxy (rtn osteoporosis monitoring)  8. Encounter for general adult medical examination with abnormal findings - CBC with Differential/Platelet - BASIC METABOLIC PANEL WITH GFR - Hepatic function panel - TSH - Lipid panel - Hemoglobin A1c - Insulin, fasting - Magnesium - Vit D  25 hydroxy (rtn osteoporosis monitoring) - Urinalysis, Routine w reflex microscopic (not at [facility]) - Microalbumin / creatinine urine ratio - Ferritin - Iron and TIBC - Vitamin B12 - EKG 12-Lead  9. Left shoulder pain Likely bicipital tendonitis, mobic x 2 weeks, exercises given, PT referral, if not better follow up ortho - meloxicam (MOBIC) 15 MG tablet; Take one daily with food for 2 weeks, can take with tylenol, can not take with aleve, iburpofen, then as needed daily for pain  Dispense: 30 tablet;  Refill: 1 - Ambulatory referral to Physical Therapy  10. DDD (degenerative disc disease), cervical Chronic pain, suggest PT, mobic - Ambulatory referral to Physical Therapy  11. Basal cell carcinoma of nose Follow up DERM  12. Other migraine without status migrainosus, not intractable Possible from BP/cervival DDD, suggest adding atenolol and PT - atenolol (TENORMIN) 50 MG tablet; 1/2-1 pill at night for blood pressure  Dispense: 30 tablet; Refill: 11 - Ambulatory referral to Physical Therapy  13. Anemia, unspecified anemia type - Ferritin - Iron and TIBC - Vitamin B12  Discussed med's effects and SE's. Screening labs and tests as requested with regular follow-up as recommended. Over 40 minutes of exam, counseling, chart review, and complex, high level critical decision making was performed this visit.   HPI  54 y.o. female  presents for a complete physical.  Her blood pressure has not been controlled at home for 1 week with HA, no changes in vision, no changes in speech, she is on valsartan for BP and was suppose to be on atenolol but has not been taking, today their BP is BP: (!) 158/98 mmHg She does not workout. She denies chest pain, shortness of breath, dizziness.  She is not on cholesterol medication and denies myalgias. Her cholesterol is at goal. The cholesterol last visit was:   Lab Results  Component Value Date   CHOL 201* 08/04/2014   HDL 43 08/04/2014   LDLCALC 107* 08/04/2014   TRIG 256* 08/04/2014   CHOLHDL 4.7 08/04/2014   She has been working on diet and exercise for prediabetes, and denies paresthesia of the feet, polydipsia, polyuria and visual disturbances. Last A1C in the office was:  Lab Results  Component  Value Date   HGBA1C 5.8* 08/04/2014   Patient is on Vitamin D supplement.   She has history of migraines, works for [employer]. She has had left shoulder pain for several months with neck pain which is chronic. She does not recall a specific injury, take  ibuprofen, takes 2 every morning but has not tried anything else. Has seen Dr. Noemi Chapel in the past for her neck, will call appointment.   Current Medications:  Current Outpatient Prescriptions on File Prior to Visit  Medication Sig Dispense Refill  . ALPRAZolam (XANAX) 0.5 MG tablet Take 1 tablet (0.5 mg total) by mouth 2 (two) times daily as needed for anxiety. 60 tablet 0  . Ascorbic Acid (VITAMIN C) 1000 MG tablet Take 1,000 mg by mouth as needed. In winter for colds    . aspirin 81 MG EC tablet Take 81 mg by mouth daily.      [name] atenolol (TENORMIN) 50 MG tablet 1/2-1 pill at night for blood pressure 30 tablet 11  . cholecalciferol (VITAMIN D) 1000 UNITS tablet Take 1,000 Units by mouth daily.    [name] Co-Enzyme Q-10 100 MG CAPS Take 1 capsule by mouth daily.    . cyclobenzaprine (FLEXERIL) 10 MG tablet Take 1 tablet (10 mg total) by mouth at bedtime. 30 tablet 1  . fluconazole (DIFLUCAN) 150 MG tablet Take 150 mg by mouth. x2    . fluocinonide cream (LIDEX) 9.98 % Apply 1 application topically 2 (two) times daily.    . fluticasone (CUTIVATE) 0.05 % cream Apply 1 application topically 2 (two) times daily.    . fluticasone (FLONASE) 50 MCG/ACT nasal spray Place 2 sprays into both nostrils at bedtime. 16 g 1  . hydrochlorothiazide (HYDRODIURIL) 12.5 MG tablet Take 1 tablet (12.5 mg total) by mouth daily. 30 tablet 0  . ibuprofen (ADVIL,MOTRIN) 200 MG tablet Take 3 tablets (600 mg total) by mouth every 6 (six) hours as needed. For pain 30 tablet 0  . ketoconazole (NIZORAL) 2 % cream Apply 1 application topically daily.    . Multiple Vitamin (MULTIVITAMIN) tablet Take 1 tablet by mouth daily.    . naratriptan (AMERGE) 2.5 MG tablet Take 1 tablet (2.5 mg total) by mouth as needed. Take one (1) tablet at onset of headache; if returns or does not resolve, may repeat after 4 hours; do not exceed five (5) mg in 24 hours. Use w/3 20mg  prednisone 10 tablet 3  . Omega-3 Fatty Acids (FISH OIL) 1200 MG CAPS Take  1 capsule by mouth daily.    . Red Yeast Rice Extract 600 MG CAPS Take 1,200 mg by mouth daily.    . traZODone (DESYREL) 50 MG tablet Take 1 tablet (50 mg total) by mouth at bedtime. 10 tablet 1  . valACYclovir (VALTREX) 500 MG tablet Take 500 mg by mouth daily as needed. For cold sores    . valsartan (DIOVAN) 320 MG tablet Take 1 tablet (320 mg total) by mouth daily with breakfast. For BP 90 tablet 0   No current facility-administered medications on file prior to visit.   Health Maintenance:   Immunization History  Administered Date(s) Administered  . Influenza-Unspecified 06/22/2013  . Tdap 09/22/2006   Tetanus: 2008 Pneumovax: N/A Prevnar 13: N/A Flu vaccine: 2015 at work Zostavax: N/A LMP: 2013, TAH Pap: April 2016 Dr. Benjie Karvonen MGM: 06/2014 , CAT C DEXA: N/A Colonoscopy: 2011, due 10 years EGD: N/A Echo 2011  Last Dental Exam: Dr. [place] Sink Last Eye Exam: Dr. Herbert Deaner, wears glasses,  has been 2 years Patient Care Team: Unk Pinto, MD as PCP - General (Internal Medicine) Elsie Saas, MD as Consulting Physician (Orthopedic Surgery) Monna Fam, MD as Consulting Physician (Ophthalmology) Fay Records, MD as Consulting Physician (Cardiology) Inda Castle, MD as Consulting Physician (Gastroenterology) Azucena Fallen, MD as Consulting Physician (Obstetrics and Gynecology) Leeroy Cha, MD as Consulting Physician (Neurosurgery) Druscilla Brownie, MD as Consulting Physician (Dermatology)  Allergies:  Allergies  Allergen Reactions  . Penicillins     REACTION: hives   Medical History:  Past Medical History  Diagnosis Date  . HTN (hypertension)   . PONV (postoperative nausea and vomiting)   . 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 Hyperlipidemia   . Anxiety   . Migraine   . HSV-1 infection    Surgical History:  Past Surgical History  Procedure Laterality Date  . Tubal ligation    . Acl replacement    . Cervical disc surgery  2012  .  Robotic assisted total hysterectomy  09/10/2012    Procedure: ROBOTIC ASSISTED TOTAL HYSTERECTOMY;  Surgeon: Elveria Royals, MD;  Location: [facility];  Service: Gynecology;  Laterality: N/A;  3 hrs.  . Bilateral salpingectomy  09/10/2012    Procedure: BILATERAL SALPINGECTOMY;  Surgeon: Elveria Royals, MD;  Location: [facility];  Service: Gynecology;  Laterality: Bilateral;  . Elbow surgery Right 01/2014   Family History:  Family History  Problem Relation Age of Onset  . Alzheimer's disease Father   . Hypertension Mother   . Stroke Mother   . Cancer Mother   . Hyperlipidemia Brother   . Heart disease Brother   . Diabetes Brother   . Hypertension Brother    Social History:  Social History  Substance Use Topics  . Smoking status: Never Smoker   . Smokeless tobacco: Never Used     Comment: tobacco use - no   . Alcohol Use: Yes     Comment: rare    Review of Systems: Review of Systems  Constitutional: Negative.   HENT: Negative for congestion, ear discharge, ear pain, hearing loss, nosebleeds, sore throat and tinnitus.   Eyes: Negative.   Respiratory: Negative.  Negative for stridor.   Cardiovascular: Negative.   Gastrointestinal: Negative.   Genitourinary: Negative.   Musculoskeletal: Positive for myalgias, joint pain and neck pain. Negative for back pain and falls.  Skin: Negative.   Neurological: Positive for headaches. Negative for dizziness, tingling, tremors, sensory change, speech change, focal weakness, seizures and loss of consciousness.  Endo/Heme/Allergies: Negative.   Psychiatric/Behavioral: Negative.     Physical Exam: Estimated body mass index is 28.37 kg/(m^2) as calculated from the following:   Height as of this encounter: 5' 6.5" (1.689 m).   Weight as of this encounter: 178 lb 6.4 oz (80.922 kg). BP 158/98 mmHg  Pulse 84  Temp(Src) 97.7 F (36.5 C)  Resp 16  Ht 5' 6.5" (1.689 m)  Wt 178 lb 6.4 oz (80.922 kg)  BMI 28.37 kg/m2  LMP 09/05/2012  Wt Readings  from Last 3 Encounters:  05/23/15 178 lb 6.4 oz (80.922 kg)  11/01/14 182 lb (82.555 kg)  08/16/14 178 lb (80.74 kg)   General Appearance: Well nourished, in no apparent distress.  Eyes: PERRLA, EOMs, conjunctiva no swelling or erythema, normal fundi and vessels.  Sinuses: No Frontal/maxillary tenderness  ENT/Mouth: Ext aud canals clear, normal light reflex with TMs without erythema, bulging. Good dentition. No erythema, swelling, or exudate on post pharynx. Tonsils not swollen or  erythematous. Hearing normal.  Neck: Supple, thyroid normal. No bruits  Respiratory: Respiratory effort normal, BS equal bilaterally without rales, rhonchi, wheezing or stridor.  Cardio: RRR without murmurs, rubs or gallops. Brisk peripheral pulses without edema.  Chest: symmetric, with normal excursions and percussion.  Breasts: defer Abdomen: Soft, nontender, no guarding, rebound, hernias, masses, or organomegaly.  Lymphatics: Non tender without lymphadenopathy.  Genitourinary: defer Musculoskeletal: Full ROM all peripheral extremities,5/5 strength, and normal gait. Left shoulder with pain at bicipital tendon proximally with pain with abduction to 90 degrees and internal rotation. She has chronic neck pain, very pinpoint tenderness along traps.  Skin: Warm, dry without rashes, lesions, ecchymosis. Neuro: Cranial nerves intact, reflexes equal bilaterally. Normal muscle tone, no cerebellar symptoms. Sensation intact.  Psych: Awake and oriented X 3, normal affect, Insight and Judgment appropriate.   EKG: WNL no ST changes. AORTA SCAN: defer  [name] 9:14 AM [facility]

## 2015-05-23 NOTE — Patient Instructions (Addendum)
Encourage you to get the 3D Mammogram  The 3D Mammogram is much more specific and sensitive to pick up breast cancer. For women with fibrocystic breast or lumpy breast it can be hard to determine if it is cancer or not but the 3D mammogram is able to tell this difference which cuts back on unneeded additional tests or scary call backs.   - over 40% increase in detection of breast cancer - over 40% reduction in false positives.  - fewer call backs - reduced anxiety - improved outcomes - [facility]  Can add atenolol at night for BP, Headache, stress Or we can switch back to benicar  Biceps Tendon Tendinitis (Proximal) and Tenosynovitis with Rehab Tendonitis and tenosynovitis involve inflammation of the tendon and the tendon lining (sheath). The proximal biceps tendon is vulnerable to tendonitis and tenosynovitis, which causes pain and discomfort in the front of the shoulder and upper arm. The tendon lining secretes a fluid that helps lubricate the tendon, allowing for proper function without pain. When the tendon and its lining become inflamed, the tendon can no longer glide smoothly, causing pain. The proximal biceps tendon connects the biceps muscle to two bones of the shoulder. It is important for proper function of the elbow and turning the palm upward (supination) using the wrist. Proximal biceps tendon tendinitis may include a grade 1 or 2 strain of the tendon. Grade 1 strains involve a slight pull of the tendon without signs of tearing and no observed tendon lengthening. There is also no loss of strength. Grade 2 strains involve small tears in the tendon fibers. The tendon or muscle is stretched and strength is usually decreased.  SYMPTOMS   Pain, tenderness, swelling, warmth, or redness over the front of the shoulder.  Pain that gets worse with shoulder and elbow use, especially against resistance.  Limited motion of the shoulder or elbow.  Crackling sound (crepitation) when the  tendon or shoulder is moved or touched. CAUSES  The symptoms of biceps tendonitis are due to inflammation of the tendon. Inflammation may be caused by:  Strain from sudden increase in amount or intensity of activity.  Direct blow or injury to the elbow (uncommon).  Overuse or repetitive elbow bending or wrist rotation, particularly when turning the palm up, or with elbow hyperextension. RISK INCREASES WITH:  Sports that involve contact or overhead arm activity (throwing sports, gymnastics, weightlifting, bodybuilding, rock climbing).  Heavy labor.  Poor strength and flexibility.  Failure to warm up properly before activity. PREVENTION  Warm up and stretch properly before activity.  Allow time for recovery between activities.  Maintain physical fitness:  Strength, flexibility, and endurance.  Cardiovascular fitness.  Learn and use proper exercise technique. PROGNOSIS  With proper treatment, proximal biceps tendon tendonitis and tenosynovitis is usually curable within 6 weeks. Healing is usually quicker if the cause was a direct blow, not overuse.  RELATED COMPLICATIONS   Longer healing time if not properly treated or if not given enough time to heal.  Chronically inflamed tendon that causes persistent pain with activity, that may progress to constant pain and potentially rupture of the tendon.  Recurring symptoms, especially if activity is resumed too soon or with overuse, a direct blow, or use of poor exercise technique. TREATMENT Treatment first involves ice and medicine, to reduce pain and inflammation. It is helpful to modify activities that cause pain, to reduce the chances of causing the condition to get worse. Strengthening and stretching exercises should be performed to  promote proper use of the muscles of the shoulder. These exercises may be performed at home or with a therapist. Other treatments may be given such as ultrasound or heat therapy. A corticosteroid  injection may be recommended to help reduce inflammation of the tendon lining. Surgery is usually not necessary. Sometimes, if symptoms last for greater than 6 months, surgery will be advised to detach the tendon and re-insert it into the arm bone. Surgery to correct other shoulder problems that may be contributing to tendinitis may be advised before surgery for the tendinitis itself.  MEDICATION  If pain medicine is needed, nonsteroidal anti-inflammatory medicines (aspirin and ibuprofen), or other minor pain relievers (acetaminophen), are often advised.  Do not take pain medicine for 7 days before surgery.  Prescription pain relievers may be given if your caregiver thinks they are needed. Use only as directed and only as much as you need.  Corticosteroid injections may be given. These injections should only be used on the most severe cases, as one can only receive a limited number of them. HEAT AND COLD   Cold treatment (icing) should be applied for 10 to 15 minutes every 2 to 3 hours for inflammation and pain, and immediately after activity that aggravates your symptoms. Use ice packs or an ice massage.  Heat treatment may be used before performing stretching and strengthening activities prescribed by your caregiver, physical therapist, or athletic trainer. Use a heat pack or a warm water soak. SEEK MEDICAL CARE IF:   Symptoms get worse or do not improve in 2 weeks, despite treatment.  New, unexplained symptoms develop. (Drugs used in treatment may produce side effects.) EXERCISES RANGE OF MOTION (ROM) AND EXERCISES - Biceps Tendon (Proximal) and Tenosynovitis These exercises may help you when beginning to rehabilitate your injury. Your symptoms may go away with or without further involvement from your physician, physical therapist, or athletic trainer. While completing these exercises, remember:   Restoring tissue flexibility helps normal motion to return to the joints. This allows  healthier, less painful movement and activity.  An effective stretch should be held for at least 30 seconds.  A stretch should never be painful. You should only feel a gentle lengthening or release in the stretched tissue. STRETCH - Flexion, Standing  Stand with good posture. With an underhand grip on your right / left hand and an overhand grip on the opposite hand, grasp a broomstick or cane so that your hands are a little more than shoulder width apart.  Keeping your right / left elbow straight and shoulder muscles relaxed, push the stick with your opposite hand to raise your right / left arm in front of your body and then overhead. Raise your arm until you feel a stretch in your right / left shoulder, but before you have increased shoulder pain.  Try to avoid shrugging your right / left shoulder as your arm rises, by keeping your shoulder blade tucked down and toward your mid-back spine. Hold for __________ seconds.  Slowly return to the starting position. Repeat __________ times. Complete this exercise __________ times per day. STRETCH - Abduction, Supine  Lie on your back. With an underhand grip on your right / left hand and an overhand grip on the opposite hand, grasp a broomstick or cane so that your hands are a little more than shoulder width apart.  Keeping your right / left elbow straight and shoulder muscles relaxed, push the stick with your opposite hand to raise your right / left arm  out to the side of your body and then overhead. Raise your arm until you feel a stretch in your right / left shoulder, but before you have increased shoulder pain.  Try to avoid shrugging your right / left shoulder as your arm rises, by keeping your shoulder blade tucked down and toward your mid-back spine. Hold for __________ seconds.  Slowly return to the starting position. Repeat __________ times. Complete this exercise __________ times per day. ROM - Flexion, Active-Assisted  Lie on your back.  You may bend your knees for comfort.  Grasp a broomstick or cane so your hands are about shoulder width apart. Your right / left hand should grip the end of the stick so that your hand is positioned "thumbs-up," as if you were about to shake hands.  Using your healthy arm to lead, raise your right / left arm overhead until you feel a gentle stretch in your shoulder. Hold for __________ seconds.  Use the stick to assist in returning your right / left arm to its starting position. Repeat __________ times. Complete this exercise __________ times per day.  STRETCH - Flexion, Standing   Stand facing a wall. Walk your right / left fingers up the wall until you feel a moderate stretch in your shoulder. As your hand gets higher, you may need to step closer to the wall or use a door frame to walk through.  Try to avoid shrugging your right / left shoulder as your arm rises, by keeping your shoulder blade tucked down and toward your mid-back spine.  Hold for __________ seconds. Use your other hand, if needed, to ease out of the stretch and return to the starting position. Repeat __________ times. Complete this exercise __________ times per day.  ROM - Internal Rotation   Using underhand grips, grasp a stick behind your back with both hands.  While standing upright with good posture, slide the stick up your back until you feel a mild stretch in the front of your shoulder.  Hold for __________ seconds. Slowly return to your starting position. Repeat __________ times. Complete this exercise __________ times per day.  STRETCH - Internal Rotation  Place your right / left hand behind your back, palm-up.  Throw a towel or belt over your opposite shoulder. Grasp the towel with your right / left hand.  While keeping an upright posture, gently pull up on the towel until you feel a stretch in the front of your right / left shoulder.  Avoid shrugging your right / left shoulder as your arm rises, by keeping  your shoulder blade tucked down and toward your mid-back spine.  Hold for __________ seconds. Release the stretch by lowering your opposite hand. Repeat __________ times. Complete this exercise __________ times per day. STRENGTHENING EXERCISES - Biceps Tendon Tendinitis (Proximal) and Tenosynovitis These exercises may help you regain your strength after your physician has discontinued your restraint in a cast or brace. They may resolve your symptoms with or without further involvement from your physician, physical therapist or athletic trainer. While completing these exercises, remember:   Muscles can gain both the endurance and the strength needed for everyday activities through controlled exercises.  Complete these exercises as instructed by your physician, physical therapist or athletic trainer. Increase the resistance and repetitions only as guided.  You may experience muscle soreness or fatigue, but the pain or discomfort you are trying to eliminate should never worsen during these exercises. If this pain does get worse, stop and make sure  you are following the directions exactly. If the pain is still present after adjustments, discontinue the exercise until you can discuss the trouble with your caregiver. STRENGTH - Elbow Flexors, Isometric  Stand or sit upright on a firm surface. Place your right / left arm so that your hand is palm-up and at the height of your waist.  Place your opposite hand on top of your forearm. Gently push down as your right / left arm resists. Push as hard as you can with both arms, without causing any pain or movement at your right / left elbow. Hold this stationary position for __________ seconds.  Gradually release the tension in both arms. Allow your muscles to relax completely before repeating. Repeat __________ times. Complete this exercise __________ times per day. STRENGTH - Shoulder Flexion, Isometric  With good posture and facing a wall, stand or sit  about 4-6 inches away.  Keeping your right / left elbow straight, gently press the top of your fist into the wall. Increase the pressure gradually until you are pressing as hard as you can, without shrugging your shoulder or increasing any shoulder discomfort.  Hold for __________ seconds.  Release the tension slowly. Relax your shoulder muscles completely before you start the next repetition. Repeat __________ times. Complete this exercise __________ times per day.  STRENGTH - Elbow Flexors, Supinated  With good posture, stand or sit on a firm chair without armrests. Allow your right / left arm to rest at your side with your palm facing forward.  Holding a __________ weight, or gripping a rubber exercise band or tubing,  bring your hand toward your shoulder.  Allow your muscles to control the resistance as your hand returns to your side. Repeat __________ times. Complete this exercise __________ times per day.  STRENGTH - Shoulder Flexion  Stand or sit with good posture. Grasp a __________ weight, or an exercise band or tubing, so that your hand is "thumbs-up," like when you shake hands.  Slowly lift your right / left arm as far as you can, without increasing any shoulder pain. At first, many people can only raise their hand to shoulder height.  Avoid shrugging your right / left shoulder as your arm rises, by keeping your shoulder blade tucked down and toward your mid-back spine.  Hold for __________ seconds. Control the descent of your hand as you slowly return to your starting position. Repeat __________ times. Complete this exercise __________ times per day. Document Released: 09/08/2005 Document Revised: 12/01/2011 Document Reviewed: 12/21/2008 [facility] Patient Information 2015 [place], [place]. This information is not intended to replace advice given to you by your health care provider. Make sure you discuss any questions you have with your health care provider.

## 2015-05-24 LAB — CBC WITH DIFFERENTIAL/PLATELET
Basophils Absolute: 0.1 10*3/uL (ref 0.0–0.1)
Basophils Relative: 1 % (ref 0–1)
Eosinophils Absolute: 0.1 10*3/uL (ref 0.0–0.7)
Eosinophils Relative: 1 % (ref 0–5)
HCT: 41.6 % (ref 36.0–46.0)
Hemoglobin: 13.9 g/dL (ref 12.0–15.0)
Lymphocytes Relative: 22 % (ref 12–46)
Lymphs Abs: 1.4 10*3/uL (ref 0.7–4.0)
MCH: 30 pg (ref 26.0–34.0)
MCHC: 33.4 g/dL (ref 30.0–36.0)
MCV: 89.8 fL (ref 78.0–100.0)
MPV: 11 fL (ref 8.6–12.4)
Monocytes Absolute: 0.4 10*3/uL (ref 0.1–1.0)
Monocytes Relative: 6 % (ref 3–12)
Neutro Abs: 4.4 10*3/uL (ref 1.7–7.7)
Neutrophils Relative %: 70 % (ref 43–77)
Platelets: 293 10*3/uL (ref 150–400)
RBC: 4.63 MIL/uL (ref 3.87–5.11)
RDW: 13.4 % (ref 11.5–15.5)
WBC: 6.3 10*3/uL (ref 4.0–10.5)

## 2015-05-24 LAB — URINALYSIS, ROUTINE W REFLEX MICROSCOPIC
Bilirubin Urine: NEGATIVE
Glucose, UA: NEGATIVE
Hgb urine dipstick: NEGATIVE
Ketones, ur: NEGATIVE
Leukocytes, UA: NEGATIVE
Nitrite: NEGATIVE
Protein, ur: NEGATIVE
Specific Gravity, Urine: 1.007 (ref 1.001–1.035)
pH: 6 (ref 5.0–8.0)

## 2015-05-24 LAB — VITAMIN D 25 HYDROXY (VIT D DEFICIENCY, FRACTURES): Vit D, 25-Hydroxy: 38 ng/mL (ref 30–100)

## 2015-05-24 LAB — MICROALBUMIN / CREATININE URINE RATIO
Creatinine, Urine: 37.1 mg/dL
Microalb Creat Ratio: 18.9 mg/g (ref 0.0–30.0)
Microalb, Ur: 0.7 mg/dL (ref <2.0)

## 2015-05-24 LAB — INSULIN, FASTING: Insulin fasting, serum: 8.5 u[IU]/mL (ref 2.0–19.6)

## 2015-06-15 ENCOUNTER — Encounter: Payer: Self-pay | Admitting: Physician Assistant

## 2015-06-19 ENCOUNTER — Other Ambulatory Visit: Payer: Self-pay | Admitting: Physician Assistant

## 2015-07-17 ENCOUNTER — Other Ambulatory Visit: Payer: Self-pay

## 2015-07-17 ENCOUNTER — Other Ambulatory Visit: Payer: Self-pay | Admitting: Physician Assistant

## 2015-07-17 DIAGNOSIS — Z1231 Encounter for screening mammogram for malignant neoplasm of breast: Secondary | ICD-10-CM

## 2015-07-23 ENCOUNTER — Encounter: Payer: Self-pay | Admitting: Gastroenterology

## 2015-08-20 ENCOUNTER — Ambulatory Visit: Payer: BLUE CROSS/BLUE SHIELD

## 2015-08-29 ENCOUNTER — Ambulatory Visit (INDEPENDENT_AMBULATORY_CARE_PROVIDER_SITE_OTHER): Payer: BLUE CROSS/BLUE SHIELD | Admitting: Physician Assistant

## 2015-08-29 ENCOUNTER — Encounter: Payer: Self-pay | Admitting: Physician Assistant

## 2015-08-29 VITALS — BP 126/70 | HR 71 | Temp 97.2°F | Resp 16 | Ht 66.5 in | Wt 180.0 lb

## 2015-08-29 DIAGNOSIS — M25512 Pain in left shoulder: Secondary | ICD-10-CM

## 2015-08-29 DIAGNOSIS — E785 Hyperlipidemia, unspecified: Secondary | ICD-10-CM | POA: Diagnosis not present

## 2015-08-29 DIAGNOSIS — Z79899 Other long term (current) drug therapy: Secondary | ICD-10-CM

## 2015-08-29 DIAGNOSIS — E559 Vitamin D deficiency, unspecified: Secondary | ICD-10-CM

## 2015-08-29 DIAGNOSIS — R7303 Prediabetes: Secondary | ICD-10-CM

## 2015-08-29 DIAGNOSIS — G8929 Other chronic pain: Secondary | ICD-10-CM

## 2015-08-29 DIAGNOSIS — I10 Essential (primary) hypertension: Secondary | ICD-10-CM | POA: Diagnosis not present

## 2015-08-29 LAB — LIPID PANEL
Cholesterol: 185 mg/dL (ref 125–200)
HDL: 37 mg/dL — ABNORMAL LOW (ref >=46)
LDL Cholesterol: 116 mg/dL (ref <130)
Total CHOL/HDL Ratio: 5 Ratio (ref <=5.0)
Triglycerides: 161 mg/dL — ABNORMAL HIGH (ref <150)
VLDL: 32 mg/dL — ABNORMAL HIGH (ref <30)

## 2015-08-29 LAB — BASIC METABOLIC PANEL WITH GFR
BUN: 14 mg/dL (ref 7–25)
CO2: 26 mmol/L (ref 20–31)
Calcium: 9.1 mg/dL (ref 8.6–10.4)
Chloride: 102 mmol/L (ref 98–110)
Creat: 0.64 mg/dL (ref 0.50–1.05)
GFR, Est African American: >89 mL/min (ref >=60)
GFR, Est Non African American: >89 mL/min (ref >=60)
Glucose, Bld: 74 mg/dL (ref 65–99)
Potassium: 4 mmol/L (ref 3.5–5.3)
Sodium: 140 mmol/L (ref 135–146)

## 2015-08-29 LAB — CBC WITH DIFFERENTIAL/PLATELET
Basophils Absolute: 0.1 10*3/uL (ref 0.0–0.1)
Basophils Relative: 1 % (ref 0–1)
Eosinophils Absolute: 0.1 10*3/uL (ref 0.0–0.7)
Eosinophils Relative: 2 % (ref 0–5)
HCT: 39.4 % (ref 36.0–46.0)
Hemoglobin: 13.3 g/dL (ref 12.0–15.0)
Lymphocytes Relative: 25 % (ref 12–46)
Lymphs Abs: 1.8 10*3/uL (ref 0.7–4.0)
MCH: 30.2 pg (ref 26.0–34.0)
MCHC: 33.8 g/dL (ref 30.0–36.0)
MCV: 89.5 fL (ref 78.0–100.0)
MPV: 11.1 fL (ref 8.6–12.4)
Monocytes Absolute: 0.6 10*3/uL (ref 0.1–1.0)
Monocytes Relative: 8 % (ref 3–12)
Neutro Abs: 4.7 10*3/uL (ref 1.7–7.7)
Neutrophils Relative %: 64 % (ref 43–77)
Platelets: 277 10*3/uL (ref 150–400)
RBC: 4.4 MIL/uL (ref 3.87–5.11)
RDW: 12.9 % (ref 11.5–15.5)
WBC: 7.3 10*3/uL (ref 4.0–10.5)

## 2015-08-29 LAB — HEPATIC FUNCTION PANEL
ALT: 16 U/L (ref 6–29)
AST: 18 U/L (ref 10–35)
Albumin: 3.7 g/dL (ref 3.6–5.1)
Alkaline Phosphatase: 57 U/L (ref 33–130)
Bilirubin, Direct: 0.1 mg/dL (ref <=0.2)
Indirect Bilirubin: 0.3 mg/dL (ref 0.2–1.2)
Total Bilirubin: 0.4 mg/dL (ref 0.2–1.2)
Total Protein: 6.4 g/dL (ref 6.1–8.1)

## 2015-08-29 NOTE — Progress Notes (Signed)
Assessment and Plan:  Hypertension: Continue medication, monitor blood pressure at home. Continue DASH diet.  Reminder to go to the ER if any CP, SOB, nausea, dizziness, severe HA, changes vision/speech, left arm numbness and tingling, and jaw pain. Cholesterol: Continue diet and exercise. Check cholesterol.  Pre-diabetes-Continue diet and exercise. Check A1C Vitamin D Def- check level and continue medications.  Chronic left shoulder pain- continue PT, if not better will need MRI  Continue diet and meds as discussed. Further disposition pending results of labs. Future Appointments Date Time Provider [place]  09/14/2015 7:50 AM [facility]    HPI 54 y.o. female  presents for 3 month follow up with hypertension, hyperlipidemia, prediabetes and vitamin D. Her blood pressure has been controlled at home, last visit atenolol was added for BP and migraine prevention, today her BP is BP: 126/70 mmHg She does not workout, walking some, not working out due to left shoulder pain. She denies chest pain, shortness of breath, dizziness.  She is not on cholesterol medication. Her cholesterol is at goal. The cholesterol last visit was:   Lab Results  Component Value Date   CHOL 200 05/23/2015   HDL 35* 05/23/2015   LDLCALC 130* 05/23/2015   TRIG 175* 05/23/2015   CHOLHDL 5.7* 05/23/2015   She has been working on diet and exercise for prediabetes, and denies paresthesia of the feet, polydipsia, polyuria and visual disturbances. Last A1C in the office was:  Lab Results  Component Value Date   HGBA1C 5.6 05/23/2015   Patient is on Vitamin D supplement.   Lab Results  Component Value Date   VD25OH 38 05/23/2015   Migraines have improved with atenolol, takes 1/2 at night.  She went to PT for neck/left shoulder pain, her neck pain has improved but still has limited ROM with left shoulder. Has pain posterior shoulder with external rotation and abduction.  BMI is Body  mass index is 28.62 kg/(m^2)., she is working on diet and exercise. Wt Readings from Last 3 Encounters:  08/29/15 180 lb (81.647 kg)  05/23/15 178 lb 6.4 oz (80.922 kg)  11/01/14 182 lb (82.555 kg)    Current Medications:  Current Outpatient Prescriptions on File Prior to Visit  Medication Sig Dispense Refill  . ALPRAZolam (XANAX) 0.5 MG tablet Take 1 tablet (0.5 mg total) by mouth 2 (two) times daily as needed for anxiety. 60 tablet 0  . Ascorbic Acid (VITAMIN C) 1000 MG tablet Take 1,000 mg by mouth as needed. In winter for colds    . aspirin 81 MG EC tablet Take 81 mg by mouth daily.      [name] atenolol (TENORMIN) 50 MG tablet 1/2-1 pill at night for blood pressure 30 tablet 11  . cholecalciferol (VITAMIN D) 1000 UNITS tablet Take 1,000 Units by mouth daily.    [name] Co-Enzyme Q-10 100 MG CAPS Take 1 capsule by mouth daily.    . fluconazole (DIFLUCAN) 150 MG tablet Take 150 mg by mouth. x2    . fluocinonide cream (LIDEX) [id number] % Apply 1 application topically 2 (two) times daily.    . fluticasone (CUTIVATE) 0.05 % cream Apply 1 application topically 2 (two) times daily.    . fluticasone (FLONASE) 50 MCG/ACT nasal spray Place 2 sprays into both nostrils at bedtime. 16 g 1  . hydrochlorothiazide (HYDRODIURIL) 12.5 MG tablet Take 1 tablet (12.5 mg total) by mouth daily. 30 tablet 0  . ibuprofen (ADVIL,MOTRIN) 200 MG tablet Take 3 tablets (600  mg total) by mouth every 6 (six) hours as needed. For pain 30 tablet 0  . ketoconazole (NIZORAL) 2 % cream Apply 1 application topically daily.    . meloxicam (MOBIC) 15 MG tablet Take 1/2 to 1 tablet daily with food for pain & inflammation 90 tablet 1  . Multiple Vitamin (MULTIVITAMIN) tablet Take 1 tablet by mouth daily.    . naratriptan (AMERGE) 2.5 MG tablet Take 1 tablet (2.5 mg total) by mouth as needed. Take one (1) tablet at onset of headache; if returns or does not resolve, may repeat after 4 hours; do not exceed five (5) mg in 24 hours. Use w/3 20mg   prednisone 10 tablet 3  . Omega-3 Fatty Acids (FISH OIL) 1200 MG CAPS Take 1 capsule by mouth daily.    . Red Yeast Rice Extract 600 MG CAPS Take 1,200 mg by mouth daily.    . traZODone (DESYREL) 50 MG tablet Take 1 tablet (50 mg total) by mouth at bedtime. 10 tablet 1  . valACYclovir (VALTREX) 500 MG tablet Take 500 mg by mouth daily as needed. For cold sores    . valsartan (DIOVAN) 320 MG tablet Take 1 tablet (320 mg total) by mouth daily with breakfast. For BP 90 tablet 0   No current facility-administered medications on file prior to visit.   Medical History:  Past Medical History  Diagnosis Date  . HTN (hypertension)   . PONV (postoperative nausea and vomiting)   . 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 Hyperlipidemia   . Anxiety   . Migraine   . HSV-1 infection    Allergies:  Allergies  Allergen Reactions  . Penicillins     REACTION: hives   Review of Systems  Constitutional: Negative.   HENT: Negative for congestion, ear discharge, ear pain, hearing loss, nosebleeds, sore throat and tinnitus.   Eyes: Negative.   Respiratory: Negative.  Negative for stridor.   Cardiovascular: Negative.   Gastrointestinal: Negative.   Genitourinary: Negative.   Musculoskeletal: Positive for myalgias, joint pain and neck pain. Negative for back pain and falls.  Skin: Negative.   Neurological: Positive for headaches. Negative for dizziness, tingling, tremors, sensory change, speech change, focal weakness, seizures and loss of consciousness.  Endo/Heme/Allergies: Negative.   Psychiatric/Behavioral: Negative.      Family history- Review and unchanged Social history- Review and unchanged Physical Exam: BP 126/70 mmHg  Pulse 71  Temp(Src) 97.2 F (36.2 C) (Temporal)  Resp 16  Ht 5' 6.5" (1.689 m)  Wt 180 lb (81.647 kg)  BMI 28.62 kg/m2  SpO2 97%  LMP 09/05/2012 Wt Readings from Last 3 Encounters:  08/29/15 180 lb (81.647 kg)  05/23/15 178 lb 6.4 oz (80.922  kg)  11/01/14 182 lb (82.555 kg)   General Appearance: Well nourished, in no apparent distress. Eyes: PERRLA, EOMs, conjunctiva no swelling or erythema Sinuses: No Frontal/maxillary tenderness ENT/Mouth: Ext aud canals clear, TMs without erythema, bulging. No erythema, swelling, or exudate on post pharynx.  Tonsils not swollen or erythematous. Hearing normal. + TMJ tenderness Neck: Supple, thyroid normal.  Respiratory: Respiratory effort normal, BS equal bilaterally without rales, rhonchi, wheezing or stridor.  Cardio: RRR with no MRGs. Brisk peripheral pulses without edema.  Abdomen: Soft, + BS.  Non tender, no guarding, rebound, hernias, masses. Lymphatics: Non tender without lymphadenopathy.  Musculoskeletal: Full ROM, 5/5 strength, normal gait. Good pulses, good sensation bilaterally. Left shoulder with diffuse tenderness, limited abduction and external/internal rotation due to pain.  Skin: Warm, dry  without rashes, lesions, ecchymosis.  Neuro: Cranial nerves intact. Normal muscle tone, no cerebellar symptoms. Sensation intact.  Psych: Awake and oriented X 3, normal affect, Insight and Judgment appropriate.    [name], PA-C 9:02 AM [facility]

## 2015-08-29 NOTE — Patient Instructions (Signed)
Benefiber is good for constipation/diarrhea/irritable bowel syndrome, it helps with weight loss and can help lower your bad cholesterol. Please do 1-2 TBSP in the morning in water, coffee, or tea. It can take up to a month before you can see a difference with your bowel movements. It is cheapest from [employer], [employer], [employer].   Cholesterol Cholesterol is a white, waxy, fat-like substance needed by your body in small amounts. The liver makes all the cholesterol you need. Cholesterol is carried from the liver by the blood through the blood vessels. Deposits of cholesterol (plaque) may build up on blood vessel walls. These make the arteries narrower and stiffer. Cholesterol plaques increase the risk for heart attack and stroke.  You cannot feel your cholesterol level even if it is very high. The only way to know it is high is with a blood test. Once you know your cholesterol levels, you should keep a record of the test results. Work with your health care provider to keep your levels in the desired range.  WHAT DO THE RESULTS MEAN?  Total cholesterol is a rough measure of all the cholesterol in your blood.   LDL is the so-called bad cholesterol. This is the type that deposits cholesterol in the walls of the arteries. You want this level to be low.   HDL is the good cholesterol because it cleans the arteries and carries the LDL away. You want this level to be high.  Triglycerides are fat that the body can either burn for energy or store. High levels are closely linked to heart disease.  WHAT ARE THE DESIRED LEVELS OF CHOLESTEROL?  Total cholesterol below 200.   LDL below 100 for people at risk, below 70 for those at very high risk.   HDL above 50 is good, above 60 is best.   Triglycerides below 150.  HOW CAN I LOWER MY CHOLESTEROL?  Diet. Follow your diet programs as directed by your health care provider.   Choose fish or white meat chicken and [place], roasted or baked. Limit fatty cuts  of red meat, fried foods, and processed meats, such as sausage and lunch meats.   Eat lots of fresh fruits and vegetables.  Choose whole grains, beans, pasta, potatoes, and cereals.   Use only small amounts of olive, corn, or canola oils.   Avoid butter, mayonnaise, shortening, or palm kernel oils.  Avoid foods with trans fats.   Drink skim or nonfat milk and eat low-fat or nonfat yogurt and cheeses. Avoid whole milk, cream, ice cream, egg yolks, and full-fat cheeses.   Healthy desserts include angel food cake, ginger snaps, animal crackers, hard candy, popsicles, and low-fat or nonfat frozen yogurt. Avoid pastries, cakes, pies, and cookies.   Exercise. Follow your exercise programs as directed by your health care provider.   A regular program helps decrease LDL and raise HDL.   A regular program helps with weight control.   Do things that increase your activity level like gardening, walking, or taking the stairs. Ask your health care provider about how you can be more active in your daily life.   Medicine. Take medicine only as directed by your health care provider.   Medicine may be prescribed by your health care provider to help lower cholesterol and decrease the risk for heart disease.   If you have several risk factors, you may need medicine even if your levels are normal.   This information is not intended to replace advice given to you by your  health care provider. Make sure you discuss any questions you have with your health care provider.   Document Released: 06/03/2001 Document Revised: 09/29/2014 Document Reviewed: 06/22/2013 Elsevier Interactive Patient Education [employer].

## 2015-09-14 ENCOUNTER — Ambulatory Visit: Payer: BLUE CROSS/BLUE SHIELD

## 2015-09-20 ENCOUNTER — Other Ambulatory Visit: Payer: Self-pay | Admitting: Internal Medicine

## 2015-11-05 ENCOUNTER — Ambulatory Visit: Payer: BLUE CROSS/BLUE SHIELD

## 2015-11-06 ENCOUNTER — Ambulatory Visit
Admission: RE | Admit: 2015-11-06 | Discharge: 2015-11-06 | Disposition: A | Discharge status: Home / Self Care | Payer: BLUE CROSS/BLUE SHIELD | Source: Ambulatory Visit

## 2015-11-06 DIAGNOSIS — Z1231 Encounter for screening mammogram for malignant neoplasm of breast: Secondary | ICD-10-CM

## 2016-03-03 ENCOUNTER — Encounter: Payer: Self-pay | Admitting: Physician Assistant

## 2016-03-03 ENCOUNTER — Ambulatory Visit (INDEPENDENT_AMBULATORY_CARE_PROVIDER_SITE_OTHER): Payer: BLUE CROSS/BLUE SHIELD | Admitting: Physician Assistant

## 2016-03-03 VITALS — BP 130/92 | HR 64 | Temp 98.0°F | Resp 16 | Ht 66.5 in | Wt 184.0 lb

## 2016-03-03 DIAGNOSIS — R7303 Prediabetes: Secondary | ICD-10-CM

## 2016-03-03 DIAGNOSIS — Z79899 Other long term (current) drug therapy: Secondary | ICD-10-CM | POA: Diagnosis not present

## 2016-03-03 DIAGNOSIS — E785 Hyperlipidemia, unspecified: Secondary | ICD-10-CM

## 2016-03-03 DIAGNOSIS — I10 Essential (primary) hypertension: Secondary | ICD-10-CM | POA: Diagnosis not present

## 2016-03-03 DIAGNOSIS — E559 Vitamin D deficiency, unspecified: Secondary | ICD-10-CM

## 2016-03-03 LAB — LIPID PANEL
Cholesterol: 191 mg/dL (ref 125–200)
HDL: 39 mg/dL — ABNORMAL LOW (ref >=46)
LDL Cholesterol: 119 mg/dL (ref <130)
Total CHOL/HDL Ratio: 4.9 Ratio (ref <=5.0)
Triglycerides: 165 mg/dL — ABNORMAL HIGH (ref <150)
VLDL: 33 mg/dL — ABNORMAL HIGH (ref <30)

## 2016-03-03 LAB — HEPATIC FUNCTION PANEL
ALT: 19 U/L (ref 6–29)
AST: 20 U/L (ref 10–35)
Albumin: 4.4 g/dL (ref 3.6–5.1)
Alkaline Phosphatase: 73 U/L (ref 33–130)
Bilirubin, Direct: 0.1 mg/dL (ref <=0.2)
Indirect Bilirubin: 0.4 mg/dL (ref 0.2–1.2)
Total Bilirubin: 0.5 mg/dL (ref 0.2–1.2)
Total Protein: 6.7 g/dL (ref 6.1–8.1)

## 2016-03-03 LAB — CBC WITH DIFFERENTIAL/PLATELET
Basophils Absolute: 62 cells/uL (ref 0–200)
Basophils Relative: 1 %
Eosinophils Absolute: 186 cells/uL (ref 15–500)
Eosinophils Relative: 3 %
HCT: 38.8 % (ref 35.0–45.0)
Hemoglobin: 13.5 g/dL (ref 11.7–15.5)
Lymphocytes Relative: 25 %
Lymphs Abs: 1550 cells/uL (ref 850–3900)
MCH: 30.5 pg (ref 27.0–33.0)
MCHC: 34.8 g/dL (ref 32.0–36.0)
MCV: 87.8 fL (ref 80.0–100.0)
MPV: 11.1 fL (ref 7.5–12.5)
Monocytes Absolute: 372 cells/uL (ref 200–950)
Monocytes Relative: 6 %
Neutro Abs: 4030 cells/uL (ref 1500–7800)
Neutrophils Relative %: 65 %
Platelets: 272 10*3/uL (ref 140–400)
RBC: 4.42 MIL/uL (ref 3.80–5.10)
RDW: 13 % (ref 11.0–15.0)
WBC: 6.2 10*3/uL (ref 3.8–10.8)

## 2016-03-03 LAB — BASIC METABOLIC PANEL WITH GFR
BUN: 17 mg/dL (ref 7–25)
CO2: 25 mmol/L (ref 20–31)
Calcium: 9.7 mg/dL (ref 8.6–10.4)
Chloride: 104 mmol/L (ref 98–110)
Creat: 0.77 mg/dL (ref 0.50–1.05)
GFR, Est African American: >89 mL/min (ref >=60)
GFR, Est Non African American: 87 mL/min (ref >=60)
Glucose, Bld: 97 mg/dL (ref 65–99)
Potassium: 4.2 mmol/L (ref 3.5–5.3)
Sodium: 139 mmol/L (ref 135–146)

## 2016-03-03 LAB — HEMOGLOBIN A1C
Hgb A1c MFr Bld: 5.8 % — ABNORMAL HIGH (ref <5.7)
Mean Plasma Glucose: 120 mg/dL

## 2016-03-03 LAB — TSH: TSH: 2.33 mIU/L

## 2016-03-03 LAB — MAGNESIUM: Magnesium: 2.2 mg/dL (ref 1.5–2.5)

## 2016-03-03 LAB — VITAMIN D 25 HYDROXY (VIT D DEFICIENCY, FRACTURES): Vit D, 25-Hydroxy: 43 ng/mL (ref 30–100)

## 2016-03-03 NOTE — Patient Instructions (Signed)
Can try prozac 10mg  or lexapro 10mg  for hot flashes

## 2016-03-03 NOTE — Progress Notes (Signed)
Assessment and Plan:  Hypertension: Continue medication, monitor blood pressure at home. Continue DASH diet.  Reminder to go to the ER if any CP, SOB, nausea, dizziness, severe HA, changes vision/speech, left arm numbness and tingling, and jaw pain. Cholesterol: Continue diet and exercise. Check cholesterol.  Pre-diabetes-Continue diet and exercise. Check A1C Vitamin D Def- check level and continue medications.  Hot flashes- continue estrogen with bASA for now, discussed prozac 10 and lexapro 10 as possibilities.   Continue diet and meds as discussed. Further disposition pending results of labs. No future appointments.  HPI 55 y.o. female  presents for 3 month follow up with hypertension, hyperlipidemia, prediabetes and vitamin D. Her blood pressure has been controlled at home, last visit atenolol was added for BP and migraine prevention, today her BP is BP: (!) 130/92 mmHg She does workout, walking some everyday.  She denies chest pain, shortness of breath, dizziness.  She is not on cholesterol medication. Her cholesterol is at goal. The cholesterol last visit was:   Lab Results  Component Value Date   CHOL 185 08/29/2015   HDL 37* 08/29/2015   LDLCALC 116 08/29/2015   TRIG 161* 08/29/2015   CHOLHDL 5.0 08/29/2015   She has been working on diet and exercise for prediabetes, and denies paresthesia of the feet, polydipsia, polyuria and visual disturbances. Last A1C in the office was:  Lab Results  Component Value Date   HGBA1C 5.6 05/23/2015   Patient is on Vitamin D supplement.   Lab Results  Component Value Date   VD25OH 38 05/23/2015   Migraines have improved with atenolol, takes 1/2 at night.  BMI is Body mass index is 29.26 kg/(m^2)., she is working on diet and exercise. Wt Readings from Last 3 Encounters:  03/03/16 184 lb (83.462 kg)  08/29/15 180 lb (81.647 kg)  05/23/15 178 lb 6.4 oz (80.922 kg)    Current Medications:  Current Outpatient Prescriptions on File Prior  to Visit  Medication Sig Dispense Refill  . ALPRAZolam (XANAX) 0.5 MG tablet Take 1 tablet (0.5 mg total) by mouth 2 (two) times daily as needed for anxiety. 60 tablet 0  . Ascorbic Acid (VITAMIN C) 1000 MG tablet Take 1,000 mg by mouth as needed. In winter for colds    . aspirin 81 MG EC tablet Take 81 mg by mouth daily.      [name] atenolol (TENORMIN) 50 MG tablet 1/2-1 pill at night for blood pressure 30 tablet 11  . cholecalciferol (VITAMIN D) 1000 UNITS tablet Take 1,000 Units by mouth daily.    [name] Co-Enzyme Q-10 100 MG CAPS Take 1 capsule by mouth daily.    . fluconazole (DIFLUCAN) 150 MG tablet Take 150 mg by mouth. x2    . fluocinonide cream (LIDEX) [id number] % Apply 1 application topically 2 (two) times daily.    . fluticasone (CUTIVATE) 0.05 % cream Apply 1 application topically 2 (two) times daily.    . fluticasone (FLONASE) 50 MCG/ACT nasal spray Place 2 sprays into both nostrils at bedtime. 16 g 1  . hydrochlorothiazide (HYDRODIURIL) 12.5 MG tablet Take 1 tablet (12.5 mg total) by mouth daily. 30 tablet 0  . ibuprofen (ADVIL,MOTRIN) 200 MG tablet Take 3 tablets (600 mg total) by mouth every 6 (six) hours as needed. For pain 30 tablet 0  . ketoconazole (NIZORAL) 2 % cream Apply 1 application topically daily.    . Multiple Vitamin (MULTIVITAMIN) tablet Take 1 tablet by mouth daily.    . naratriptan (AMERGE) 2.5  MG tablet Take 1 tablet (2.5 mg total) by mouth as needed. Take one (1) tablet at onset of headache; if returns or does not resolve, may repeat after 4 hours; do not exceed five (5) mg in 24 hours. Use w/3 20mg  prednisone 10 tablet 3  . Omega-3 Fatty Acids (FISH OIL) 1200 MG CAPS Take 1 capsule by mouth daily.    . valACYclovir (VALTREX) 500 MG tablet Take 500 mg by mouth daily as needed. For cold sores     No current facility-administered medications on file prior to visit.   Medical History:  Past Medical History  Diagnosis Date  . HTN (hypertension)   . PONV (postoperative nausea  and vomiting)   . 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 Hyperlipidemia   . Anxiety   . Migraine   . HSV-1 infection    Allergies:  Allergies  Allergen Reactions  . Penicillins     REACTION: hives   Review of Systems  Constitutional: Negative.        Hot flashes day and night  HENT: Negative for congestion, ear discharge, ear pain, hearing loss, nosebleeds, sore throat and tinnitus.   Eyes: Negative.   Respiratory: Negative.  Negative for stridor.   Cardiovascular: Negative.   Gastrointestinal: Negative.   Genitourinary: Negative.   Musculoskeletal: Positive for neck pain. Negative for myalgias, back pain, joint pain and falls.  Skin: Negative.   Neurological: Positive for headaches. Negative for dizziness, tingling, tremors, sensory change, speech change, focal weakness, seizures and loss of consciousness.  Endo/Heme/Allergies: Negative.   Psychiatric/Behavioral: Negative.     Family history- Review and unchanged Social history- Review and unchanged Physical Exam: BP 130/92 mmHg  Pulse 64  Temp(Src) 98 F (36.7 C) (Temporal)  Resp 16  Ht 5' 6.5" (1.689 m)  Wt 184 lb (83.462 kg)  BMI 29.26 kg/m2  LMP 09/05/2012 Wt Readings from Last 3 Encounters:  03/03/16 184 lb (83.462 kg)  08/29/15 180 lb (81.647 kg)  05/23/15 178 lb 6.4 oz (80.922 kg)   General Appearance: Well nourished, in no apparent distress. Eyes: PERRLA, EOMs, conjunctiva no swelling or erythema Sinuses: No Frontal/maxillary tenderness ENT/Mouth: Ext aud canals clear, TMs without erythema, bulging. No erythema, swelling, or exudate on post pharynx.  Tonsils not swollen or erythematous. Hearing normal.  Neck: Supple, thyroid normal.  Respiratory: Respiratory effort normal, BS equal bilaterally without rales, rhonchi, wheezing or stridor.  Cardio: RRR with no MRGs. Brisk peripheral pulses without edema.  Abdomen: Soft, + BS.  Non tender, no guarding, rebound, hernias,  masses. Lymphatics: Non tender without lymphadenopathy.  Musculoskeletal: Full ROM, 5/5 strength, normal gait. Good pulses, good sensation bilaterally.  Skin: Warm, dry without rashes, lesions, ecchymosis.  Neuro: Cranial nerves intact. Normal muscle tone, no cerebellar symptoms. Sensation intact.  Psych: Awake and oriented X 3, normal affect, Insight and Judgment appropriate.    [name], PA-C 8:59 AM [facility]

## 2016-04-23 ENCOUNTER — Other Ambulatory Visit: Payer: Self-pay | Admitting: Physician Assistant

## 2016-04-23 ENCOUNTER — Other Ambulatory Visit: Payer: Self-pay | Admitting: Internal Medicine

## 2016-04-23 MED ORDER — OLMESARTAN MEDOXOMIL 40 MG PO TABS: ORAL_TABLET | ORAL | 1 refills | Status: DC

## 2016-06-23 ENCOUNTER — Other Ambulatory Visit: Payer: Self-pay | Admitting: *Deleted

## 2016-06-23 MED ORDER — OLMESARTAN MEDOXOMIL 40 MG PO TABS: ORAL_TABLET | ORAL | 1 refills | Status: DC

## 2016-06-24 ENCOUNTER — Encounter: Payer: Self-pay | Admitting: Physician Assistant

## 2016-07-25 ENCOUNTER — Ambulatory Visit (INDEPENDENT_AMBULATORY_CARE_PROVIDER_SITE_OTHER): Payer: BLUE CROSS/BLUE SHIELD | Admitting: Physician Assistant

## 2016-07-25 ENCOUNTER — Other Ambulatory Visit: Payer: Self-pay

## 2016-07-25 ENCOUNTER — Encounter: Payer: Self-pay | Admitting: Physician Assistant

## 2016-07-25 VITALS — BP 140/90 | HR 72 | Temp 97.2°F | Resp 14 | Ht 66.5 in | Wt 188.2 lb

## 2016-07-25 DIAGNOSIS — R9431 Abnormal electrocardiogram [ECG] [EKG]: Secondary | ICD-10-CM

## 2016-07-25 DIAGNOSIS — M5030 Other cervical disc degeneration, unspecified cervical region: Secondary | ICD-10-CM

## 2016-07-25 DIAGNOSIS — E559 Vitamin D deficiency, unspecified: Secondary | ICD-10-CM | POA: Diagnosis not present

## 2016-07-25 DIAGNOSIS — I10 Essential (primary) hypertension: Secondary | ICD-10-CM | POA: Diagnosis not present

## 2016-07-25 DIAGNOSIS — R6889 Other general symptoms and signs: Secondary | ICD-10-CM

## 2016-07-25 DIAGNOSIS — R51 Headache: Secondary | ICD-10-CM

## 2016-07-25 DIAGNOSIS — Z79899 Other long term (current) drug therapy: Secondary | ICD-10-CM | POA: Diagnosis not present

## 2016-07-25 DIAGNOSIS — C44311 Basal cell carcinoma of skin of nose: Secondary | ICD-10-CM | POA: Diagnosis not present

## 2016-07-25 DIAGNOSIS — Z136 Encounter for screening for cardiovascular disorders: Secondary | ICD-10-CM

## 2016-07-25 DIAGNOSIS — R7303 Prediabetes: Secondary | ICD-10-CM

## 2016-07-25 DIAGNOSIS — E785 Hyperlipidemia, unspecified: Secondary | ICD-10-CM | POA: Diagnosis not present

## 2016-07-25 DIAGNOSIS — N8501 Benign endometrial hyperplasia: Secondary | ICD-10-CM

## 2016-07-25 DIAGNOSIS — R519 Headache, unspecified: Secondary | ICD-10-CM

## 2016-07-25 DIAGNOSIS — Z0001 Encounter for general adult medical examination with abnormal findings: Secondary | ICD-10-CM

## 2016-07-25 DIAGNOSIS — D649 Anemia, unspecified: Secondary | ICD-10-CM

## 2016-07-25 LAB — CBC WITH DIFFERENTIAL/PLATELET
Basophils Absolute: 88 cells/uL (ref 0–200)
Basophils Relative: 1 %
Eosinophils Absolute: 176 cells/uL (ref 15–500)
Eosinophils Relative: 2 %
HCT: 39.4 % (ref 35.0–45.0)
Hemoglobin: 13.6 g/dL (ref 11.7–15.5)
Lymphocytes Relative: 19 %
Lymphs Abs: 1672 cells/uL (ref 850–3900)
MCH: 30.9 pg (ref 27.0–33.0)
MCHC: 34.5 g/dL (ref 32.0–36.0)
MCV: 89.5 fL (ref 80.0–100.0)
MPV: 10.8 fL (ref 7.5–12.5)
Monocytes Absolute: 528 cells/uL (ref 200–950)
Monocytes Relative: 6 %
Neutro Abs: 6336 cells/uL (ref 1500–7800)
Neutrophils Relative %: 72 %
Platelets: 335 10*3/uL (ref 140–400)
RBC: 4.4 MIL/uL (ref 3.80–5.10)
RDW: 13 % (ref 11.0–15.0)
WBC: 8.8 10*3/uL (ref 3.8–10.8)

## 2016-07-25 LAB — HEMOGLOBIN A1C
Hgb A1c MFr Bld: 5.5 % (ref <5.7)
Mean Plasma Glucose: 111 mg/dL

## 2016-07-25 LAB — FERRITIN: Ferritin: 47 ng/mL (ref 10–232)

## 2016-07-25 LAB — VITAMIN B12: Vitamin B-12: 647 pg/mL (ref 200–1100)

## 2016-07-25 LAB — TSH: TSH: 2.31 mIU/L

## 2016-07-25 MED ORDER — NARATRIPTAN HCL 2.5 MG PO TABS: 2.5000 mg | ORAL_TABLET | ORAL | 3 refills | Status: DC | PRN

## 2016-07-25 MED ORDER — PREDNISONE 20 MG PO TABS: ORAL_TABLET | ORAL | 0 refills | Status: DC

## 2016-07-25 NOTE — Progress Notes (Signed)
Complete Physical  Assessment and Plan: Essential hypertension - continue medications, add on atenolol at night for BP/HA, DASH diet, exercise and monitor at home. Call if greater than 130/80.  ADD ON ATENOLOL DAILY FOR HTN AND HA - CBC with Differential/Platelet - BASIC METABOLIC PANEL WITH GFR - Hepatic function panel - TSH - Urinalysis, Routine w reflex microscopic (not at [facility]) - Microalbumin / creatinine urine ratio - EKG 12-Lead   Hyperlipidemia -continue medications, check lipids, decrease fatty foods, increase activity.  - Lipid panel   ABNORMAL EKG Will monitor, normal echo 2011 - EKG 12-Lead   Endometrial hyperplasia without atypia, simple S/p TAH  Prediabetes Discussed general issues about diabetes pathophysiology and management., Educational material distributed., Suggested low cholesterol diet., Encouraged aerobic exercise., Discussed foot care., Reminded to get yearly retinal exam. - Hemoglobin A1c - Insulin, fasting  Medication management - Magnesium  Vitamin D deficiency - Vit D  25 hydroxy (rtn osteoporosis monitoring)  Encounter for general adult medical examination with abnormal findings - CBC with Differential/Platelet - BASIC METABOLIC PANEL WITH GFR - Hepatic function panel - TSH - Lipid panel - Hemoglobin A1c - Insulin, fasting - Magnesium - Vit D  25 hydroxy (rtn osteoporosis monitoring) - Urinalysis, Routine w reflex microscopic (not at [facility]) - Microalbumin / creatinine urine ratio - Ferritin - Iron and TIBC - Vitamin B12 - EKG 12-Lead  DDD (degenerative disc disease), cervical Chronic pain, suggest PT, mobic  Basal cell carcinoma of nose Follow up DERM  Other migraine without status migrainosus, not intractable Possible from BP/TMJ- normal neuro- add on atenolol daily not as needed, and follow up as needed   Anemia, unspecified anemia type - Ferritin - Iron and TIBC - Vitamin B12  Discussed med's effects and SE's. Screening  labs and tests as requested with regular follow-up as recommended. Over 40 minutes of exam, counseling, chart review, and complex, high level critical decision making was performed this visit.   HPI  55 y.o. female  presents for a complete physical.  Her blood pressure has not been controlled at home for 1 month with HA, no changes in vision, no changes in speech, she is on valsartan for BP and was suppose to be on atenolol but has not been taking, today their BP is BP: 140/90 She does not workout. She denies chest pain, shortness of breath, dizziness.  Her estrogen patch was increased and she is feeling better but having more headaches and some swelling her legs.  She is not on cholesterol medication and denies myalgias. Her cholesterol is at goal. The cholesterol last visit was:   Lab Results  Component Value Date   CHOL 191 03/03/2016   HDL 39 (L) 03/03/2016   LDLCALC 119 03/03/2016   TRIG 165 (H) 03/03/2016   CHOLHDL 4.9 03/03/2016   She has been working on diet and exercise for prediabetes, and denies paresthesia of the feet, polydipsia, polyuria and visual disturbances. Last A1C in the office was:  Lab Results  Component Value Date   HGBA1C 5.8 (H) 03/03/2016   Patient is on Vitamin D supplement.   She has history of migraines, works for [employer].  Current Medications:  Current Outpatient Prescriptions on File Prior to Visit  Medication Sig Dispense Refill  . ALPRAZolam (XANAX) 0.5 MG tablet Take 1 tablet (0.5 mg total) by mouth 2 (two) times daily as needed for anxiety. 60 tablet 0  . Ascorbic Acid (VITAMIN C) 1000 MG tablet Take 1,000 mg by mouth as needed. In  winter for colds    . aspirin 81 MG EC tablet Take 81 mg by mouth daily.      . cholecalciferol (VITAMIN D) 1000 UNITS tablet Take 1,000 Units by mouth daily.    [name] Co-Enzyme Q-10 100 MG CAPS Take 1 capsule by mouth daily.    . fluconazole (DIFLUCAN) 150 MG tablet Take 150 mg by mouth. x2    . fluocinonide cream (LIDEX)  [id number] % Apply 1 application topically 2 (two) times daily.    . fluticasone (CUTIVATE) 0.05 % cream Apply 1 application topically 2 (two) times daily.    . fluticasone (FLONASE) 50 MCG/ACT nasal spray Place 2 sprays into both nostrils at bedtime. 16 g 1  . hydrochlorothiazide (HYDRODIURIL) 12.5 MG tablet Take 1 tablet (12.5 mg total) by mouth daily. 30 tablet 0  . ibuprofen (ADVIL,MOTRIN) 200 MG tablet Take 3 tablets (600 mg total) by mouth every 6 (six) hours as needed. For pain 30 tablet 0  . ketoconazole (NIZORAL) 2 % cream Apply 1 application topically daily.    . meloxicam (MOBIC) 15 MG tablet TAKE HALF TO ONE TABLET BY MOUTH EVERY DAY WITH FOOD FOR PAIN OR inflammation.  1  . MINIVELLE 0.05 MG/24HR patch APPLY 1 PATCH TO SKIN TWICE A WEEK  12  . Multiple Vitamin (MULTIVITAMIN) tablet Take 1 tablet by mouth daily.    . naratriptan (AMERGE) 2.5 MG tablet Take 1 tablet (2.5 mg total) by mouth as needed. Take one (1) tablet at onset of headache; if returns or does not resolve, may repeat after 4 hours; do not exceed five (5) mg in 24 hours. Use w/3 20mg  prednisone 10 tablet 3  . olmesartan (BENICAR) 40 MG tablet Take 1 tablet daily for BP 90 tablet 1  . Omega-3 Fatty Acids (FISH OIL) 1200 MG CAPS Take 1 capsule by mouth daily.    . valACYclovir (VALTREX) 500 MG tablet Take 500 mg by mouth daily as needed. For cold sores    . atenolol (TENORMIN) 50 MG tablet 1/2-1 pill at night for blood pressure 30 tablet 11   No current facility-administered medications on file prior to visit.    Health Maintenance:   Immunization History  Administered Date(s) Administered  . Influenza-Unspecified 06/22/2013  . Tdap 09/22/2006   Tetanus: 2008 Pneumovax: N/A Prevnar 13: N/A Flu vaccine: 2017 at work Zostavax: N/A  LMP: 2013, TAH Pap: April 2016 Dr. Benjie Karvonen MGM: 10/2015 , CAT C DEXA: N/A Colonoscopy: 2011, due 10 years EGD: N/A Echo 2011  Last Dental Exam: Dr. Gray Sink Last Eye Exam: Dr. Herbert Deaner, wears  glasses, has been 2 years Patient Care Team: Unk Pinto, MD as PCP - General (Internal Medicine) Elsie Saas, MD as Consulting Physician (Orthopedic Surgery) Monna Fam, MD as Consulting Physician (Ophthalmology) Fay Records, MD as Consulting Physician (Cardiology) Inda Castle, MD as Consulting Physician (Gastroenterology) Azucena Fallen, MD as Consulting Physician (Obstetrics and Gynecology) Leeroy Cha, MD as Consulting Physician (Neurosurgery) Druscilla Brownie, MD as Consulting Physician (Dermatology)  Allergies:  Allergies  Allergen Reactions  . Penicillins     REACTION: hives   Medical History:  Past Medical History:  Diagnosis Date  . Anxiety   . 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 HSV-1 infection   . HTN (hypertension)   . Hyperlipidemia   . Migraine   . PONV (postoperative nausea and vomiting)    Surgical History:  Past Surgical History:  Procedure Laterality Date  . acl replacement    .  BILATERAL SALPINGECTOMY  09/10/2012   Procedure: BILATERAL SALPINGECTOMY;  Surgeon: Elveria Royals, MD;  Location: [facility];  Service: Gynecology;  Laterality: Bilateral;  . Ramer SURGERY  2012  . ELBOW SURGERY Right 01/2014  . ROBOTIC ASSISTED TOTAL HYSTERECTOMY  09/10/2012   Procedure: ROBOTIC ASSISTED TOTAL HYSTERECTOMY;  Surgeon: Elveria Royals, MD;  Location: [facility];  Service: Gynecology;  Laterality: N/A;  3 hrs.  . TUBAL LIGATION     Family History:  Family History  Problem Relation Age of Onset  . Alzheimer's disease Father   . Hypertension Mother   . Stroke Mother   . Cancer Mother   . Hyperlipidemia Brother   . Heart disease Brother   . Diabetes Brother   . Hypertension Brother    Social History:  Social History  Substance Use Topics  . Smoking status: Passive Smoke Exposure - Never Smoker  . Smokeless tobacco: Never Used     Comment: tobacco use - no   . Alcohol use 0.0 oz/week     Comment: rare     Review of Systems: Review of Systems  Constitutional: Negative.   HENT: Negative for congestion, ear discharge, ear pain, hearing loss, nosebleeds, sore throat and tinnitus.   Eyes: Negative.   Respiratory: Negative.  Negative for stridor.   Cardiovascular: Negative.   Gastrointestinal: Negative.   Genitourinary: Negative.   Musculoskeletal: Positive for joint pain, myalgias and neck pain. Negative for back pain and falls.  Skin: Negative.   Neurological: Positive for headaches. Negative for dizziness, tingling, tremors, sensory change, speech change, focal weakness, seizures and loss of consciousness.  Endo/Heme/Allergies: Negative.   Psychiatric/Behavioral: Negative.     Physical Exam: Estimated body mass index is 29.92 kg/m as calculated from the following:   Height as of this encounter: 5' 6.5" (1.689 m).   Weight as of this encounter: 188 lb 3.2 oz (85.4 kg). BP 140/90   Pulse 72   Temp 97.2 F (36.2 C)   Resp 14   Ht 5' 6.5" (1.689 m)   Wt 188 lb 3.2 oz (85.4 kg)   LMP 09/05/2012   SpO2 98%   BMI 29.92 kg/m   Wt Readings from Last 3 Encounters:  07/25/16 188 lb 3.2 oz (85.4 kg)  03/03/16 184 lb (83.5 kg)  08/29/15 180 lb (81.6 kg)   General Appearance: Well nourished, in no apparent distress.  Eyes: PERRLA, EOMs, conjunctiva no swelling or erythema, normal fundi and vessels.  Sinuses: No Frontal/maxillary tenderness  ENT/Mouth: Ext aud canals clear, normal light reflex with TMs without erythema, bulging. Good dentition. No erythema, swelling, or exudate on post pharynx. Tonsils not swollen or erythematous. Hearing normal.  Neck: Supple, thyroid normal. No bruits  Respiratory: Respiratory effort normal, BS equal bilaterally without rales, rhonchi, wheezing or stridor.  Cardio: RRR without murmurs, rubs or gallops. Brisk peripheral pulses without edema.  Chest: symmetric, with normal excursions and percussion.  Breasts: defer Abdomen: Soft, nontender, no  guarding, rebound, hernias, masses, or organomegaly.  Lymphatics: Non tender without lymphadenopathy.  Genitourinary: defer Musculoskeletal: Full ROM all peripheral extremities,5/5 strength, and normal gait. Left shoulder with pain at bicipital tendon proximally with pain with abduction to 90 degrees and internal rotation. She has chronic neck pain, very pinpoint tenderness along traps.  Skin: Warm, dry without rashes, lesions, ecchymosis. Neuro: Cranial nerves intact, reflexes equal bilaterally. Normal muscle tone, no cerebellar symptoms. Sensation intact.  Psych: Awake and oriented X 3, normal affect, Insight and Judgment appropriate.  EKG: WNL no ST changes. AORTA SCAN: defer  [name] 9:50 AM [facility]

## 2016-07-26 LAB — BASIC METABOLIC PANEL WITH GFR
BUN: 12 mg/dL (ref 7–25)
CO2: 25 mmol/L (ref 20–31)
Calcium: 9.3 mg/dL (ref 8.6–10.4)
Chloride: 102 mmol/L (ref 98–110)
Creat: 0.74 mg/dL (ref 0.50–1.05)
GFR, Est African American: >89 mL/min (ref >=60)
GFR, Est Non African American: >89 mL/min (ref >=60)
Glucose, Bld: 96 mg/dL (ref 65–99)
Potassium: 4.2 mmol/L (ref 3.5–5.3)
Sodium: 138 mmol/L (ref 135–146)

## 2016-07-26 LAB — URINALYSIS, ROUTINE W REFLEX MICROSCOPIC
Bilirubin Urine: NEGATIVE
Glucose, UA: NEGATIVE
Hgb urine dipstick: NEGATIVE
Ketones, ur: NEGATIVE
Leukocytes, UA: NEGATIVE
Nitrite: NEGATIVE
Protein, ur: NEGATIVE
Specific Gravity, Urine: 1.007 (ref 1.001–1.035)
pH: 6.5 (ref 5.0–8.0)

## 2016-07-26 LAB — LIPID PANEL
Cholesterol: 181 mg/dL (ref 125–200)
HDL: 37 mg/dL — ABNORMAL LOW (ref >=46)
LDL Cholesterol: 104 mg/dL (ref <130)
Total CHOL/HDL Ratio: 4.9 Ratio (ref <=5.0)
Triglycerides: 202 mg/dL — ABNORMAL HIGH (ref <150)
VLDL: 40 mg/dL — ABNORMAL HIGH (ref <30)

## 2016-07-26 LAB — IRON AND TIBC
%SAT: 35 % (ref 11–50)
Iron: 116 ug/dL (ref 45–160)
TIBC: 328 ug/dL (ref 250–450)
UIBC: 212 ug/dL (ref 125–400)

## 2016-07-26 LAB — MICROALBUMIN / CREATININE URINE RATIO
Creatinine, Urine: 35 mg/dL (ref 20–320)
Microalb Creat Ratio: 11 mcg/mg creat (ref <30)
Microalb, Ur: 0.4 mg/dL

## 2016-07-26 LAB — MAGNESIUM: Magnesium: 1.9 mg/dL (ref 1.5–2.5)

## 2016-07-26 LAB — HEPATIC FUNCTION PANEL
ALT: 27 U/L (ref 6–29)
AST: 21 U/L (ref 10–35)
Albumin: 4.1 g/dL (ref 3.6–5.1)
Alkaline Phosphatase: 70 U/L (ref 33–130)
Bilirubin, Direct: 0.1 mg/dL (ref <=0.2)
Indirect Bilirubin: 0.4 mg/dL (ref 0.2–1.2)
Total Bilirubin: 0.5 mg/dL (ref 0.2–1.2)
Total Protein: 6.7 g/dL (ref 6.1–8.1)

## 2016-07-26 LAB — VITAMIN D 25 HYDROXY (VIT D DEFICIENCY, FRACTURES): Vit D, 25-Hydroxy: 38 ng/mL (ref 30–100)

## 2016-08-06 ENCOUNTER — Other Ambulatory Visit: Payer: Self-pay | Admitting: Physician Assistant

## 2016-08-06 DIAGNOSIS — I10 Essential (primary) hypertension: Secondary | ICD-10-CM

## 2016-08-06 DIAGNOSIS — G43809 Other migraine, not intractable, without status migrainosus: Secondary | ICD-10-CM

## 2016-08-28 ENCOUNTER — Other Ambulatory Visit: Payer: Self-pay | Admitting: Physician Assistant

## 2016-09-01 ENCOUNTER — Encounter: Payer: Self-pay | Admitting: Physician Assistant

## 2016-09-11 ENCOUNTER — Telehealth: Payer: Self-pay | Admitting: Internal Medicine

## 2016-09-11 ENCOUNTER — Emergency Department (HOSPITAL_COMMUNITY)
Admission: EM | Admit: 2016-09-11 | Discharge: 2016-09-11 | Disposition: A | Discharge status: Home / Self Care | Payer: BLUE CROSS/BLUE SHIELD | Attending: Emergency Medicine | Admitting: Emergency Medicine

## 2016-09-11 ENCOUNTER — Emergency Department (HOSPITAL_COMMUNITY): Payer: BLUE CROSS/BLUE SHIELD

## 2016-09-11 ENCOUNTER — Encounter (HOSPITAL_COMMUNITY): Payer: Self-pay

## 2016-09-11 DIAGNOSIS — Z7982 Long term (current) use of aspirin: Secondary | ICD-10-CM | POA: Diagnosis not present

## 2016-09-11 DIAGNOSIS — R0789 Other chest pain: Secondary | ICD-10-CM

## 2016-09-11 DIAGNOSIS — Z7722 Contact with and (suspected) exposure to environmental tobacco smoke (acute) (chronic): Secondary | ICD-10-CM | POA: Insufficient documentation

## 2016-09-11 DIAGNOSIS — Z79899 Other long term (current) drug therapy: Secondary | ICD-10-CM | POA: Diagnosis not present

## 2016-09-11 DIAGNOSIS — I10 Essential (primary) hypertension: Secondary | ICD-10-CM | POA: Diagnosis not present

## 2016-09-11 LAB — BASIC METABOLIC PANEL
Anion gap: 7 (ref 5–15)
BUN: 11 mg/dL (ref 6–20)
CO2: 28 mmol/L (ref 22–32)
Calcium: 9.5 mg/dL (ref 8.9–10.3)
Chloride: 103 mmol/L (ref 101–111)
Creatinine, Ser: 0.76 mg/dL (ref 0.44–1.00)
GFR calc Af Amer: >60 mL/min (ref >60)
GFR calc non Af Amer: >60 mL/min (ref >60)
Glucose, Bld: 103 mg/dL — ABNORMAL HIGH (ref 65–99)
Potassium: 3.7 mmol/L (ref 3.5–5.1)
Sodium: 138 mmol/L (ref 135–145)

## 2016-09-11 LAB — CBC
HCT: 40.6 % (ref 36.0–46.0)
Hemoglobin: 13.6 g/dL (ref 12.0–15.0)
MCH: 30.7 pg (ref 26.0–34.0)
MCHC: 33.5 g/dL (ref 30.0–36.0)
MCV: 91.6 fL (ref 78.0–100.0)
Platelets: 250 10*3/uL (ref 150–400)
RBC: 4.43 MIL/uL (ref 3.87–5.11)
RDW: 12.7 % (ref 11.5–15.5)
WBC: 8.6 10*3/uL (ref 4.0–10.5)

## 2016-09-11 LAB — I-STAT TROPONIN, ED
Troponin i, poc: 0 ng/mL (ref 0.00–0.08)
Troponin i, poc: 0 ng/mL (ref 0.00–0.08)

## 2016-09-11 NOTE — Discharge Instructions (Signed)
There does not appear to be an emergent cause for your symptoms at this time. Your exam, labs, chest x-ray and EKG are all reassuring. Please follow-up with your doctor in the next week for reevaluation. Return to ED for any new or worsening symptoms as we discussed.

## 2016-09-11 NOTE — Telephone Encounter (Signed)
Patient called and stated she is having Pressure and fluttering in chest for 2wks,  [name] advised, go to the ED immediately.

## 2016-09-11 NOTE — ED Provider Notes (Signed)
[facility] DEPT Provider Note   CSN: [id number] Arrival date & time: 09/11/16  1554     History   Chief Complaint Chief Complaint  Patient presents with  . Chest Pain    HPI Natasha Fritz is a 55 y.o. female.  HPI here for evaluation of chest discomfort that has been ongoing over the past 3 weeks. She reports constant discomfort at least over the past 2 days. Difficult to characterize.  Reports intermittent shortness of breath "like bronchitis". She also feels "muscular pain" that will migrate across the front of her chest. She denies any nausea, vomiting, diaphoresis, numbness or weakness. She has a history of high blood pressure and hyperlipidemia. Nothing tried to improved symptoms and nothing makes the problem better or worse. She denies any exertional component. Denies any discomfort now.  Past Medical History:  Diagnosis Date  . Anxiety   . 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 HSV-1 infection   . HTN (hypertension)   . Hyperlipidemia   . Migraine   . PONV (postoperative nausea and vomiting)     Patient Active Problem List   Diagnosis Date Noted  . Prediabetes 05/23/2015  . Medication management 05/23/2015  . Vitamin D deficiency 05/23/2015  . Basal cell carcinoma of nose 05/23/2015  . DDD (degenerative disc disease), cervical 05/23/2015  . 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 Essential hypertension 02/01/2010  . Hyperlipidemia 01/31/2010  . ABNORMAL EKG 01/31/2010    Past Surgical History:  Procedure Laterality Date  . acl replacement    . BILATERAL SALPINGECTOMY  09/10/2012   Procedure: BILATERAL SALPINGECTOMY;  Surgeon: Elveria Royals, MD;  Location: [facility];  Service: Gynecology;  Laterality: Bilateral;  . [place] SURGERY  2012  . ELBOW SURGERY Right 01/2014  . ROBOTIC ASSISTED TOTAL HYSTERECTOMY  09/10/2012   Procedure: ROBOTIC ASSISTED TOTAL HYSTERECTOMY;  Surgeon: Elveria Royals, MD;  Location:  [facility];  Service: Gynecology;  Laterality: N/A;  3 hrs.  . TUBAL LIGATION      OB History    No data available       Home Medications    Prior to Admission medications   Medication Sig Start Date End Date Taking? Authorizing Provider  ALPRAZolam Duanne Moron) 0.5 MG tablet Take 1 tablet (0.5 mg total) by mouth 2 (two) times daily as needed for anxiety. 03/30/15   [name], PA-C  Ascorbic Acid (VITAMIN C) 1000 MG tablet Take 1,000 mg by mouth as needed. In winter for colds    Historical Provider, MD  aspirin 81 MG EC tablet Take 81 mg by mouth daily.      Historical Provider, MD  atenolol (TENORMIN) 50 MG tablet TAKE HALF TO ONE TABLET BY MOUTH NIGHTLY FOR BLOOD PRESSURE 08/06/16   Unk Pinto, MD  cholecalciferol (VITAMIN D) 1000 UNITS tablet Take 1,000 Units by mouth daily.    Historical Provider, MD  Co-Enzyme Q-10 100 MG CAPS Take 1 capsule by mouth daily.    Historical Provider, MD  fluconazole (DIFLUCAN) 150 MG tablet Take 150 mg by mouth. x2    Historical Provider, MD  fluocinonide cream (LIDEX) AB-123456789 % Apply 1 application topically 2 (two) times daily.    Historical Provider, MD  fluticasone (CUTIVATE) 0.05 % cream Apply 1 application topically 2 (two) times daily.    Historical Provider, MD  fluticasone (FLONASE) 50 MCG/ACT nasal spray Place 2 sprays into both nostrils at bedtime. 08/16/14   Jennifer Couillard, PA-C  hydrochlorothiazide (HYDRODIURIL) 12.5  MG tablet Take 1 tablet (12.5 mg total) by mouth daily. 10/27/14   Billy Fischer, MD  ibuprofen (ADVIL,MOTRIN) 200 MG tablet Take 3 tablets (600 mg total) by mouth every 6 (six) hours as needed. For pain 09/11/12   Azucena Fallen, MD  ketoconazole (NIZORAL) 2 % cream Apply 1 application topically daily.    Historical Provider, MD  meloxicam (MOBIC) 15 MG tablet TAKE HALF TO ONE TABLET BY MOUTH EVERY DAY WITH FOOD FOR PAIN OR inflammation. 02/02/16   Historical Provider, MD  meloxicam (MOBIC) 15 MG tablet TAKE HALF TO ONE TABLET BY  MOUTH EVERY DAY WITH FOOD FOR PAIN OR inflammation. 08/28/16   [name], PA-C  MINIVELLE 0.05 MG/24HR patch APPLY 1 PATCH TO SKIN TWICE A WEEK 02/11/16   Historical Provider, MD  Multiple Vitamin (MULTIVITAMIN) tablet Take 1 tablet by mouth daily.    Historical Provider, MD  naratriptan (AMERGE) 2.5 MG tablet Take 1 tablet (2.5 mg total) by mouth as needed. Do not take more then 5mg s in 24 hrs. Use w/3 20 mg prednisone 07/25/16   [name], PA-C  olmesartan [facility]) 40 MG tablet Take 1 tablet daily for BP 06/23/16 12/24/16  Unk Pinto, MD  Omega-3 Fatty Acids (FISH OIL) 1200 MG CAPS Take 1 capsule by mouth daily.    Historical Provider, MD  predniSONE (DELTASONE) 20 MG tablet 2 tablets daily for 3 days, 1 tablet daily for 4 days. 07/25/16   [name], PA-C  valACYclovir (VALTREX) 500 MG tablet Take 500 mg by mouth daily as needed. For cold sores    Historical Provider, MD    Family History Family History  Problem Relation Age of Onset  . Alzheimer's disease Father   . Hypertension Mother   . Stroke Mother   . Cancer Mother   . Hyperlipidemia Brother   . Heart disease Brother   . Diabetes Brother   . Hypertension Brother     Social History Social History  Substance Use Topics  . Smoking status: Passive Smoke Exposure - Never Smoker  . Smokeless tobacco: Never Used     Comment: tobacco use - no   . Alcohol use 0.0 oz/week     Comment: rare     Allergies   Penicillins   Review of Systems Review of Systems A complete review of systems was completed and was negative except for pertinent positives and negatives as mentioned in the history of present illness    Physical Exam Updated Vital Signs BP 157/81   Pulse (!) 53   Temp 97.6 F (36.4 C) (Oral)   Resp 15   LMP 09/05/2012   SpO2 99%   Physical Exam  Constitutional: She appears well-developed. No distress.  Awake, alert and nontoxic in appearance  HENT:  Head: Normocephalic and atraumatic.  Right  Ear: External ear normal.  Left Ear: External ear normal.  Mouth/Throat: Oropharynx is clear and moist.  Eyes: Conjunctivae and EOM are normal. Pupils are equal, round, and reactive to light.  Neck: Normal range of motion. No JVD present.  Cardiovascular: Normal rate, regular rhythm and normal heart sounds.   Pulmonary/Chest: Effort normal and breath sounds normal. No stridor.  Abdominal: Soft. There is no tenderness.  Musculoskeletal: Normal range of motion. She exhibits no edema.  Neurological:  Awake, alert, cooperative and aware of situation; motor strength bilaterally; sensation normal to light touch bilaterally; no facial asymmetry; tongue midline; major cranial nerves appear intact;  baseline gait without new ataxia.  Skin: No  rash noted. She is not diaphoretic.  Psychiatric: She has a normal mood and affect. Her behavior is normal. Thought content normal.  Nursing note and vitals reviewed.    ED Treatments / Results  Labs (all labs ordered are listed, but only abnormal results are displayed) Labs Reviewed  BASIC METABOLIC PANEL - Abnormal; Notable for the following:       Result Value   Glucose, Bld 103 (*)    All other components within normal limits  CBC  I-STAT TROPOININ, ED  I-STAT TROPOININ, ED    EKG  EKG Interpretation  Date/Time:  Thursday September 11 2016 17:49:20 EST Ventricular Rate:  62 PR Interval:  194 QRS Duration: 74 QT Interval:  426 QTC Calculation: 432 R Axis:   80 Text Interpretation:  Normal sinus rhythm Anteroseptal infarct , age undetermined Abnormal ECG Confirmed by Lita Mains  MD, DAVID ([id number]) on 09/11/2016 8:12:11 PM       Radiology Dg Chest 2 View  Result Date: 09/11/2016 CLINICAL DATA:  Chest pain. Hypertension. Chest pressure intermittently for 3 weeks. EXAM: CHEST  2 VIEW COMPARISON:  02/19/2011 FINDINGS: Mild-to-moderate enlargement of the cardiopericardial silhouette without edema. New lower cervical plate and screw fixator.  Mildly tortuous thoracic aorta. The lungs appear clear.  No pleural effusion. IMPRESSION: 1. No act acute ive cardiopulmonary disease is radiographically apparent. 2. Stable mild to moderate enlargement of the cardiopericardial silhouette. Electronically Signed   By: Van Clines M.D.   On: 09/11/2016 16:29    Procedures Procedures (including critical care time)  Medications Ordered in ED Medications - No data to display   Initial Impression / Assessment and Plan / ED Course  I have reviewed the triage vital signs and the nursing notes.  Pertinent labs & imaging results that were available during my care of the patient were reviewed by me and considered in my medical decision making (see chart for details).  Clinical Course    Vitals:   09/11/16 1844 09/11/16 1845 09/11/16 1900 09/11/16 1930  BP: (!) 161/103 (!) 161/103 174/99 157/81  Pulse: (!) 56 (!) 51 (!) 58 (!) 53  Resp: 16   15  Temp:      TempSrc:      SpO2: 98% 99% 97% 99%     Clinical picture not overly concerning for ACS or PE. She remains hemodynamically stable and afebrile. Labs, EKG and chest x-ray are all reassuring. Plan for delta troponin. Anticipate discharge home to follow up with PCP or her cardiologist for outpatient follow-up and blood pressure recheck. They verbalized understanding, agreed with plan and subsequent discharge.  Final Clinical Impressions(s) / ED Diagnoses   Final diagnoses:  Atypical chest pain    New Prescriptions New Prescriptions   No medications on file     Comer Locket, PA-C 09/11/16 2014    Julianne Rice, MD 09/15/16 [phone]

## 2016-09-11 NOTE — ED Triage Notes (Addendum)
Pt reports intermittent chest pressure x3 weeks. Pt states this may be due increased stress. Pt reports some radiation into her jaw. Pt denies nausea or diaphoresis.

## 2016-09-20 ENCOUNTER — Other Ambulatory Visit: Payer: Self-pay | Admitting: Internal Medicine

## 2016-09-20 DIAGNOSIS — I10 Essential (primary) hypertension: Secondary | ICD-10-CM

## 2016-09-20 DIAGNOSIS — G43809 Other migraine, not intractable, without status migrainosus: Secondary | ICD-10-CM

## 2016-09-23 ENCOUNTER — Ambulatory Visit (INDEPENDENT_AMBULATORY_CARE_PROVIDER_SITE_OTHER): Payer: 59 | Admitting: Physician Assistant

## 2016-09-23 ENCOUNTER — Encounter: Payer: Self-pay | Admitting: Physician Assistant

## 2016-09-23 ENCOUNTER — Other Ambulatory Visit: Payer: Self-pay

## 2016-09-23 VITALS — BP 136/80 | HR 62 | Temp 97.3°F | Resp 16 | Ht 66.5 in | Wt 190.0 lb

## 2016-09-23 DIAGNOSIS — R0789 Other chest pain: Secondary | ICD-10-CM

## 2016-09-23 DIAGNOSIS — I10 Essential (primary) hypertension: Secondary | ICD-10-CM

## 2016-09-23 DIAGNOSIS — G43809 Other migraine, not intractable, without status migrainosus: Secondary | ICD-10-CM

## 2016-09-23 MED ORDER — OLMESARTAN MEDOXOMIL 40 MG PO TABS: ORAL_TABLET | ORAL | 1 refills | Status: DC

## 2016-09-23 MED ORDER — ATENOLOL 50 MG PO TABS: ORAL_TABLET | ORAL | 1 refills | Status: DC

## 2016-09-23 NOTE — Patient Instructions (Addendum)
Silent reflux- look it up and symptoms  Laryngopharyngeal reflux     Food Choices for Gastroesophageal Reflux Disease, Adult When you have gastroesophageal reflux disease (GERD), the foods you eat and your eating habits are very important. Choosing the right foods can help ease your discomfort. What guidelines do I need to follow?  Choose fruits, vegetables, whole grains, and low-fat dairy products.  Choose low-fat meat, fish, and poultry.  Limit fats such as oils, salad dressings, butter, nuts, and avocado.  Keep a food diary. This helps you identify foods that cause symptoms.  Avoid foods that cause symptoms. These may be different for everyone.  Eat small meals often instead of 3 large meals a day.  Eat your meals slowly, in a place where you are relaxed.  Limit fried foods.  Cook foods using methods other than frying.  Avoid drinking alcohol.  Avoid drinking large amounts of liquids with your meals.  Avoid bending over or lying down until 2-3 hours after eating. What foods are not recommended? These are some foods and drinks that may make your symptoms worse: Vegetables  Tomatoes. Tomato juice. Tomato and spaghetti sauce. Chili peppers. Onion and garlic. Horseradish. Fruits  Oranges, grapefruit, and lemon (fruit and juice). Meats  High-fat meats, fish, and poultry. This includes hot dogs, ribs, ham, sausage, salami, and bacon. Dairy  Whole milk and chocolate milk. Sour cream. Cream. Butter. Ice cream. Cream cheese. Drinks  Coffee and tea. Bubbly (carbonated) drinks or energy drinks. Condiments  Hot sauce. Barbecue sauce. Sweets/Desserts  Chocolate and cocoa. Donuts. Peppermint and spearmint. Fats and Oils  High-fat foods. This includes [place] fries and potato chips. Other  Vinegar. Strong spices. This includes black pepper, white pepper, red pepper, cayenne, curry powder, cloves, ginger, and chili powder. The items listed above may not be a complete list of  foods and drinks to avoid. Contact your dietitian for more information.  This information is not intended to replace advice given to you by your health care provider. Make sure you discuss any questions you have with your health care provider. Document Released: 03/09/2012 Document Revised: 02/14/2016 Document Reviewed: 07/13/2013 Elsevier Interactive Patient Education  2017 [employer].

## 2016-09-23 NOTE — Progress Notes (Signed)
Assessment and Plan: Chest discomfort Very atypical, normal EKG/negative enzymes, likely stress versus GERD- dexilant samples given, decrease NSAIDS, declines antianxiety medication at this time, has follow up in Feb/March, will discuss again then. If any SOB, worsening CP, black stools, severe AB pain, vomiting, etc will go to ER or follow up in office.   HPI 56 y.o.female with history of chol,anxiety, HTN presents for follow up from the ER. Patient went to the ER on 09/11/16, for: atypical chest pain, she had normal CBC, BMP, troponin, EKG, CXR in the ER, thought to be muscular versus bronchitis and was told to follow up. She has history of normal echo 2011.  She states that her palpitations have improve with vacation. States last few months at work have been stressful. She has also has GERD symptoms, does take mobic for pain and has had prednisone recently. Denies dark black stool, blood in stools.   Dg Chest 2 View  Result Date: 09/11/2016 CLINICAL DATA:  Chest pain. Hypertension. Chest pressure intermittently for 3 weeks. EXAM: CHEST  2 VIEW COMPARISON:  02/19/2011 FINDINGS: Mild-to-moderate enlargement of the cardiopericardial silhouette without edema. New lower cervical plate and screw fixator. Mildly tortuous thoracic aorta. The lungs appear clear.  No pleural effusion. IMPRESSION: 1. No act acute ive cardiopulmonary disease is radiographically apparent. 2. Stable mild to moderate enlargement of the cardiopericardial silhouette. Electronically Signed   By: Van Clines M.D.   On: 09/11/2016 16:29    Past Medical History:  Diagnosis Date  . Anxiety   . 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 HSV-1 infection   . HTN (hypertension)   . Hyperlipidemia   . Migraine   . PONV (postoperative nausea and vomiting)      Allergies  Allergen Reactions  . Penicillins     REACTION: hives      Current Outpatient Prescriptions on File Prior to Visit  Medication Sig  Dispense Refill  . ALPRAZolam (XANAX) 0.5 MG tablet Take 1 tablet (0.5 mg total) by mouth 2 (two) times daily as needed for anxiety. 60 tablet 0  . Ascorbic Acid (VITAMIN C) 1000 MG tablet Take 1,000 mg by mouth as needed. In winter for colds    . aspirin 81 MG EC tablet Take 81 mg by mouth daily.      . cholecalciferol (VITAMIN D) 1000 UNITS tablet Take 1,000 Units by mouth daily.    [name] Co-Enzyme Q-10 100 MG CAPS Take 1 capsule by mouth daily.    . fluconazole (DIFLUCAN) 150 MG tablet Take 150 mg by mouth. x2    . fluocinonide cream (LIDEX) AB-123456789 % Apply 1 application topically 2 (two) times daily.    . fluticasone (CUTIVATE) 0.05 % cream Apply 1 application topically 2 (two) times daily.    . fluticasone (FLONASE) 50 MCG/ACT nasal spray Place 2 sprays into both nostrils at bedtime. 16 g 1  . hydrochlorothiazide (HYDRODIURIL) 12.5 MG tablet Take 1 tablet (12.5 mg total) by mouth daily. 30 tablet 0  . ibuprofen (ADVIL,MOTRIN) 200 MG tablet Take 3 tablets (600 mg total) by mouth every 6 (six) hours as needed. For pain 30 tablet 0  . ketoconazole (NIZORAL) 2 % cream Apply 1 application topically daily.    . meloxicam (MOBIC) 15 MG tablet TAKE HALF TO ONE TABLET BY MOUTH EVERY DAY WITH FOOD FOR PAIN OR inflammation.  1  . meloxicam (MOBIC) 15 MG tablet TAKE HALF TO ONE TABLET BY MOUTH EVERY DAY WITH FOOD FOR PAIN OR inflammation.  90 tablet 0  . MINIVELLE 0.05 MG/24HR patch APPLY 1 PATCH TO SKIN TWICE A WEEK  12  . Multiple Vitamin (MULTIVITAMIN) tablet Take 1 tablet by mouth daily.    . naratriptan (AMERGE) 2.5 MG tablet Take 1 tablet (2.5 mg total) by mouth as needed. Do not take more then 5mg s in 24 hrs. Use w/3 20 mg prednisone 10 tablet 3  . Omega-3 Fatty Acids (FISH OIL) 1200 MG CAPS Take 1 capsule by mouth daily.    . predniSONE (DELTASONE) 20 MG tablet 2 tablets daily for 3 days, 1 tablet daily for 4 days. 10 tablet 0  . valACYclovir (VALTREX) 500 MG tablet Take 500 mg by mouth daily as needed.  For cold sores     No current facility-administered medications on file prior to visit.     ROS: all negative except above.   Physical Exam: Filed Weights   09/23/16 1641  Weight: 190 lb (86.2 kg)   BP 136/80   Pulse 62   Temp 97.3 F (36.3 C)   Resp 16   Ht 5' 6.5" (1.689 m)   Wt 190 lb (86.2 kg)   LMP 09/05/2012   SpO2 96%   BMI 30.21 kg/m  General Appearance: Well nourished, in no apparent distress. Eyes: PERRLA, EOMs, conjunctiva no swelling or erythema Sinuses: No Frontal/maxillary tenderness ENT/Mouth: Ext aud canals clear, TMs without erythema, bulging. No erythema, swelling, or exudate on post pharynx.  Tonsils not swollen or erythematous. Hearing normal.  Neck: Supple, thyroid normal.  Respiratory: Respiratory effort normal, BS equal bilaterally without rales, rhonchi, wheezing or stridor.  Cardio: RRR with no MRGs. Brisk peripheral pulses without edema.  Abdomen: Soft, + BS.  Slight epicastric tenderness, no guarding, rebound, hernias, masses. Lymphatics: Non tender without lymphadenopathy.  Musculoskeletal: Full ROM, 5/5 strength, normal gait.  Skin: Warm, dry without rashes, lesions, ecchymosis.  Neuro: Cranial nerves intact. Normal muscle tone, no cerebellar symptoms. Sensation intact.  Psych: Awake and oriented X 3, normal affect, Insight and Judgment appropriate.     [name], PA-C 5:20 PM [facility]

## 2016-09-24 MED FILL — ATENOLOL 25 MG TABLET: 25 | 30 days supply | Qty: 60 | Fill #0

## 2016-09-24 MED FILL — OLMESARTAN MEDOXOMIL 40 MG: 40 | 90 days supply | Qty: 90 | Fill #0

## 2016-09-25 MED FILL — ESTRADIOL 0.075 MG PATCH: 0.075 | 28 days supply | Qty: 8 | Fill #0

## 2016-10-30 ENCOUNTER — Other Ambulatory Visit: Payer: Self-pay | Admitting: Physician Assistant

## 2016-10-30 ENCOUNTER — Encounter: Payer: Self-pay | Admitting: Physician Assistant

## 2016-10-30 MED ORDER — AZITHROMYCIN 250 MG PO TABS: ORAL_TABLET | ORAL | 1 refills | Status: AC

## 2016-10-30 MED ORDER — PREDNISONE 20 MG PO TABS: ORAL_TABLET | ORAL | 0 refills | Status: DC

## 2016-10-30 MED FILL — predniSONE 20 MG TABS: 20 | 7 days supply | Qty: 10 | Fill #0

## 2016-10-30 MED FILL — ESTRADIOL 0.075 MG PATCH: 0.075 | 28 days supply | Qty: 8 | Fill #0

## 2016-10-30 MED FILL — AZITHROMYCIN 250 MG TABLET: 250 | 5 days supply | Qty: 6 | Fill #0

## 2016-10-31 DIAGNOSIS — H04123 Dry eye syndrome of bilateral lacrimal glands: Secondary | ICD-10-CM | POA: Diagnosis not present

## 2016-10-31 DIAGNOSIS — H40013 Open angle with borderline findings, low risk, bilateral: Secondary | ICD-10-CM | POA: Diagnosis not present

## 2016-11-05 ENCOUNTER — Ambulatory Visit: Payer: Self-pay | Admitting: Physician Assistant

## 2016-11-10 ENCOUNTER — Ambulatory Visit (INDEPENDENT_AMBULATORY_CARE_PROVIDER_SITE_OTHER): Payer: 59 | Admitting: Physician Assistant

## 2016-11-10 VITALS — BP 128/66 | HR 66 | Temp 97.2°F | Resp 14 | Ht 66.5 in | Wt 189.0 lb

## 2016-11-10 DIAGNOSIS — H00015 Hordeolum externum left lower eyelid: Secondary | ICD-10-CM | POA: Diagnosis not present

## 2016-11-10 DIAGNOSIS — E559 Vitamin D deficiency, unspecified: Secondary | ICD-10-CM | POA: Diagnosis not present

## 2016-11-10 DIAGNOSIS — E785 Hyperlipidemia, unspecified: Secondary | ICD-10-CM

## 2016-11-10 DIAGNOSIS — Z79899 Other long term (current) drug therapy: Secondary | ICD-10-CM | POA: Diagnosis not present

## 2016-11-10 DIAGNOSIS — I10 Essential (primary) hypertension: Secondary | ICD-10-CM

## 2016-11-10 DIAGNOSIS — R7303 Prediabetes: Secondary | ICD-10-CM | POA: Diagnosis not present

## 2016-11-10 LAB — LIPID PANEL
Cholesterol: 211 mg/dL — ABNORMAL HIGH (ref <200)
HDL: 33 mg/dL — ABNORMAL LOW (ref >50)
LDL Cholesterol: 132 mg/dL — ABNORMAL HIGH (ref <100)
Total CHOL/HDL Ratio: 6.4 Ratio — ABNORMAL HIGH (ref <5.0)
Triglycerides: 229 mg/dL — ABNORMAL HIGH (ref <150)
VLDL: 46 mg/dL — ABNORMAL HIGH (ref <30)

## 2016-11-10 LAB — CBC WITH DIFFERENTIAL/PLATELET
Basophils Absolute: 80 cells/uL (ref 0–200)
Basophils Relative: 1 %
Eosinophils Absolute: 160 cells/uL (ref 15–500)
Eosinophils Relative: 2 %
HCT: 38.3 % (ref 35.0–45.0)
Hemoglobin: 12.9 g/dL (ref 11.7–15.5)
Lymphocytes Relative: 24 %
Lymphs Abs: 1920 cells/uL (ref 850–3900)
MCH: 30.4 pg (ref 27.0–33.0)
MCHC: 33.7 g/dL (ref 32.0–36.0)
MCV: 90.1 fL (ref 80.0–100.0)
MPV: 10.8 fL (ref 7.5–12.5)
Monocytes Absolute: 560 cells/uL (ref 200–950)
Monocytes Relative: 7 %
Neutro Abs: 5280 cells/uL (ref 1500–7800)
Neutrophils Relative %: 66 %
Platelets: 254 10*3/uL (ref 140–400)
RBC: 4.25 MIL/uL (ref 3.80–5.10)
RDW: 13.3 % (ref 11.0–15.0)
WBC: 8 10*3/uL (ref 3.8–10.8)

## 2016-11-10 LAB — BASIC METABOLIC PANEL WITH GFR
BUN: 18 mg/dL (ref 7–25)
CO2: 28 mmol/L (ref 20–31)
Calcium: 9.2 mg/dL (ref 8.6–10.4)
Chloride: 105 mmol/L (ref 98–110)
Creat: 0.77 mg/dL (ref 0.50–1.05)
GFR, Est African American: >89 mL/min (ref >=60)
GFR, Est Non African American: 87 mL/min (ref >=60)
Glucose, Bld: 102 mg/dL — ABNORMAL HIGH (ref 65–99)
Potassium: 4 mmol/L (ref 3.5–5.3)
Sodium: 141 mmol/L (ref 135–146)

## 2016-11-10 LAB — HEPATIC FUNCTION PANEL
ALT: 17 U/L (ref 6–29)
AST: 18 U/L (ref 10–35)
Albumin: 4.1 g/dL (ref 3.6–5.1)
Alkaline Phosphatase: 64 U/L (ref 33–130)
Bilirubin, Direct: 0.1 mg/dL (ref <=0.2)
Indirect Bilirubin: 0.2 mg/dL (ref 0.2–1.2)
Total Bilirubin: 0.3 mg/dL (ref 0.2–1.2)
Total Protein: 6.6 g/dL (ref 6.1–8.1)

## 2016-11-10 LAB — TSH: TSH: 1.64 mIU/L

## 2016-11-10 MED ORDER — NEOMYCIN-POLYMYXIN-DEXAMETH 3.5-10000-0.1 OP SUSP: 1.0000 [drp] | Freq: Four times a day (QID) | OPHTHALMIC | 1 refills | Status: DC

## 2016-11-10 MED FILL — NEO/POLYMYXIN/DEXAMETH DROP: 3.5-10000-0 | 25 days supply | Qty: 5 | Fill #0

## 2016-11-10 NOTE — Progress Notes (Signed)
Assessment and Plan:  Hypertension: Continue medication, monitor blood pressure at home. Continue DASH diet.  Reminder to go to the ER if any CP, SOB, nausea, dizziness, severe HA, changes vision/speech, left arm numbness and tingling, and jaw pain. Cholesterol: Continue diet and exercise. Check cholesterol.  Pre-diabetes-Continue diet and exercise. Check A1C Vitamin D Def- check level and continue medications.  Stye- continue warm compresses 3 x a day, maxitrol sent in, if not better eye doctor   Continue diet and meds as discussed. Further disposition pending results of labs. Future Appointments Date Time Provider [place]  08/10/2017 9:00 AM [name], PA-C [id number] None    HPI 56 y.o. female  presents for 3 month follow up with hypertension, hyperlipidemia, prediabetes and vitamin D. Her blood pressure has been controlled at home, last visit atenolol was added for BP and migraine prevention, today her BP is BP: 128/66  She was having atypical chest discomfort, states that team member at work left and she is feeling better, had normal work up in the ER 12/21.  She does workout, walking some everyday.  She denies chest pain, shortness of breath, dizziness.  She is not on cholesterol medication. Her cholesterol is at goal. The cholesterol last visit was:   Lab Results  Component Value Date   CHOL 181 07/25/2016   HDL 37 (L) 07/25/2016   LDLCALC 104 07/25/2016   TRIG 202 (H) 07/25/2016   CHOLHDL 4.9 07/25/2016   She has been working on diet and exercise for prediabetes, and denies paresthesia of the feet, polydipsia, polyuria and visual disturbances. Last A1C in the office was:  Lab Results  Component Value Date   HGBA1C 5.5 07/25/2016   Patient is on Vitamin D supplement, on 2000   Lab Results  Component Value Date   VD25OH 38 07/25/2016   Migraines have improved with atenolol, takes 1/2 at night.  BMI is Body mass index is 30.05 kg/m., she is working on diet  and exercise. Wt Readings from Last 3 Encounters:  11/10/16 189 lb (85.7 kg)  09/23/16 190 lb (86.2 kg)  07/25/16 188 lb 3.2 oz (85.4 kg)    Current Medications:  Current Outpatient Prescriptions on File Prior to Visit  Medication Sig Dispense Refill  . ALPRAZolam (XANAX) 0.5 MG tablet Take 1 tablet (0.5 mg total) by mouth 2 (two) times daily as needed for anxiety. 60 tablet 0  . Ascorbic Acid (VITAMIN C) 1000 MG tablet Take 1,000 mg by mouth as needed. In winter for colds    . aspirin 81 MG EC tablet Take 81 mg by mouth daily.      [name] atenolol (TENORMIN) 50 MG tablet TAKE HALF TO ONE TABLET BY MOUTH NIGHTLY FOR BLOOD PRESSURE 30 tablet 1  . cholecalciferol (VITAMIN D) 1000 UNITS tablet Take 1,000 Units by mouth daily.    [name] Co-Enzyme Q-10 100 MG CAPS Take 1 capsule by mouth daily.    . fluocinonide cream (LIDEX) AB-123456789 % Apply 1 application topically 2 (two) times daily.    . fluticasone (CUTIVATE) 0.05 % cream Apply 1 application topically 2 (two) times daily.    . fluticasone (FLONASE) 50 MCG/ACT nasal spray Place 2 sprays into both nostrils at bedtime. 16 g 1  . hydrochlorothiazide (HYDRODIURIL) 12.5 MG tablet Take 1 tablet (12.5 mg total) by mouth daily. 30 tablet 0  . ibuprofen (ADVIL,MOTRIN) 200 MG tablet Take 3 tablets (600 mg total) by mouth every 6 (six) hours as needed. For pain 30 tablet  0  . ketoconazole (NIZORAL) 2 % cream Apply 1 application topically daily.    . meloxicam (MOBIC) 15 MG tablet TAKE HALF TO ONE TABLET BY MOUTH EVERY DAY WITH FOOD FOR PAIN OR inflammation.  1  . MINIVELLE 0.05 MG/24HR patch APPLY 1 PATCH TO SKIN TWICE A WEEK  12  . Multiple Vitamin (MULTIVITAMIN) tablet Take 1 tablet by mouth daily.    . naratriptan (AMERGE) 2.5 MG tablet Take 1 tablet (2.5 mg total) by mouth as needed. Do not take more then 5mg s in 24 hrs. Use w/3 20 mg prednisone 10 tablet 3  . olmesartan (BENICAR) 40 MG tablet Take 1 tablet daily for BP 90 tablet 1  . Omega-3 Fatty Acids (FISH  OIL) 1200 MG CAPS Take 1 capsule by mouth daily.    . valACYclovir (VALTREX) 500 MG tablet Take 500 mg by mouth daily as needed. For cold sores     No current facility-administered medications on file prior to visit.    Medical History:  Past Medical History:  Diagnosis Date  . Anxiety   . 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 HSV-1 infection   . HTN (hypertension)   . Hyperlipidemia   . Migraine   . PONV (postoperative nausea and vomiting)    Allergies:  Allergies  Allergen Reactions  . Penicillins     REACTION: hives   Review of Systems  Constitutional: Negative.   HENT: Negative for congestion, ear discharge, ear pain, hearing loss, nosebleeds, sore throat and tinnitus.   Eyes: Positive for pain and redness. Negative for blurred vision, double vision, photophobia and discharge.  Respiratory: Negative.  Negative for stridor.   Cardiovascular: Negative.   Gastrointestinal: Negative.   Genitourinary: Negative.   Musculoskeletal: Positive for neck pain. Negative for back pain, falls, joint pain and myalgias.  Skin: Negative.   Neurological: Positive for headaches. Negative for dizziness, tingling, tremors, sensory change, speech change, focal weakness, seizures and loss of consciousness.  Endo/Heme/Allergies: Negative.   Psychiatric/Behavioral: Negative.     Family history- Review and unchanged Social history- Review and unchanged Physical Exam: BP 128/66   Pulse 66   Temp 97.2 F (36.2 C)   Resp 14   Ht 5' 6.5" (1.689 m)   Wt 189 lb (85.7 kg)   LMP 09/05/2012   SpO2 97%   BMI 30.05 kg/m  Wt Readings from Last 3 Encounters:  11/10/16 189 lb (85.7 kg)  09/23/16 190 lb (86.2 kg)  07/25/16 188 lb 3.2 oz (85.4 kg)   General Appearance: Well nourished, in no apparent distress. Eyes: PERRLA, EOMs, lower left eye with erythematous bump Sinuses: No Frontal/maxillary tenderness ENT/Mouth: Ext aud canals clear, TMs without erythema, bulging. No  erythema, swelling, or exudate on post pharynx.  Tonsils not swollen or erythematous. Hearing normal.  Neck: Supple, thyroid normal.  Respiratory: Respiratory effort normal, BS equal bilaterally without rales, rhonchi, wheezing or stridor.  Cardio: RRR with no MRGs. Brisk peripheral pulses without edema.  Abdomen: Soft, + BS.  Non tender, no guarding, rebound, hernias, masses. Lymphatics: Non tender without lymphadenopathy.  Musculoskeletal: Full ROM, 5/5 strength, normal gait. Good pulses, good sensation bilaterally.  Skin: Warm, dry without rashes, lesions, ecchymosis.  Neuro: Cranial nerves intact. Normal muscle tone, no cerebellar symptoms. Sensation intact.  Psych: Awake and oriented X 3, normal affect, Insight and Judgment appropriate.    [name], PA-C 4:21 PM [facility]

## 2016-11-10 NOTE — Patient Instructions (Signed)

## 2016-11-11 LAB — VITAMIN D 25 HYDROXY (VIT D DEFICIENCY, FRACTURES): Vit D, 25-Hydroxy: 39 ng/mL (ref 30–100)

## 2016-11-21 MED FILL — ESTRADIOL 0.075 MG PATCH: 0.075 | 28 days supply | Qty: 8 | Fill #1

## 2016-11-26 ENCOUNTER — Other Ambulatory Visit: Payer: Self-pay | Admitting: Obstetrics & Gynecology

## 2016-11-26 DIAGNOSIS — Z1231 Encounter for screening mammogram for malignant neoplasm of breast: Secondary | ICD-10-CM

## 2016-12-11 DIAGNOSIS — N906 Unspecified hypertrophy of vulva: Secondary | ICD-10-CM | POA: Diagnosis not present

## 2016-12-15 ENCOUNTER — Ambulatory Visit
Admission: RE | Admit: 2016-12-15 | Discharge: 2016-12-15 | Disposition: A | Discharge status: Home / Self Care | Payer: 59 | Source: Ambulatory Visit | Attending: Obstetrics & Gynecology | Admitting: Obstetrics & Gynecology

## 2016-12-15 DIAGNOSIS — Z1231 Encounter for screening mammogram for malignant neoplasm of breast: Secondary | ICD-10-CM | POA: Diagnosis not present

## 2016-12-31 DIAGNOSIS — Z01419 Encounter for gynecological examination (general) (routine) without abnormal findings: Secondary | ICD-10-CM | POA: Diagnosis not present

## 2016-12-31 DIAGNOSIS — Z6829 Body mass index (BMI) 29.0-29.9, adult: Secondary | ICD-10-CM | POA: Diagnosis not present

## 2016-12-31 MED FILL — MINIVELLE 0.1 MG PATCH: 0.1 | 28 days supply | Qty: 8 | Fill #0

## 2017-02-23 DIAGNOSIS — M654 Radial styloid tenosynovitis [de Quervain]: Secondary | ICD-10-CM | POA: Diagnosis not present

## 2017-02-23 MED FILL — ATENOLOL 25 MG TABLET: 25 | 30 days supply | Qty: 60 | Fill #1

## 2017-02-23 MED FILL — MINIVELLE 0.1 MG PATCH: 0.1 | 28 days supply | Qty: 8 | Fill #1

## 2017-04-09 MED FILL — MINIVELLE 0.1 MG PATCH: 0.1 | 28 days supply | Qty: 8 | Fill #2

## 2017-04-15 ENCOUNTER — Other Ambulatory Visit: Payer: Self-pay | Admitting: Physician Assistant

## 2017-04-15 DIAGNOSIS — I10 Essential (primary) hypertension: Secondary | ICD-10-CM

## 2017-04-15 DIAGNOSIS — G43809 Other migraine, not intractable, without status migrainosus: Secondary | ICD-10-CM

## 2017-04-15 MED FILL — ATENOLOL 25 MG TABLET: 25 | 30 days supply | Qty: 60 | Fill #0

## 2017-05-26 MED FILL — MINIVELLE 0.1 MG PATCH: 0.1 | 28 days supply | Qty: 8 | Fill #3

## 2017-07-10 MED FILL — MINIVELLE 0.1 MG PATCH: 0.1 | 28 days supply | Qty: 8 | Fill #4

## 2017-07-10 MED FILL — ATENOLOL 25 MG TABLET: 25 | 30 days supply | Qty: 60 | Fill #1

## 2017-07-16 DIAGNOSIS — H2513 Age-related nuclear cataract, bilateral: Secondary | ICD-10-CM | POA: Diagnosis not present

## 2017-07-16 DIAGNOSIS — H40013 Open angle with borderline findings, low risk, bilateral: Secondary | ICD-10-CM | POA: Diagnosis not present

## 2017-07-16 DIAGNOSIS — H04123 Dry eye syndrome of bilateral lacrimal glands: Secondary | ICD-10-CM | POA: Diagnosis not present

## 2017-07-16 DIAGNOSIS — H25013 Cortical age-related cataract, bilateral: Secondary | ICD-10-CM | POA: Diagnosis not present

## 2017-07-16 LAB — HM DIABETES EYE EXAM

## 2017-07-21 ENCOUNTER — Encounter: Payer: Self-pay | Admitting: Internal Medicine

## 2017-07-30 DIAGNOSIS — D229 Melanocytic nevi, unspecified: Secondary | ICD-10-CM | POA: Diagnosis not present

## 2017-07-30 DIAGNOSIS — L814 Other melanin hyperpigmentation: Secondary | ICD-10-CM | POA: Diagnosis not present

## 2017-07-30 DIAGNOSIS — L308 Other specified dermatitis: Secondary | ICD-10-CM | POA: Diagnosis not present

## 2017-07-30 DIAGNOSIS — L821 Other seborrheic keratosis: Secondary | ICD-10-CM | POA: Diagnosis not present

## 2017-07-30 MED FILL — FLUTICASONE PROP 0.05% CRM: 0.05 | 10 days supply | Qty: 30 | Fill #0

## 2017-08-03 ENCOUNTER — Encounter: Payer: Self-pay | Admitting: Physician Assistant

## 2017-08-05 ENCOUNTER — Other Ambulatory Visit (HOSPITAL_COMMUNITY): Payer: Self-pay | Admitting: Sports Medicine

## 2017-08-05 DIAGNOSIS — M25531 Pain in right wrist: Secondary | ICD-10-CM

## 2017-08-05 DIAGNOSIS — M654 Radial styloid tenosynovitis [de Quervain]: Secondary | ICD-10-CM | POA: Diagnosis not present

## 2017-08-05 DIAGNOSIS — M25532 Pain in left wrist: Secondary | ICD-10-CM

## 2017-08-08 ENCOUNTER — Encounter: Payer: Self-pay | Admitting: Physician Assistant

## 2017-08-08 NOTE — Progress Notes (Addendum)
Complete Physical  Assessment and Plan: Essential hypertension - continue medications, add on atenolol at night for BP/HA, DASH diet, exercise and monitor at home. Call if greater than 130/80.  - CBC with Differential/Platelet - BASIC METABOLIC PANEL WITH GFR - Hepatic function panel - TSH - Urinalysis, Routine w reflex microscopic (not at [facility]) - Microalbumin / creatinine urine ratio - EKG 12-Lead   Hyperlipidemia -continue medications, check lipids, decrease fatty foods, increase activity.  - Lipid panel  Prediabetes Discussed general issues about diabetes pathophysiology and management., Educational material distributed., Suggested low cholesterol diet., Encouraged aerobic exercise., Discussed foot care., Reminded to get yearly retinal exam. - Hemoglobin A1c - Insulin, fasting  Medication management - Magnesium  Vitamin D deficiency - Vit D  25 hydroxy (rtn osteoporosis monitoring)  Encounter for general adult medical examination with abnormal findings 1 year  DDD (degenerative disc disease), cervical Chronic pain, suggest PT, mobic  Basal cell carcinoma of nose Follow up DERM  Other migraine without status migrainosus, not intractable  and follow up as needed   Anemia, unspecified anemia type - Ferritin - Iron and TIBC - Vitamin B12  Need for diphtheria-tetanus-pertussis (Tdap) vaccine -     Tdap vaccine greater than or equal to 7yo IM  Screening for viral disease -     HIV antibody -     Hepatitis C antibody  Urinary frequency -     Urine Culture - Versus ICS, given information, discussed diet  Screening, anemia, deficiency, iron -     Iron,Total/Total Iron Binding Cap -     Vitamin B12  Discussed med's effects and SE's. Screening labs and tests as requested with regular follow-up as recommended. Over 40 minutes of exam, counseling, chart review, and complex, high level critical decision making was performed this visit.   HPI  56 y.o. female   presents for a complete physical.  Has been having bilateral hand pain, left worse than right, following with ortho, has had 2 injection, getting MRI and will follow up with hand specialist for deQuervain.  She is tired, her mom went for back surgery several months ago, had afib/trouble getting off vent, spent 3 weeks at her house but now back alone at her house and this is a stressor. She has had urinary frequency and some burning last 3 months, no incontinence.    Her blood pressure has not been controlled at home, today their BP is BP: 126/80 She does not workout. She denies chest pain, shortness of breath, dizziness.  She is not on cholesterol medication and denies myalgias. Her cholesterol is at goal. The cholesterol last visit was:   Lab Results  Component Value Date   CHOL 211 (H) 11/10/2016   HDL 33 (L) 11/10/2016   LDLCALC 132 (H) 11/10/2016   TRIG 229 (H) 11/10/2016   CHOLHDL 6.4 (H) 11/10/2016   She has been working on diet and exercise for prediabetes, and denies paresthesia of the feet, polydipsia, polyuria and visual disturbances. Last A1C in the office was:  Lab Results  Component Value Date   HGBA1C 5.5 07/25/2016   Patient is on Vitamin D supplement.   She has history of migraines, works for [employer]. BMI is Body mass index is 26.81 kg/m., she is working on diet and exercise. Has been going to 6 AM workout 3 days a week.  Wt Readings from Last 3 Encounters:  08/10/17 171 lb 3.2 oz (77.7 kg)  11/10/16 189 lb (85.7 kg)  09/23/16 190 lb (86.2  kg)    Current Medications:  Current Outpatient Medications on File Prior to Visit  Medication Sig Dispense Refill  . ALPRAZolam (XANAX) 0.5 MG tablet Take 1 tablet (0.5 mg total) by mouth 2 (two) times daily as needed for anxiety. 60 tablet 0  . Ascorbic Acid (VITAMIN C) 1000 MG tablet Take 1,000 mg by mouth as needed. In winter for colds    . aspirin 81 MG EC tablet Take 81 mg by mouth daily.      [name] atenolol (TENORMIN) 25 MG  tablet TAKE ONE TO TWO TABLETS BY MOUTH NIGHTLY FOR BLOOD PRESSURE 60 tablet 1  . cholecalciferol (VITAMIN D) 1000 UNITS tablet Take 1,000 Units by mouth daily.    [name] Co-Enzyme Q-10 100 MG CAPS Take 1 capsule by mouth daily.    . fluocinonide cream (LIDEX) 2.24 % Apply 1 application topically 2 (two) times daily.    . fluticasone (CUTIVATE) 0.05 % cream Apply 1 application topically 2 (two) times daily.    . fluticasone (FLONASE) 50 MCG/ACT nasal spray Place 2 sprays into both nostrils at bedtime. 16 g 1  . hydrochlorothiazide (HYDRODIURIL) 12.5 MG tablet Take 1 tablet (12.5 mg total) by mouth daily. 30 tablet 0  . ibuprofen (ADVIL,MOTRIN) 200 MG tablet Take 3 tablets (600 mg total) by mouth every 6 (six) hours as needed. For pain 30 tablet 0  . ketoconazole (NIZORAL) 2 % cream Apply 1 application topically daily.    . meloxicam (MOBIC) 15 MG tablet TAKE HALF TO ONE TABLET BY MOUTH EVERY DAY WITH FOOD FOR PAIN OR inflammation.  1  . MINIVELLE 0.05 MG/24HR patch APPLY 1 PATCH TO SKIN TWICE A WEEK  12  . Multiple Vitamin (MULTIVITAMIN) tablet Take 1 tablet by mouth daily.    . naratriptan (AMERGE) 2.5 MG tablet Take 1 tablet (2.5 mg total) by mouth as needed. Do not take more then 5mg s in 24 hrs. Use w/3 20 mg prednisone 10 tablet 3  . neomycin-polymyxin b-dexamethasone (MAXITROL) 3.5-10000-0.1 SUSP Place 1 drop into the left eye 4 (four) times daily. 5 mL 1  . Omega-3 Fatty Acids (FISH OIL) 1200 MG CAPS Take 1 capsule by mouth daily.    . valACYclovir (VALTREX) 500 MG tablet Take 500 mg by mouth daily as needed. For cold sores    . olmesartan (BENICAR) 40 MG tablet Take 1 tablet daily for BP 90 tablet 1   No current facility-administered medications on file prior to visit.    Health Maintenance:   Immunization History  Administered Date(s) Administered  . Influenza-Unspecified 06/22/2013  . Tdap 09/22/2006, 08/10/2017   Tetanus: 2008 TODAY Pneumovax: N/A Prevnar 13: N/A Flu vaccine: 2018  at work Zostavax: N/A  LMP: 2013, TAH Pap: April 2016 Dr. Benjie Karvonen MGM: 11/2016 , CAT C DEXA: N/A Colonoscopy: 2011, due 10 years EGD: N/A Echo 2011  Last Dental Exam: Dr. [place] Sink Last Eye Exam: Dr. Herbert Deaner, wears glasses Patient Care Team: Unk Pinto, MD as PCP - General (Internal Medicine) Elsie Saas, MD as Consulting Physician (Orthopedic Surgery) Monna Fam, MD as Consulting Physician (Ophthalmology) Fay Records, MD as Consulting Physician (Cardiology) Inda Castle, MD as Consulting Physician (Gastroenterology) Azucena Fallen, MD as Consulting Physician (Obstetrics and Gynecology) Leeroy Cha, MD as Consulting Physician (Neurosurgery) Druscilla Brownie, MD as Consulting Physician (Dermatology)  Medical History:  Past Medical History:  Diagnosis Date  . Anxiety   . 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  HSV-1 infection   . HTN (hypertension)   . Hyperlipidemia   . Migraine   . PONV (postoperative nausea and vomiting)    Allergies Allergies  Allergen Reactions  . Penicillins     REACTION: hives    SURGICAL HISTORY She  has a past surgical history that includes Tubal ligation; acl replacement; Cervical disc surgery (2012); Robotic assisted total hysterectomy (09/10/2012); Bilateral salpingectomy (09/10/2012); and Elbow surgery (Right, 01/2014). FAMILY HISTORY Her family history includes Alzheimer's disease in her father; Breast cancer in her mother; Cancer in her mother; Diabetes in her brother; Heart disease in her brother; Hyperlipidemia in her brother; Hypertension in her brother and mother; Stroke in her mother. SOCIAL HISTORY She  reports that she is a non-smoker but has been exposed to tobacco smoke. she has never used smokeless tobacco. She reports that she drinks alcohol. She reports that she does not use drugs.  Review of Systems: Review of Systems   Constitutional: Negative.   HENT: Negative for congestion, ear discharge, ear pain, hearing loss, nosebleeds, sore throat and tinnitus.   Eyes: Negative.   Respiratory: Negative.  Negative for stridor.   Cardiovascular: Negative.   Gastrointestinal: Negative.   Genitourinary: Negative.   Musculoskeletal: Negative for back pain, falls, joint pain, myalgias and neck pain.  Skin: Negative.   Neurological: Positive for headaches. Negative for dizziness, tingling, tremors, sensory change, speech change, focal weakness, seizures and loss of consciousness.  Endo/Heme/Allergies: Negative.   Psychiatric/Behavioral: Negative.     Physical Exam: Estimated body mass index is 26.81 kg/m as calculated from the following:   Height as of this encounter: 5\' 7"  (1.702 m).   Weight as of this encounter: 171 lb 3.2 oz (77.7 kg). BP 126/80   Pulse 76   Temp (!) 97.5 F (36.4 C)   Resp 16   Ht 5\' 7"  (1.702 m)   Wt 171 lb 3.2 oz (77.7 kg)   LMP 09/05/2012   SpO2 96%   BMI 26.81 kg/m   Wt Readings from Last 3 Encounters:  08/10/17 171 lb 3.2 oz (77.7 kg)  11/10/16 189 lb (85.7 kg)  09/23/16 190 lb (86.2 kg)   General Appearance: Well nourished, in no apparent distress.  Eyes: PERRLA, EOMs, conjunctiva no swelling or erythema, normal fundi and vessels.  Sinuses: No Frontal/maxillary tenderness  ENT/Mouth: Ext aud canals clear, normal light reflex with TMs without erythema, bulging. Good dentition. No erythema, swelling, or exudate on post pharynx. Tonsils not swollen or erythematous. Hearing normal.  Neck: Supple, thyroid normal. No bruits  Respiratory: Respiratory effort normal, BS equal bilaterally without rales, rhonchi, wheezing or stridor.  Cardio: RRR without murmurs, rubs or gallops. Brisk peripheral pulses without edema.  Chest: symmetric, with normal excursions and percussion.  Breasts: defer Abdomen: Soft, nontender, no guarding, rebound, hernias, masses, or organomegaly.  Lymphatics:  Non tender without lymphadenopathy.  Genitourinary: defer Musculoskeletal: Full ROM all peripheral extremities,5/5 strength, and normal gait.  Skin: Warm, dry without rashes, lesions, ecchymosis. Neuro: Cranial nerves intact, reflexes equal bilaterally. Normal muscle tone, no cerebellar symptoms. Sensation intact.  Psych: Awake and oriented X 3, normal affect, Insight and Judgment appropriate.   EKG: WNL no ST changes. AORTA SCAN: defer  [name] 9:15 AM [facility]

## 2017-08-10 ENCOUNTER — Encounter: Payer: Self-pay | Admitting: Physician Assistant

## 2017-08-10 ENCOUNTER — Ambulatory Visit (INDEPENDENT_AMBULATORY_CARE_PROVIDER_SITE_OTHER): Payer: 59 | Admitting: Physician Assistant

## 2017-08-10 VITALS — BP 126/80 | HR 76 | Temp 97.5°F | Resp 16 | Ht 67.0 in | Wt 171.2 lb

## 2017-08-10 DIAGNOSIS — R350 Frequency of micturition: Secondary | ICD-10-CM

## 2017-08-10 DIAGNOSIS — C44311 Basal cell carcinoma of skin of nose: Secondary | ICD-10-CM

## 2017-08-10 DIAGNOSIS — R7303 Prediabetes: Secondary | ICD-10-CM

## 2017-08-10 DIAGNOSIS — Z79899 Other long term (current) drug therapy: Secondary | ICD-10-CM

## 2017-08-10 DIAGNOSIS — I10 Essential (primary) hypertension: Secondary | ICD-10-CM | POA: Diagnosis not present

## 2017-08-10 DIAGNOSIS — Z130 Encounter for screening for diseases of the blood and blood-forming organs and certain disorders involving the immune mechanism: Secondary | ICD-10-CM

## 2017-08-10 DIAGNOSIS — Z136 Encounter for screening for cardiovascular disorders: Secondary | ICD-10-CM

## 2017-08-10 DIAGNOSIS — M5030 Other cervical disc degeneration, unspecified cervical region: Secondary | ICD-10-CM

## 2017-08-10 DIAGNOSIS — Z6826 Body mass index (BMI) 26.0-26.9, adult: Secondary | ICD-10-CM

## 2017-08-10 DIAGNOSIS — Z23 Encounter for immunization: Secondary | ICD-10-CM | POA: Diagnosis not present

## 2017-08-10 DIAGNOSIS — E559 Vitamin D deficiency, unspecified: Secondary | ICD-10-CM

## 2017-08-10 DIAGNOSIS — E785 Hyperlipidemia, unspecified: Secondary | ICD-10-CM

## 2017-08-10 DIAGNOSIS — Z1159 Encounter for screening for other viral diseases: Secondary | ICD-10-CM

## 2017-08-10 DIAGNOSIS — Z0000 Encounter for general adult medical examination without abnormal findings: Secondary | ICD-10-CM

## 2017-08-10 NOTE — Patient Instructions (Signed)
Interstitial Cystitis Interstitial cystitis is a condition that causes inflammation of the bladder. The bladder is a hollow organ in the lower part of your abdomen. It stores urine after the urine is made by your kidneys. With interstitial cystitis, you may have pain in the bladder area. You may also have a frequent and urgent need to urinate. The severity of interstitial cystitis can vary from person to person. You may have flare-ups of the condition, and then it may go away for a while. For many people who have this condition, it becomes a long-term problem. What are the causes? The cause of this condition is not known. What increases the risk? This condition is more likely to develop in women. What are the signs or symptoms? Symptoms of interstitial cystitis vary, and they can change over time. Symptoms may include:  Discomfort or pain in the bladder area. This can range from mild to severe. The pain may change in intensity as the bladder fills with urine or as it empties.  Pelvic pain.  An urgent need to urinate.  Frequent urination.  Pain during sexual intercourse.  Pinpoint bleeding on the bladder wall.  For women, the symptoms often get worse during menstruation. How is this diagnosed? This condition is diagnosed by evaluating your symptoms and ruling out other causes. A physical exam will be done. Various tests may be done to rule out other conditions. Common tests include:  Urine tests.  Cystoscopy. In this test, a tool that is like a very thin telescope is used to look into your bladder.  Biopsy. This involves taking a sample of tissue from the bladder wall to be examined under a microscope.  How is this treated? There is no cure for interstitial cystitis, but treatment methods are available to control your symptoms. Work closely with your health care provider to find the treatments that will be most effective for you. Treatment options may include:  Medicines to relieve  pain and to help reduce the number of times that you feel the need to urinate.  Bladder training. This involves learning ways to control when you urinate, such as: ? Urinating at scheduled times. ? Training yourself to delay urination. ? Doing exercises (Kegel exercises) to strengthen the muscles that control urine flow.  Lifestyle changes, such as changing your diet or taking steps to control stress.  Use of a device that provides electrical stimulation in order to reduce pain.  A procedure that stretches your bladder by filling it with air or fluid.  Surgery. This is rare. It is only done for extreme cases if other treatments do not help.  Follow these instructions at home:  Take medicines only as directed by your health care provider.  Use bladder training techniques as directed. ? Keep a bladder diary to find out which foods, liquids, or activities make your symptoms worse. ? Use your bladder diary to schedule bathroom trips. If you are away from home, plan to be near a bathroom at each of your scheduled times. ? Make sure you urinate just before you leave the house and just before you go to bed.  Do Kegel exercises as directed by your health care provider.  Do not drink alcohol.  Do not use any tobacco products, including cigarettes, chewing tobacco, or electronic cigarettes. If you need help quitting, ask your health care provider.  Make dietary changes as directed by your health care provider. You may need to avoid spicy foods and foods that contain a high amount   of potassium.  Limit your drinking of beverages that stimulate urination. These include soda, coffee, and tea.  Keep all follow-up visits as directed by your health care provider. This is important. Contact a health care provider if:  Your symptoms do not get better after treatment.  Your pain and discomfort are getting worse.  You have more frequent urges to urinate.  You have a fever. Get help right away  if:  You are not able to control your bladder at all. This information is not intended to replace advice given to you by your health care provider. Make sure you discuss any questions you have with your health care provider. Document Released: 05/09/2004 Document Revised: 02/14/2016 Document Reviewed: 05/16/2014 Elsevier Interactive Patient Education  2018 [employer]  

## 2017-08-11 LAB — HEPATIC FUNCTION PANEL
AG Ratio: 1.5 (calc) (ref 1.0–2.5)
ALT: 24 U/L (ref 6–29)
AST: 29 U/L (ref 10–35)
Albumin: 4.3 g/dL (ref 3.6–5.1)
Alkaline phosphatase (APISO): 76 U/L (ref 33–130)
Bilirubin, Direct: 0.1 mg/dL (ref 0.0–0.2)
Globulin: 2.8 g/dL (calc) (ref 1.9–3.7)
Indirect Bilirubin: 0.4 mg/dL (calc) (ref 0.2–1.2)
Total Bilirubin: 0.5 mg/dL (ref 0.2–1.2)
Total Protein: 7.1 g/dL (ref 6.1–8.1)

## 2017-08-11 LAB — BASIC METABOLIC PANEL WITH GFR
BUN: 14 mg/dL (ref 7–25)
CO2: 30 mmol/L (ref 20–32)
Calcium: 9.4 mg/dL (ref 8.6–10.4)
Chloride: 102 mmol/L (ref 98–110)
Creat: 0.74 mg/dL (ref 0.50–1.05)
GFR, Est African American: 104 mL/min/{1.73_m2} (ref >=60)
GFR, Est Non African American: 90 mL/min/{1.73_m2} (ref >=60)
Glucose, Bld: 95 mg/dL (ref 65–99)
Potassium: 4.1 mmol/L (ref 3.5–5.3)
Sodium: 141 mmol/L (ref 135–146)

## 2017-08-11 LAB — MICROALBUMIN / CREATININE URINE RATIO
Creatinine, Urine: 55 mg/dL (ref 20–275)
Microalb Creat Ratio: 29 mcg/mg creat (ref <30)
Microalb, Ur: 1.6 mg/dL

## 2017-08-11 LAB — URINALYSIS, ROUTINE W REFLEX MICROSCOPIC
Bilirubin Urine: NEGATIVE
Glucose, UA: NEGATIVE
Hgb urine dipstick: NEGATIVE
Ketones, ur: NEGATIVE
Leukocytes, UA: NEGATIVE
Nitrite: NEGATIVE
Protein, ur: NEGATIVE
Specific Gravity, Urine: 1.012 (ref 1.001–1.03)
pH: >=8.5 — AB (ref 5.0–8.0)

## 2017-08-11 LAB — IRON, TOTAL/TOTAL IRON BINDING CAP
%SAT: 24 % (calc) (ref 11–50)
Iron: 77 ug/dL (ref 45–160)
TIBC: 324 mcg/dL (calc) (ref 250–450)

## 2017-08-11 LAB — URINE CULTURE
MICRO NUMBER:: 81304695
Result:: NO GROWTH
SPECIMEN QUALITY:: ADEQUATE

## 2017-08-11 LAB — CBC WITH DIFFERENTIAL/PLATELET
Basophils Absolute: 61 cells/uL (ref 0–200)
Basophils Relative: 0.9 %
Eosinophils Absolute: 82 cells/uL (ref 15–500)
Eosinophils Relative: 1.2 %
HCT: 39.5 % (ref 35.0–45.0)
Hemoglobin: 13.3 g/dL (ref 11.7–15.5)
Lymphs Abs: 1394 cells/uL (ref 850–3900)
MCH: 29.2 pg (ref 27.0–33.0)
MCHC: 33.7 g/dL (ref 32.0–36.0)
MCV: 86.6 fL (ref 80.0–100.0)
MPV: 11.6 fL (ref 7.5–12.5)
Monocytes Relative: 5.5 %
Neutro Abs: 4889 cells/uL (ref 1500–7800)
Neutrophils Relative %: 71.9 %
Platelets: 271 10*3/uL (ref 140–400)
RBC: 4.56 10*6/uL (ref 3.80–5.10)
RDW: 12.7 % (ref 11.0–15.0)
Total Lymphocyte: 20.5 %
WBC mixed population: 374 cells/uL (ref 200–950)
WBC: 6.8 10*3/uL (ref 3.8–10.8)

## 2017-08-11 LAB — LIPID PANEL
Cholesterol: 195 mg/dL (ref <200)
HDL: 41 mg/dL — ABNORMAL LOW (ref >50)
LDL Cholesterol (Calc): 130 mg/dL (calc) — ABNORMAL HIGH
Non-HDL Cholesterol (Calc): 154 mg/dL (calc) — ABNORMAL HIGH (ref <130)
Total CHOL/HDL Ratio: 4.8 (calc) (ref <5.0)
Triglycerides: 127 mg/dL (ref <150)

## 2017-08-11 LAB — HIV ANTIBODY (ROUTINE TESTING W REFLEX): HIV 1&2 Ab, 4th Generation: NONREACTIVE

## 2017-08-11 LAB — MAGNESIUM: Magnesium: 2.1 mg/dL (ref 1.5–2.5)

## 2017-08-11 LAB — HEPATITIS C ANTIBODY
Hepatitis C Ab: NONREACTIVE
SIGNAL TO CUT-OFF: 0.01 (ref <1.00)

## 2017-08-11 LAB — TSH: TSH: 1.61 mIU/L (ref 0.40–4.50)

## 2017-08-11 LAB — VITAMIN B12: Vitamin B-12: 686 pg/mL (ref 200–1100)

## 2017-08-11 LAB — VITAMIN D 25 HYDROXY (VIT D DEFICIENCY, FRACTURES): Vit D, 25-Hydroxy: 49 ng/mL (ref 30–100)

## 2017-08-19 ENCOUNTER — Ambulatory Visit (HOSPITAL_COMMUNITY)
Admission: RE | Admit: 2017-08-19 | Discharge: 2017-08-19 | Disposition: A | Discharge status: Home / Self Care | Payer: 59 | Source: Ambulatory Visit | Attending: Sports Medicine | Admitting: Sports Medicine

## 2017-08-19 DIAGNOSIS — M659 Synovitis and tenosynovitis, unspecified: Secondary | ICD-10-CM | POA: Insufficient documentation

## 2017-08-19 DIAGNOSIS — M25531 Pain in right wrist: Secondary | ICD-10-CM | POA: Insufficient documentation

## 2017-08-19 DIAGNOSIS — M25532 Pain in left wrist: Secondary | ICD-10-CM | POA: Diagnosis not present

## 2017-08-27 DIAGNOSIS — M67432 Ganglion, left wrist: Secondary | ICD-10-CM | POA: Diagnosis not present

## 2017-08-27 DIAGNOSIS — M67431 Ganglion, right wrist: Secondary | ICD-10-CM | POA: Diagnosis not present

## 2017-08-27 DIAGNOSIS — M654 Radial styloid tenosynovitis [de Quervain]: Secondary | ICD-10-CM | POA: Diagnosis not present

## 2017-09-08 ENCOUNTER — Other Ambulatory Visit: Payer: Self-pay | Admitting: Physician Assistant

## 2017-09-08 DIAGNOSIS — G43809 Other migraine, not intractable, without status migrainosus: Secondary | ICD-10-CM

## 2017-09-08 DIAGNOSIS — I10 Essential (primary) hypertension: Secondary | ICD-10-CM

## 2017-09-08 MED FILL — ATENOLOL 25 MG TABLET: 25 | 30 days supply | Qty: 60 | Fill #0

## 2017-09-09 MED FILL — OLMESARTAN MEDOXOMIL 40 MG: 40 | 90 days supply | Qty: 90 | Fill #1

## 2017-09-22 HISTORY — PX: OTHER SURGICAL HISTORY: SHX169

## 2017-09-25 ENCOUNTER — Other Ambulatory Visit: Payer: Self-pay

## 2017-11-10 ENCOUNTER — Other Ambulatory Visit: Payer: Self-pay | Admitting: Physician Assistant

## 2017-11-10 DIAGNOSIS — I10 Essential (primary) hypertension: Secondary | ICD-10-CM

## 2017-11-10 DIAGNOSIS — G43809 Other migraine, not intractable, without status migrainosus: Secondary | ICD-10-CM

## 2017-11-13 ENCOUNTER — Other Ambulatory Visit: Payer: Self-pay

## 2017-12-01 ENCOUNTER — Other Ambulatory Visit: Payer: Self-pay | Admitting: Obstetrics & Gynecology

## 2017-12-01 DIAGNOSIS — Z1231 Encounter for screening mammogram for malignant neoplasm of breast: Secondary | ICD-10-CM

## 2017-12-21 ENCOUNTER — Ambulatory Visit
Admission: RE | Admit: 2017-12-21 | Discharge: 2017-12-21 | Disposition: A | Discharge status: Home / Self Care | Payer: BLUE CROSS/BLUE SHIELD | Source: Ambulatory Visit | Attending: Obstetrics & Gynecology | Admitting: Obstetrics & Gynecology

## 2017-12-21 DIAGNOSIS — Z1231 Encounter for screening mammogram for malignant neoplasm of breast: Secondary | ICD-10-CM

## 2018-01-01 LAB — HM PAP SMEAR: HM Pap smear: NORMAL

## 2018-01-05 ENCOUNTER — Other Ambulatory Visit: Payer: Self-pay | Admitting: Adult Health

## 2018-01-05 DIAGNOSIS — G43809 Other migraine, not intractable, without status migrainosus: Secondary | ICD-10-CM

## 2018-01-05 DIAGNOSIS — I10 Essential (primary) hypertension: Secondary | ICD-10-CM

## 2018-02-09 DIAGNOSIS — E669 Obesity, unspecified: Secondary | ICD-10-CM | POA: Insufficient documentation

## 2018-02-09 DIAGNOSIS — E663 Overweight: Secondary | ICD-10-CM | POA: Insufficient documentation

## 2018-02-09 DIAGNOSIS — F419 Anxiety disorder, unspecified: Secondary | ICD-10-CM | POA: Insufficient documentation

## 2018-02-09 NOTE — Progress Notes (Signed)
FOLLOW UP  Assessment and Plan:   Hypertension Well controlled with current medications  Monitor blood pressure at home; patient to call if consistently greater than 130/80 Continue DASH diet.   Reminder to go to the ER if any CP, SOB, nausea, dizziness, severe HA, changes vision/speech, left arm numbness and tingling and jaw pain.  Cholesterol Currently above goal; working on lifestyle; add statin as indicated pending labs Continue low cholesterol diet and exercise.  Check lipid panel.   Other abnormal glucose Recent hx of prediabetes; hasn't been checked in over a year  Continue with lifestyle - diet/exercise/weight maintenance Check A1C  BMI 28 Long discussion about weight loss, diet, and exercise Recommended diet heavy in fruits and veggies and low in animal meats, cheeses, and dairy products, appropriate calorie intake Discussed ideal weight for height  - goal of 174 lb for next visit Patient will work on getting back on nutrisystem Will follow up in 3 months  Vitamin D Def Below goal at last visit; continue supplementation to maintain goal of 70-100 Defer Vit D level  Anxiety Well managed by current regimen; continue medications; reminded to avoid daily use of benzo to avoid tolerance Stress management techniques discussed, increase water, good sleep hygiene discussed, increase exercise, and increase veggies.    Continue diet and meds as discussed. Further disposition pending results of labs. Discussed med's effects and SE's.   Over 30 minutes of exam, counseling, chart review, and critical decision making was performed.   Future Appointments  Date Time Provider [place]  08/11/2018  9:00 AM [name], PA-C [id number] None    ----------------------------------------------------------------------------------------------------------------------  HPI 57 y.o. female  presents for 3 month follow up on hypertension, cholesterol, glucose management,  weight and vitamin D deficiency.   she has a diagnosis of anxiety and is currently on xanax 0.5 mg BID PRN, reports symptoms are well controlled on current regimen. she reports symptoms are well controlled and hasn't taken xanax in over 5 months.   BMI is Body mass index is 28.82 kg/m., she has been working on diet and exercise. Wt Readings from Last 3 Encounters:  02/10/18 184 lb (83.5 kg)  08/10/17 171 lb 3.2 oz (77.7 kg)  11/10/16 189 lb (85.7 kg)   Her blood pressure has been controlled at home, today their BP is BP: 128/76  She does workout. She denies chest pain, shortness of breath, dizziness.   She is not on cholesterol medication and denies myalgias. Her cholesterol is not at goal. The cholesterol last visit was:   Lab Results  Component Value Date   CHOL 195 08/10/2017   HDL 41 (L) 08/10/2017   LDLCALC 130 (H) 08/10/2017   TRIG 127 08/10/2017   CHOLHDL 4.8 08/10/2017    She has been working on diet and exercise for glucose management, and denies foot ulcerations, increased appetite, nausea, paresthesia of the feet, polydipsia, polyuria, visual disturbances, vomiting and weight loss. Last A1C in the office was:  Lab Results  Component Value Date   HGBA1C 5.5 07/25/2016   Patient is on Vitamin D supplement.    Lab Results  Component Value Date   VD25OH 49 08/10/2017        Current Medications:  Current Outpatient Medications on File Prior to Visit  Medication Sig  . ALPRAZolam (XANAX) 0.5 MG tablet Take 1 tablet (0.5 mg total) by mouth 2 (two) times daily as needed for anxiety.  . Ascorbic Acid (VITAMIN C) 1000 MG tablet Take 1,000 mg by mouth  as needed. In winter for colds  . aspirin 81 MG EC tablet Take 81 mg by mouth daily.    [name] atenolol (TENORMIN) 25 MG tablet TAKE 1 to 2 TABLETS BY MOUTH NIGHTLY FOR BLOOD PRESSURE  . cholecalciferol (VITAMIN D) 1000 UNITS tablet Take 1,000 Units by mouth daily.  . fluocinonide cream (LIDEX) 8.78 % Apply 1 application topically  2 (two) times daily.  . fluticasone (CUTIVATE) 0.05 % cream Apply 1 application topically 2 (two) times daily.  . fluticasone (FLONASE) 50 MCG/ACT nasal spray Place 2 sprays into both nostrils at bedtime.  [name] ibuprofen (ADVIL,MOTRIN) 200 MG tablet Take 3 tablets (600 mg total) by mouth every 6 (six) hours as needed. For pain  . ketoconazole (NIZORAL) 2 % cream Apply 1 application topically daily.  . meloxicam (MOBIC) 15 MG tablet TAKE HALF TO ONE TABLET BY MOUTH EVERY DAY WITH FOOD FOR PAIN OR inflammation.  [name] MINIVELLE 0.05 MG/24HR patch APPLY 1 PATCH TO SKIN TWICE A WEEK  . Multiple Vitamin (MULTIVITAMIN) tablet Take 1 tablet by mouth daily.  . naratriptan (AMERGE) 2.5 MG tablet Take 1 tablet (2.5 mg total) by mouth as needed. Do not take more then 5mg s in 24 hrs. Use w/3 20 mg prednisone  . olmesartan (BENICAR) 40 MG tablet Take 1 tablet daily for BP  . Omega-3 Fatty Acids (FISH OIL) 1200 MG CAPS Take 1 capsule by mouth daily.  . valACYclovir (VALTREX) 500 MG tablet Take 500 mg by mouth daily as needed. For cold sores  . atenolol (TENORMIN) 25 MG tablet TAKE ONE TO TWO TABLETS BY MOUTH NIGHTLY FOR BLOOD PRESSURE  . Co-Enzyme Q-10 100 MG CAPS Take 1 capsule by mouth daily.  . hydrochlorothiazide (HYDRODIURIL) 12.5 MG tablet Take 1 tablet (12.5 mg total) by mouth daily. (Patient not taking: Reported on 02/10/2018)  . neomycin-polymyxin b-dexamethasone (MAXITROL) 3.5-10000-0.1 SUSP Place 1 drop into the left eye 4 (four) times daily. (Patient not taking: Reported on 02/10/2018)   No current facility-administered medications on file prior to visit.      Allergies:  Allergies  Allergen Reactions  . Penicillins     REACTION: hives     Medical History:  Past Medical History:  Diagnosis Date  . Anxiety   . 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 HSV-1 infection   . HTN (hypertension)   . Hyperlipidemia   .  Migraine   . PONV (postoperative nausea and vomiting)    Family history- Reviewed and unchanged Social history- Reviewed and unchanged   Review of Systems:  Review of Systems  Constitutional: Negative for malaise/fatigue and weight loss.  HENT: Negative for hearing loss and tinnitus.   Eyes: Negative for blurred vision and double vision.  Respiratory: Negative for cough, shortness of breath and wheezing.   Cardiovascular: Negative for chest pain, palpitations, orthopnea, claudication and leg swelling.  Gastrointestinal: Negative for abdominal pain, blood in stool, constipation, diarrhea, heartburn, melena, nausea and vomiting.  Genitourinary: Negative.   Musculoskeletal: Negative for joint pain and myalgias.  Skin: Negative for rash.  Neurological: Negative for dizziness, tingling, sensory change, weakness and headaches.  Endo/Heme/Allergies: Negative for polydipsia.  Psychiatric/Behavioral: Negative.   All other systems reviewed and are negative.     Physical Exam: BP 128/76   Pulse 72   Temp (!) 97.3 F (36.3 C)   Ht 5\' 7"  (1.702 m)   Wt 184 lb (83.5 kg)   LMP 09/05/2012  SpO2 95%   BMI 28.82 kg/m  Wt Readings from Last 3 Encounters:  02/10/18 184 lb (83.5 kg)  08/10/17 171 lb 3.2 oz (77.7 kg)  11/10/16 189 lb (85.7 kg)   General Appearance: Well nourished, in no apparent distress. Eyes: PERRLA, EOMs, conjunctiva no swelling or erythema Sinuses: No Frontal/maxillary tenderness ENT/Mouth: Ext aud canals clear, TMs without erythema, bulging. No erythema, swelling, or exudate on post pharynx.  Tonsils not swollen or erythematous. Hearing normal.  Neck: Supple, thyroid normal.  Respiratory: Respiratory effort normal, BS equal bilaterally without rales, rhonchi, wheezing or stridor.  Cardio: RRR with no MRGs. Brisk peripheral pulses without edema.  Abdomen: Soft, + BS.  Non tender, no guarding, rebound, hernias, masses. Lymphatics: Non tender without lymphadenopathy.   Musculoskeletal: Full ROM, 5/5 strength, Normal gait Skin: Warm, dry without rashes, lesions, ecchymosis.  Neuro: Cranial nerves intact. No cerebellar symptoms.  Psych: Awake and oriented X 3, normal affect, Insight and Judgment appropriate.    Izora Ribas, NP 9:03 AM [facility]

## 2018-02-10 ENCOUNTER — Encounter: Payer: Self-pay | Admitting: Adult Health

## 2018-02-10 ENCOUNTER — Ambulatory Visit (INDEPENDENT_AMBULATORY_CARE_PROVIDER_SITE_OTHER): Payer: BLUE CROSS/BLUE SHIELD | Admitting: Adult Health

## 2018-02-10 VITALS — BP 128/76 | HR 72 | Temp 97.3°F | Ht 67.0 in | Wt 184.0 lb

## 2018-02-10 DIAGNOSIS — E785 Hyperlipidemia, unspecified: Secondary | ICD-10-CM | POA: Diagnosis not present

## 2018-02-10 DIAGNOSIS — Z79899 Other long term (current) drug therapy: Secondary | ICD-10-CM | POA: Diagnosis not present

## 2018-02-10 DIAGNOSIS — I10 Essential (primary) hypertension: Secondary | ICD-10-CM | POA: Diagnosis not present

## 2018-02-10 DIAGNOSIS — E663 Overweight: Secondary | ICD-10-CM | POA: Diagnosis not present

## 2018-02-10 DIAGNOSIS — R7309 Other abnormal glucose: Secondary | ICD-10-CM

## 2018-02-10 DIAGNOSIS — E559 Vitamin D deficiency, unspecified: Secondary | ICD-10-CM

## 2018-02-10 DIAGNOSIS — F419 Anxiety disorder, unspecified: Secondary | ICD-10-CM | POA: Diagnosis not present

## 2018-02-10 NOTE — Patient Instructions (Signed)
Aim for 7+ servings of fruits and vegetables daily  80+ fluid ounces of water or unsweet tea for healthy kidneys  Limit alcohol intake  Limit animal fats in diet for cholesterol and heart health - choose grass fed whenever available  Aim for low stress - take time to unwind and care for your mental health  Aim for 150 min of moderate intensity exercise weekly for heart health, and weights twice weekly for bone health  Aim for 7-9 hours of sleep daily     When it comes to diets, agreement about the perfect plan isn't easy to find, even among the experts. Experts at the [employer] developed an idea known as the Healthy Eating Plate. Just imagine a plate divided into logical, healthy portions.  The emphasis is on diet quality:  Load up on vegetables and fruits - one-half of your plate: Aim for color and variety, and remember that potatoes don't count.  Go for whole grains - one-quarter of your plate: Whole wheat, barley, wheat berries, quinoa, oats, brown rice, and foods made with them. If you want pasta, go with whole wheat pasta.  Protein power - one-quarter of your plate: Fish, chicken, beans, and nuts are all healthy, versatile protein sources. Limit red meat.  The diet, however, does go beyond the plate, offering a few other suggestions.  Use healthy plant oils, such as olive, canola, soy, corn, sunflower and peanut. Check the labels, and avoid partially hydrogenated oil, which have unhealthy trans fats.  If you're thirsty, drink water. Coffee and tea are good in moderation, but skip sugary drinks and limit milk and dairy products to one or two daily servings.  The type of carbohydrate in the diet is more important than the amount. Some sources of carbohydrates, such as vegetables, fruits, whole grains, and beans-are healthier than others.  Finally, stay active.     Monitor your blood pressure at home, please keep a record and bring that in with you to  your next office visit.   Go to the ER if any CP, SOB, nausea, dizziness, severe HA, changes vision/speech  Your most recent BP: BP: 128/76   Take your medications faithfully as instructed. Maintain a healthy weight. Get at least 150 minutes of aerobic exercise per week. Minimize salt intake. Minimize alcohol intake  DASH Eating Plan DASH stands for "Dietary Approaches to Stop Hypertension." The DASH eating plan is a healthy eating plan that has been shown to reduce high blood pressure (hypertension). Additional health benefits may include reducing the risk of type 2 diabetes mellitus, heart disease, and stroke. The DASH eating plan may also help with weight loss. WHAT DO I NEED TO KNOW ABOUT THE DASH EATING PLAN? For the DASH eating plan, you will follow these general guidelines:  Choose foods with a percent daily value for sodium of less than 5% (as listed on the food label).  Use salt-free seasonings or herbs instead of table salt or sea salt.  Check with your health care provider or pharmacist before using salt substitutes.  Eat lower-sodium products, often labeled as "lower sodium" or "no salt added."  Eat fresh foods.  Eat more vegetables, fruits, and low-fat dairy products.  Choose whole grains. Look for the word "whole" as the first word in the ingredient list.  Choose fish and skinless chicken or [place] more often than red meat. Limit fish, poultry, and meat to 6 oz (170 g) each day.  Limit sweets, desserts, sugars, and sugary  drinks.  Choose heart-healthy fats.  Limit cheese to 1 oz (28 g) per day.  Eat more home-cooked food and less restaurant, buffet, and fast food.  Limit fried foods.  Cook foods using methods other than frying.  Limit canned vegetables. If you do use them, rinse them well to decrease the sodium.  When eating at a restaurant, ask that your food be prepared with less salt, or no salt if possible. WHAT FOODS CAN I EAT? Seek help from a  dietitian for individual calorie needs. Grains Whole grain or whole wheat bread. Brown rice. Whole grain or whole wheat pasta. Quinoa, bulgur, and whole grain cereals. Low-sodium cereals. Corn or whole wheat flour tortillas. Whole grain cornbread. Whole grain crackers. Low-sodium crackers. Vegetables Fresh or frozen vegetables (raw, steamed, roasted, or grilled). Low-sodium or reduced-sodium tomato and vegetable juices. Low-sodium or reduced-sodium tomato sauce and paste. Low-sodium or reduced-sodium canned vegetables.  Fruits All fresh, canned (in natural juice), or frozen fruits. Meat and Other Protein Products Ground beef (85% or leaner), grass-fed beef, or beef trimmed of fat. Skinless chicken or [place]. Ground chicken or [place]. Pork trimmed of fat. All fish and seafood. Eggs. Dried beans, peas, or lentils. Unsalted nuts and seeds. Unsalted canned beans. Dairy Low-fat dairy products, such as skim or 1% milk, 2% or reduced-fat cheeses, low-fat ricotta or cottage cheese, or plain low-fat yogurt. Low-sodium or reduced-sodium cheeses. Fats and Oils Tub margarines without trans fats. Light or reduced-fat mayonnaise and salad dressings (reduced sodium). Avocado. Safflower, olive, or canola oils. Natural peanut or almond butter. Other Unsalted popcorn and pretzels. The items listed above may not be a complete list of recommended foods or beverages. Contact your dietitian for more options. WHAT FOODS ARE NOT RECOMMENDED? Grains White bread. White pasta. White rice. Refined cornbread. Bagels and croissants. Crackers that contain trans fat. Vegetables Creamed or fried vegetables. Vegetables in a cheese sauce. Regular canned vegetables. Regular canned tomato sauce and paste. Regular tomato and vegetable juices. Fruits Dried fruits. Canned fruit in light or heavy syrup. Fruit juice. Meat and Other Protein Products Fatty cuts of meat. Ribs, chicken wings, bacon, sausage, bologna, salami,  chitterlings, fatback, hot dogs, bratwurst, and packaged luncheon meats. Salted nuts and seeds. Canned beans with salt. Dairy Whole or 2% milk, cream, half-and-half, and cream cheese. Whole-fat or sweetened yogurt. Full-fat cheeses or blue cheese. Nondairy creamers and whipped toppings. Processed cheese, cheese spreads, or cheese curds. Condiments Onion and garlic salt, seasoned salt, table salt, and sea salt. Canned and packaged gravies. Worcestershire sauce. Tartar sauce. Barbecue sauce. Teriyaki sauce. Soy sauce, including reduced sodium. Steak sauce. Fish sauce. Oyster sauce. Cocktail sauce. Horseradish. Ketchup and mustard. Meat flavorings and tenderizers. Bouillon cubes. Hot sauce. Tabasco sauce. Marinades. Taco seasonings. Relishes. Fats and Oils Butter, stick margarine, lard, shortening, ghee, and bacon fat. Coconut, palm kernel, or palm oils. Regular salad dressings. Other Pickles and olives. Salted popcorn and pretzels. The items listed above may not be a complete list of foods and beverages to avoid. Contact your dietitian for more information. WHERE CAN I FIND MORE INFORMATION? National Heart, Lung, and [employer] Institute: travelstabloid.com Document Released: 08/28/2011 Document Revised: 01/23/2014 Document Reviewed: 07/13/2013 [facility] Patient Information 2015 [place], [place]. This information is not intended to replace advice given to you by your health care provider. Make sure you discuss any questions you have with your health care provider.

## 2018-02-10 NOTE — Addendum Note (Signed)
Addended by: Izora Ribas on: 02/10/2018 09:07 AM   Modules accepted: Orders

## 2018-02-11 LAB — CBC WITH DIFFERENTIAL/PLATELET
Basophils Absolute: 61 cells/uL (ref 0–200)
Basophils Relative: 1 %
Eosinophils Absolute: 110 cells/uL (ref 15–500)
Eosinophils Relative: 1.8 %
HCT: 41.2 % (ref 35.0–45.0)
Hemoglobin: 14.1 g/dL (ref 11.7–15.5)
Lymphs Abs: 1629 cells/uL (ref 850–3900)
MCH: 30 pg (ref 27.0–33.0)
MCHC: 34.2 g/dL (ref 32.0–36.0)
MCV: 87.7 fL (ref 80.0–100.0)
MPV: 11.5 fL (ref 7.5–12.5)
Monocytes Relative: 7.2 %
Neutro Abs: 3861 cells/uL (ref 1500–7800)
Neutrophils Relative %: 63.3 %
Platelets: 273 10*3/uL (ref 140–400)
RBC: 4.7 10*6/uL (ref 3.80–5.10)
RDW: 12 % (ref 11.0–15.0)
Total Lymphocyte: 26.7 %
WBC mixed population: 439 cells/uL (ref 200–950)
WBC: 6.1 10*3/uL (ref 3.8–10.8)

## 2018-02-11 LAB — LIPID PANEL
Cholesterol: 205 mg/dL — ABNORMAL HIGH (ref <200)
HDL: 41 mg/dL — ABNORMAL LOW (ref >50)
LDL Cholesterol (Calc): 138 mg/dL (calc) — ABNORMAL HIGH
Non-HDL Cholesterol (Calc): 164 mg/dL (calc) — ABNORMAL HIGH (ref <130)
Total CHOL/HDL Ratio: 5 (calc) — ABNORMAL HIGH (ref <5.0)
Triglycerides: 140 mg/dL (ref <150)

## 2018-02-11 LAB — HEMOGLOBIN A1C
Hgb A1c MFr Bld: 5.8 % of total Hgb — ABNORMAL HIGH (ref <5.7)
Mean Plasma Glucose: 120 (calc)
eAG (mmol/L): 6.6 (calc)

## 2018-02-11 LAB — COMPLETE METABOLIC PANEL WITH GFR
AG Ratio: 1.6 (calc) (ref 1.0–2.5)
ALT: 20 U/L (ref 6–29)
AST: 23 U/L (ref 10–35)
Albumin: 4.4 g/dL (ref 3.6–5.1)
Alkaline phosphatase (APISO): 85 U/L (ref 33–130)
BUN: 19 mg/dL (ref 7–25)
CO2: 30 mmol/L (ref 20–32)
Calcium: 10.3 mg/dL (ref 8.6–10.4)
Chloride: 104 mmol/L (ref 98–110)
Creat: 0.82 mg/dL (ref 0.50–1.05)
GFR, Est African American: 91 mL/min/{1.73_m2} (ref >=60)
GFR, Est Non African American: 79 mL/min/{1.73_m2} (ref >=60)
Globulin: 2.7 g/dL (calc) (ref 1.9–3.7)
Glucose, Bld: 84 mg/dL (ref 65–99)
Potassium: 4.6 mmol/L (ref 3.5–5.3)
Sodium: 140 mmol/L (ref 135–146)
Total Bilirubin: 0.4 mg/dL (ref 0.2–1.2)
Total Protein: 7.1 g/dL (ref 6.1–8.1)

## 2018-02-11 LAB — TSH: TSH: 2 mIU/L (ref 0.40–4.50)

## 2018-05-30 ENCOUNTER — Telehealth: Payer: BLUE CROSS/BLUE SHIELD | Admitting: Family

## 2018-05-30 DIAGNOSIS — J019 Acute sinusitis, unspecified: Secondary | ICD-10-CM

## 2018-05-30 MED ORDER — DOXYCYCLINE HYCLATE 100 MG PO TABS: 100.0000 mg | ORAL_TABLET | Freq: Two times a day (BID) | ORAL | 0 refills | Status: DC

## 2018-05-30 NOTE — Progress Notes (Signed)

## 2018-06-23 ENCOUNTER — Other Ambulatory Visit: Payer: Self-pay | Admitting: Adult Health

## 2018-06-23 DIAGNOSIS — G43809 Other migraine, not intractable, without status migrainosus: Secondary | ICD-10-CM

## 2018-06-23 DIAGNOSIS — I10 Essential (primary) hypertension: Secondary | ICD-10-CM

## 2018-07-13 ENCOUNTER — Encounter: Payer: Self-pay | Admitting: Physician Assistant

## 2018-08-02 ENCOUNTER — Other Ambulatory Visit: Payer: Self-pay | Admitting: Adult Health

## 2018-08-06 DIAGNOSIS — L218 Other seborrheic dermatitis: Secondary | ICD-10-CM | POA: Diagnosis not present

## 2018-08-06 DIAGNOSIS — L814 Other melanin hyperpigmentation: Secondary | ICD-10-CM | POA: Diagnosis not present

## 2018-08-06 DIAGNOSIS — D229 Melanocytic nevi, unspecified: Secondary | ICD-10-CM | POA: Diagnosis not present

## 2018-08-06 DIAGNOSIS — L821 Other seborrheic keratosis: Secondary | ICD-10-CM | POA: Diagnosis not present

## 2018-08-10 NOTE — Progress Notes (Signed)
Complete Physical  Assessment and Plan: Essential hypertension - continue medications, suggest increased benicar to 40mg  and continue atenolol once at night for BP/HA, DASH diet, exercise and monitor at home. Call if greater than 130/80.  - CBC with Differential/Platelet - BASIC METABOLIC PANEL WITH GFR - Hepatic function panel - TSH - Urinalysis, Routine w reflex microscopic (not at [facility]) - Microalbumin / creatinine urine ratio - EKG 12-Lead   Hyperlipidemia -continue medications, check lipids, decrease fatty foods, increase activity.  - Lipid panel  Prediabetes Discussed general issues about diabetes pathophysiology and management., Educational material distributed., Suggested low cholesterol diet., Encouraged aerobic exercise., Discussed foot care., Reminded to get yearly retinal exam. - Hemoglobin A1c  Medication management - Magnesium  Vitamin D deficiency - Vit D  25 hydroxy (rtn osteoporosis monitoring)  Encounter for general adult medical examination with abnormal findings 1 year  DDD (degenerative disc disease), cervical Chronic pain, suggest PT, mobic  Basal cell carcinoma of nose Follow up DERM  Other migraine without status migrainosus, not intractable  and follow up as needed- well controlled at this time  Screening, anemia, deficiency, iron -     Iron,Total/Total Iron Binding Cap -     Vitamin B12  Anxiety Can do FMLA for patient for her mom Suggest talking with someone about hospice for her mother.  Continue medications as needed  Discussed med's effects and SE's. Screening labs and tests as requested with regular follow-up as recommended. Over 40 minutes of exam, counseling, chart review, and complex, high level critical decision making was performed this visit.  Future Appointments  Date Time Provider [place]  02/16/2019  9:00 AM [name], PA-C [facility] None  08/23/2019  9:00 AM [name], PA-C [facility] None    HPI   57 y.o. female  presents for a complete physical.  Her mom has been sick, has lung cancer then pneumonia, has been in hospital x 10 days, then rehab in [place] and has pneumonia again. She has 2 brothers and she is health care power of attorney, just turned 83 Saturday.   Her blood pressure has not been controlled at home due to stress, she is normally on benicar 20mg  and atenolol 25 once a day, will occ take benicar 40mg  and atenolol 25, today their BP is BP: 122/86 She does not workout. She denies chest pain, shortness of breath, dizziness.  She is not on cholesterol medication and denies myalgias. Her cholesterol is at goal. The cholesterol last visit was:   Lab Results  Component Value Date   CHOL 205 (H) 02/10/2018   HDL 41 (L) 02/10/2018   LDLCALC 138 (H) 02/10/2018   TRIG 140 02/10/2018   CHOLHDL 5.0 (H) 02/10/2018   She has been working on diet and exercise for prediabetes, and denies paresthesia of the feet, polydipsia, polyuria and visual disturbances. Last A1C in the office was:  Lab Results  Component Value Date   HGBA1C 5.8 (H) 02/10/2018   Patient is on Vitamin D supplement.   She has history of migraines, works for [employer]. BMI is Body mass index is 28.41 kg/m., she is working on diet and exercise. Wt Readings from Last 3 Encounters:  08/11/18 181 lb 6.4 oz (82.3 kg)  02/10/18 184 lb (83.5 kg)  08/10/17 171 lb 3.2 oz (77.7 kg)    Current Medications:  Current Outpatient Medications on File Prior to Visit  Medication Sig Dispense Refill  . ALPRAZolam (XANAX) 0.5 MG tablet Take 1 tablet (0.5 mg total) by  mouth 2 (two) times daily as needed for anxiety. 60 tablet 0  . Ascorbic Acid (VITAMIN C) 1000 MG tablet Take 1,000 mg by mouth as needed. In winter for colds    . aspirin 81 MG EC tablet Take 81 mg by mouth daily.      [name] atenolol (TENORMIN) 25 MG tablet TAKE 1 to 2 TABLETS BY MOUTH NIGHTLY FOR BLOOD PRESSURE 90 tablet 1  . cholecalciferol (VITAMIN D) 1000 UNITS tablet  Take 1,000 Units by mouth daily.    . fluocinonide cream (LIDEX) 9.37 % Apply 1 application topically 2 (two) times daily.    . fluticasone (CUTIVATE) 0.05 % cream Apply 1 application topically 2 (two) times daily.    . fluticasone (FLONASE) 50 MCG/ACT nasal spray Place 2 sprays into both nostrils at bedtime. 16 g 1  . ibuprofen (ADVIL,MOTRIN) 200 MG tablet Take 3 tablets (600 mg total) by mouth every 6 (six) hours as needed. For pain 30 tablet 0  . ketoconazole (NIZORAL) 2 % cream Apply 1 application topically daily.    . meloxicam (MOBIC) 15 MG tablet TAKE HALF TO ONE TABLET BY MOUTH EVERY DAY WITH FOOD FOR PAIN OR inflammation.  1  . MINIVELLE 0.05 MG/24HR patch APPLY 1 PATCH TO SKIN TWICE A WEEK  12  . Multiple Vitamin (MULTIVITAMIN) tablet Take 1 tablet by mouth daily.    . naratriptan (AMERGE) 2.5 MG tablet Take 1 tablet (2.5 mg total) by mouth as needed. Do not take more then 5mg s in 24 hrs. Use w/3 20 mg prednisone 10 tablet 3  . olmesartan (BENICAR) 40 MG tablet Take 1 tablet daily for BP 90 tablet 1  . Omega-3 Fatty Acids (FISH OIL) 1200 MG CAPS Take 1 capsule by mouth daily.    . valACYclovir (VALTREX) 500 MG tablet Take 500 mg by mouth daily as needed. For cold sores     No current facility-administered medications on file prior to visit.    Health Maintenance:   Immunization History  Administered Date(s) Administered  . Influenza-Unspecified 06/22/2013, 06/22/2017, 07/12/2018  . Tdap 09/22/2006, 08/10/2017  . Zoster Recombinat (Shingrix) 06/22/2017, 11/04/2017   Tetanus: 2018 Pneumovax: N/A Prevnar 13: N/A Flu vaccine: 2019 at work Zostavax: N/A  LMP: 2013, TAH Pap: April 2019 Dr. Benjie Karvonen MGM: 11/2017 , CAT C DEXA: N/A Colonoscopy: 2011, due 10 years EGD: N/A Echo 2011  Last Dental Exam: Dr. Nulato Sink Last Eye Exam: Dr. Herbert Deaner, wears glasses Patient Care Team: Unk Pinto, MD as PCP - General (Internal Medicine) Elsie Saas, MD as Consulting Physician (Orthopedic  Surgery) Monna Fam, MD as Consulting Physician (Ophthalmology) Fay Records, MD as Consulting Physician (Cardiology) Inda Castle, MD (Inactive) as Consulting Physician (Gastroenterology) Azucena Fallen, MD as Consulting Physician (Obstetrics and Gynecology) Leeroy Cha, MD as Consulting Physician (Neurosurgery) Druscilla Brownie, MD as Consulting Physician (Dermatology)  Medical History:  Past Medical History:  Diagnosis Date  . Anxiety   . 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 HSV-1 infection   . HTN (hypertension)   . Hyperlipidemia   . Migraine   . PONV (postoperative nausea and vomiting)    Allergies Allergies  Allergen Reactions  . Penicillins     REACTION: hives    SURGICAL HISTORY She  has a past surgical history that includes Tubal ligation; acl replacement; Cervical disc surgery (2012); Robotic assisted total hysterectomy (09/10/2012); Bilateral salpingectomy (09/10/2012); and Lionel December release (Bilateral, 2019). FAMILY HISTORY Her family  history includes Alzheimer's disease in her father; Breast cancer in her mother; Cancer in her mother; Diabetes in her brother; Heart disease in her brother; Hyperlipidemia in her brother; Hypertension in her brother and mother; Stroke in her mother. SOCIAL HISTORY She  reports that she is a non-smoker but has been exposed to tobacco smoke. She has never used smokeless tobacco. She reports that she drinks alcohol. She reports that she does not use drugs.  Review of Systems: Review of Systems  Constitutional: Negative.   HENT: Negative for congestion, ear discharge, ear pain, hearing loss, nosebleeds, sore throat and tinnitus.   Eyes: Negative.   Respiratory: Negative.  Negative for stridor.   Cardiovascular: Negative.   Gastrointestinal: Negative.   Genitourinary: Negative.   Musculoskeletal: Negative for back pain, falls, joint  pain, myalgias and neck pain.  Skin: Negative.   Neurological: Negative for dizziness, tingling, tremors, sensory change, speech change, focal weakness, seizures, loss of consciousness and headaches.  Endo/Heme/Allergies: Negative.   Psychiatric/Behavioral: Negative for depression, hallucinations, memory loss, substance abuse and suicidal ideas. The patient is nervous/anxious. The patient does not have insomnia.     Physical Exam: Estimated body mass index is 28.41 kg/m as calculated from the following:   Height as of this encounter: 5\' 7"  (1.702 m).   Weight as of this encounter: 181 lb 6.4 oz (82.3 kg). BP 122/86   Pulse 70   Temp 97.8 F (36.6 C)   Resp 16   Ht 5\' 7"  (1.702 m)   Wt 181 lb 6.4 oz (82.3 kg)   LMP 09/05/2012   SpO2 (!) 70%   BMI 28.41 kg/m   Wt Readings from Last 3 Encounters:  08/11/18 181 lb 6.4 oz (82.3 kg)  02/10/18 184 lb (83.5 kg)  08/10/17 171 lb 3.2 oz (77.7 kg)   General Appearance: Well nourished, in no apparent distress.  Eyes: PERRLA, EOMs, conjunctiva no swelling or erythema, normal fundi and vessels.  Sinuses: No Frontal/maxillary tenderness  ENT/Mouth: Ext aud canals clear, normal light reflex with TMs without erythema, bulging. Good dentition. No erythema, swelling, or exudate on post pharynx. Tonsils not swollen or erythematous. Hearing normal.  Neck: Supple, thyroid normal. No bruits  Respiratory: Respiratory effort normal, BS equal bilaterally without rales, rhonchi, wheezing or stridor.  Cardio: RRR without murmurs, rubs or gallops. Brisk peripheral pulses without edema.  Chest: symmetric, with normal excursions and percussion.  Breasts: defer Abdomen: Soft, nontender, no guarding, rebound, hernias, masses, or organomegaly.  Lymphatics: Non tender without lymphadenopathy.  Genitourinary: defer Musculoskeletal: Full ROM all peripheral extremities,5/5 strength, and normal gait.  Skin: Warm, dry without rashes, lesions, ecchymosis. Neuro:  Cranial nerves intact, reflexes equal bilaterally. Normal muscle tone, no cerebellar symptoms. Sensation intact.  Psych: Awake and oriented X 3, normal affect, Insight and Judgment appropriate.   EKG: WNL no ST changes. AORTA SCAN: defer  [name] 9:21 AM [facility]

## 2018-08-11 ENCOUNTER — Ambulatory Visit (INDEPENDENT_AMBULATORY_CARE_PROVIDER_SITE_OTHER): Payer: BLUE CROSS/BLUE SHIELD | Admitting: Physician Assistant

## 2018-08-11 ENCOUNTER — Encounter: Payer: Self-pay | Admitting: Physician Assistant

## 2018-08-11 VITALS — BP 122/86 | HR 70 | Temp 97.8°F | Resp 16 | Ht 67.0 in | Wt 181.4 lb

## 2018-08-11 DIAGNOSIS — M5030 Other cervical disc degeneration, unspecified cervical region: Secondary | ICD-10-CM

## 2018-08-11 DIAGNOSIS — E785 Hyperlipidemia, unspecified: Secondary | ICD-10-CM

## 2018-08-11 DIAGNOSIS — Z1329 Encounter for screening for other suspected endocrine disorder: Secondary | ICD-10-CM

## 2018-08-11 DIAGNOSIS — Z131 Encounter for screening for diabetes mellitus: Secondary | ICD-10-CM | POA: Diagnosis not present

## 2018-08-11 DIAGNOSIS — C44311 Basal cell carcinoma of skin of nose: Secondary | ICD-10-CM

## 2018-08-11 DIAGNOSIS — F419 Anxiety disorder, unspecified: Secondary | ICD-10-CM

## 2018-08-11 DIAGNOSIS — I10 Essential (primary) hypertension: Secondary | ICD-10-CM

## 2018-08-11 DIAGNOSIS — Z13220 Encounter for screening for lipoid disorders: Secondary | ICD-10-CM

## 2018-08-11 DIAGNOSIS — E663 Overweight: Secondary | ICD-10-CM

## 2018-08-11 DIAGNOSIS — Z79899 Other long term (current) drug therapy: Secondary | ICD-10-CM | POA: Diagnosis not present

## 2018-08-11 DIAGNOSIS — Z1389 Encounter for screening for other disorder: Secondary | ICD-10-CM | POA: Diagnosis not present

## 2018-08-11 DIAGNOSIS — Z0000 Encounter for general adult medical examination without abnormal findings: Secondary | ICD-10-CM

## 2018-08-11 DIAGNOSIS — Z130 Encounter for screening for diseases of the blood and blood-forming organs and certain disorders involving the immune mechanism: Secondary | ICD-10-CM

## 2018-08-11 DIAGNOSIS — R7309 Other abnormal glucose: Secondary | ICD-10-CM

## 2018-08-11 DIAGNOSIS — E559 Vitamin D deficiency, unspecified: Secondary | ICD-10-CM | POA: Diagnosis not present

## 2018-08-11 MED ORDER — OLMESARTAN MEDOXOMIL 40 MG PO TABS: ORAL_TABLET | ORAL | 0 refills | Status: DC

## 2018-08-11 MED ORDER — OLMESARTAN MEDOXOMIL 40 MG PO TABS: ORAL_TABLET | ORAL | 3 refills | Status: DC

## 2018-08-11 NOTE — Patient Instructions (Signed)
VENOUS INSUFFICIENCY Our lower leg venous system is not the most reliable, the heart does NOT pump fluid up, there is a valve system.  The muscles of the leg squeeze and the blood moves up and a valve opens and close, then they squeeze, blood moves up and valves open and closes keeping the blood moving towards the heart.  Lots can go wrong with this valve system.  If someone is sitting or standing without movement, everyone will get swelling.  THINGS TO DO:  Do not stand or sit in one position for long periods of time. Do not sit with your legs crossed. Rest with your legs raised during the day.  Your legs have to be higher than your heart so that gravity will force the valves to open, so please really elevate your legs.   Wear elastic stockings or support hose. Do not wear other tight, encircling garments around the legs, pelvis, or waist.  [employer]  has a wide variety of well priced compression stockings. [place], [place] [address] [id number] has a good cheap selection, I like the socks, they are not as hard to get on  Walk as much as possible to increase blood flow.  Raise the foot of your bed at night with 2-inch blocks.  SEEK MEDICAL CARE IF:   The skin around your ankle starts to break down.  You have pain, redness, tenderness, or hard swelling developing in your leg over a vein.  You are uncomfortable due to leg pain.  If you ever have shortness of breath with exertion or chest pain go to the ER.   Check out vionics for your planta fasciitis  Do water bottle frozen and roll your foot on it Get inserts for your plantar fasciitis Do stretches at night If it is not better in 6-8 weeks we can do an injection   Plantar Fasciitis  Plantar fasciitis is a painful foot condition that affects the heel. It occurs when the band of tissue that connects the toes to the heel bone (plantar fascia) becomes irritated. This can happen after  exercising too much or doing other repetitive activities (overuse injury). The pain from plantar fasciitis can range from mild irritation to severe pain that makes it difficult for you to walk or move. The pain is usually worse in the morning or after you have been sitting or lying down for a while. What are the causes? This condition may be caused by:  Standing for long periods of time.  Wearing shoes that do not fit.  Doing high-impact activities, including running, aerobics, and ballet.  Being overweight.  Having an abnormal way of walking (gait).  Having tight calf muscles.  Having high arches in your feet.  Starting a new athletic activity.  What are the signs or symptoms? The main symptom of this condition is heel pain. Other symptoms include:  Pain that gets worse after activity or exercise.  Pain that is worse in the morning or after resting.  Pain that goes away after you walk for a few minutes.  How is this diagnosed? This condition may be diagnosed based on your signs and symptoms. Your health care provider will also do a physical exam to check for:  A tender area on the bottom of your foot.  A high arch in your foot.  Pain when you move your foot.  Difficulty moving your foot.  You may also need to have imaging studies to  confirm the diagnosis. These can include:  X-rays.  Ultrasound.  MRI.  How is this treated? Treatment for plantar fasciitis depends on the severity of the condition. Your treatment may include:  Rest, ice, and over-the-counter pain medicines to manage your pain.  Exercises to stretch your calves and your plantar fascia.  A splint that holds your foot in a stretched, upward position while you sleep (night splint).  Physical therapy to relieve symptoms and prevent problems in the future.  Cortisone injections to relieve severe pain.  Extracorporeal shock wave therapy (ESWT) to stimulate damaged plantar fascia with electrical  impulses. It is often used as a last resort before surgery.  Surgery, if other treatments have not worked after 12 months.  Follow these instructions at home:  Take medicines only as directed by your health care provider.  Avoid activities that cause pain.  Roll the bottom of your foot over a bag of ice or a bottle of cold water. Do this for 20 minutes, 3-4 times a day.  Perform simple stretches as directed by your health care provider.  Try wearing athletic shoes with air-sole or gel-sole cushions or soft shoe inserts.  Wear a night splint while sleeping, if directed by your health care provider.  Keep all follow-up appointments with your health care provider. How is this prevented?  Do not perform exercises or activities that cause heel pain.  Consider finding low-impact activities if you continue to have problems.  Lose weight if you need to. The best way to prevent plantar fasciitis is to avoid the activities that aggravate your plantar fascia. Contact a health care provider if:  Your symptoms do not go away after treatment with home care measures.  Your pain gets worse.  Your pain affects your ability to move or do your daily activities. This information is not intended to replace advice given to you by your health care provider. Make sure you discuss any questions you have with your health care provider. Document Released: 06/03/2001 Document Revised: 02/11/2016 Document Reviewed: 07/19/2014 Elsevier Interactive Patient Education  2018 [place] is a care service which can be used by people who are terminally ill and in whom healing is no longer thought possible.  It is meant to help with the two largest fears near the end of life (the fears of dying and of being alone), as well as pain management, and an attempt to allow people to pass away comfortably at home.  Hospice staff:  Administer appropriate pain relief.  Provide nursing  care.  Offer reassurance and support to loved ones and family members.  Provide services to keep people comfortable at home or in a hospice facility. Together, you can see to it that your loved one is not alone during this last and important phase of life. You, your family, and your caregivers help you decide when hospice services should begin. If your condition improves or the disease goes into remission, you can be discharged from the hospice program. You can return to hospice care at a later time if needed. The hospice philosophy recognizes death as the final stage of life. It helps patients continue an alert, pain-free life, and manage symptoms while surrounded by their loved ones. Hospice affirms life without hurrying death. Hospice care treats the person rather than the disease. It emphasizes quality of life with family-centered care. Hospice care involves the patient and family and helps them make decisions.  The care is designed to:  Relieve or decrease pain.  Control other problems.  Provide as much quality time as possible.  Allow people to die with dignity. The goal of hospice care is to offer as high a quality of life as possible during the end of life. In this way, the last days of life may be spent with dignity.  With hospice care, instead of spending the last weeks or months in a hospital, a person is with loved ones in the home or a homelike setting. About 90 percent of hospice care is provided at home. But hospice is available wherever a person lives, including a nursing home or assisted-living residence. Some residential hospices designed specifically for hospice care also exist. Hospice care is available for many types of terminal illnesses. Hospice services are meant to serve both the patient and family members.  Comfort. In most cases, the individual stays in his or her home or in homelike surroundings instead of in a hospital. The core of hospice is a cooperative effort by  family, friends and a team of professional and volunteer caregivers working together to meet your loved one's needs. This team supplies all necessary medicines and equipment. It works with both the person involved and family members to relieve pain and symptoms.  Support. Individuals enjoy the support of loved ones by receiving much of the basic care from family and friends. A nurse may lead the team and coordinates the day-to-day care. A doctor is also part of the team. Chaplains and social workers are available to counsel the family and their loved one. They make sure emotional, spiritual, and social needs are being met. Trained volunteers perform a wide variety of tasks as needed, such as:  Providing companionship.  Doing light housekeeping.  Preparing meals.  Running errands.  Providing respite for the family.  Improving quality of life. Caring for someone who is dying is emotionally and physically demanding. This can be particularly true for family members who are primary caregivers. But you can take comfort in knowing that hospice is an act of love that can improve the quality of life for all involved. Professionals are often available to tend to the needs of grieving family members as well.  Spiritual Care. Hospice care emphasizes the spiritual needs of you and your family. People differ in their spiritual needs and religious beliefs so spiritual care is individualized to meet the persons' and family's needs. It may include helping you to look at what death means to you, to say good-bye, or to perform a specific religious ceremony or ritual. HOW TO SELECT A PROGRAM Most hospice programs are run by nonprofit, independent organizations. Some are affiliated with hospitals, nursing homes or home health care agencies. Some are for-profit organizations. You can learn about existing hospice programs in your area from your health caregivers. ASK THE FOLLOWING:  What services are available to the  patient?  What services are offered to the family?  Are bereavement services available?  How involved are the family members?  How involved is the doctor?  Who makes up the hospice care team? How are they trained or screened?  How will the individual's pain and symptoms be managed?  If circumstances change, can services be provided in different settings, such as the home or the hospital?  Is the program reviewed and licensed by the state or certified in some other way?  Are all costs covered by insurance? How much you pay for hospice care can vary greatly. It depends on the length and  type of care necessary and your insurance coverage. Medicare and most private insurance plans, including managed care organizations, cover hospice care. Hospice is also covered by Medicaid in most states. Some hospice programs provide services on a sliding fee scale, based on your ability to pay. They may also provide some durable medical equipment for support within the home.

## 2018-08-12 LAB — URINALYSIS, ROUTINE W REFLEX MICROSCOPIC
Bacteria, UA: NONE SEEN /HPF
Bilirubin Urine: NEGATIVE
Glucose, UA: NEGATIVE
Hgb urine dipstick: NEGATIVE
Hyaline Cast: NONE SEEN /LPF
Ketones, ur: NEGATIVE
Leukocytes, UA: NEGATIVE
Nitrite: NEGATIVE
RBC / HPF: NONE SEEN /HPF (ref 0–2)
Specific Gravity, Urine: 1.026 (ref 1.001–1.03)
WBC, UA: NONE SEEN /HPF (ref 0–5)
pH: 6 (ref 5.0–8.0)

## 2018-08-12 LAB — CBC WITH DIFFERENTIAL/PLATELET
Basophils Absolute: 59 cells/uL (ref 0–200)
Basophils Relative: 1 %
Eosinophils Absolute: 89 cells/uL (ref 15–500)
Eosinophils Relative: 1.5 %
HCT: 41.2 % (ref 35.0–45.0)
Hemoglobin: 14 g/dL (ref 11.7–15.5)
Lymphs Abs: 1664 cells/uL (ref 850–3900)
MCH: 30 pg (ref 27.0–33.0)
MCHC: 34 g/dL (ref 32.0–36.0)
MCV: 88.4 fL (ref 80.0–100.0)
MPV: 11.2 fL (ref 7.5–12.5)
Monocytes Relative: 6.6 %
Neutro Abs: 3699 cells/uL (ref 1500–7800)
Neutrophils Relative %: 62.7 %
Platelets: 264 10*3/uL (ref 140–400)
RBC: 4.66 10*6/uL (ref 3.80–5.10)
RDW: 12.5 % (ref 11.0–15.0)
Total Lymphocyte: 28.2 %
WBC mixed population: 389 cells/uL (ref 200–950)
WBC: 5.9 10*3/uL (ref 3.8–10.8)

## 2018-08-12 LAB — IRON, TOTAL/TOTAL IRON BINDING CAP
%SAT: 35 % (calc) (ref 16–45)
Iron: 127 ug/dL (ref 45–160)
TIBC: 362 mcg/dL (calc) (ref 250–450)

## 2018-08-12 LAB — HEMOGLOBIN A1C
Hgb A1c MFr Bld: 5.9 % of total Hgb — ABNORMAL HIGH (ref <5.7)
Mean Plasma Glucose: 123 (calc)
eAG (mmol/L): 6.8 (calc)

## 2018-08-12 LAB — COMPLETE METABOLIC PANEL WITH GFR
AG Ratio: 1.6 (calc) (ref 1.0–2.5)
ALT: 17 U/L (ref 6–29)
AST: 20 U/L (ref 10–35)
Albumin: 4.5 g/dL (ref 3.6–5.1)
Alkaline phosphatase (APISO): 89 U/L (ref 33–130)
BUN: 21 mg/dL (ref 7–25)
CO2: 30 mmol/L (ref 20–32)
Calcium: 9.7 mg/dL (ref 8.6–10.4)
Chloride: 103 mmol/L (ref 98–110)
Creat: 0.74 mg/dL (ref 0.50–1.05)
GFR, Est African American: 103 mL/min/{1.73_m2} (ref >=60)
GFR, Est Non African American: 89 mL/min/{1.73_m2} (ref >=60)
Globulin: 2.8 g/dL (calc) (ref 1.9–3.7)
Glucose, Bld: 96 mg/dL (ref 65–99)
Potassium: 4.2 mmol/L (ref 3.5–5.3)
Sodium: 139 mmol/L (ref 135–146)
Total Bilirubin: 0.6 mg/dL (ref 0.2–1.2)
Total Protein: 7.3 g/dL (ref 6.1–8.1)

## 2018-08-12 LAB — LIPID PANEL
Cholesterol: 212 mg/dL — ABNORMAL HIGH (ref <200)
HDL: 40 mg/dL — ABNORMAL LOW (ref >50)
LDL Cholesterol (Calc): 146 mg/dL (calc) — ABNORMAL HIGH
Non-HDL Cholesterol (Calc): 172 mg/dL (calc) — ABNORMAL HIGH (ref <130)
Total CHOL/HDL Ratio: 5.3 (calc) — ABNORMAL HIGH (ref <5.0)
Triglycerides: 137 mg/dL (ref <150)

## 2018-08-12 LAB — MICROALBUMIN / CREATININE URINE RATIO
Creatinine, Urine: 162 mg/dL (ref 20–275)
Microalb Creat Ratio: 26 mcg/mg creat (ref <30)
Microalb, Ur: 4.2 mg/dL

## 2018-08-12 LAB — MAGNESIUM: Magnesium: 2.1 mg/dL (ref 1.5–2.5)

## 2018-08-12 LAB — TSH: TSH: 2.13 mIU/L (ref 0.40–4.50)

## 2018-08-12 LAB — VITAMIN B12: Vitamin B-12: 685 pg/mL (ref 200–1100)

## 2018-08-12 LAB — VITAMIN D 25 HYDROXY (VIT D DEFICIENCY, FRACTURES): Vit D, 25-Hydroxy: 44 ng/mL (ref 30–100)

## 2018-08-15 ENCOUNTER — Other Ambulatory Visit: Payer: Self-pay

## 2018-08-16 ENCOUNTER — Other Ambulatory Visit: Payer: Self-pay | Admitting: Physician Assistant

## 2018-08-16 ENCOUNTER — Other Ambulatory Visit: Payer: Self-pay

## 2018-08-16 DIAGNOSIS — I10 Essential (primary) hypertension: Secondary | ICD-10-CM

## 2018-08-16 DIAGNOSIS — G43809 Other migraine, not intractable, without status migrainosus: Secondary | ICD-10-CM

## 2018-08-16 MED ORDER — ATENOLOL 25 MG PO TABS: ORAL_TABLET | ORAL | 1 refills | Status: DC

## 2018-09-10 ENCOUNTER — Telehealth: Payer: Self-pay

## 2018-09-10 MED ORDER — OSELTAMIVIR PHOSPHATE 75 MG PO CAPS: 75.0000 mg | ORAL_CAPSULE | Freq: Every day | ORAL | 0 refills | Status: AC

## 2018-09-10 NOTE — Telephone Encounter (Signed)
Pt would like to know HOW long is INFLUENZA contagious?  Please advise

## 2018-09-10 NOTE — Telephone Encounter (Signed)
-----   Message from [name], [place] sent at 09/10/2018 11:41 AM EST ----- Regarding: RE: poss flu contact Make sure she does strict contact precautions, hand washing. Hand sanitizer does not kill the flu.  Estill Bamberg ----- Message ----- From: Elenor Quinones, CMA Sent: 09/10/2018  10:08 AM EST To: [name], PA-C Subject: poss flu contact                               Pt's coworker tested + for influenza both A & B Due to this Mrs. Manzi is worried about carrying something to her mother. Please advise  Pharmacy:[facility]

## 2018-09-17 ENCOUNTER — Telehealth: Payer: Self-pay | Admitting: Physician Assistant

## 2018-09-17 MED ORDER — PREDNISONE 20 MG PO TABS: ORAL_TABLET | ORAL | 0 refills | Status: DC

## 2018-09-17 MED ORDER — AZITHROMYCIN 250 MG PO TABS: ORAL_TABLET | ORAL | 1 refills | Status: AC

## 2018-09-17 MED ORDER — PROMETHAZINE-DM 6.25-15 MG/5ML PO SYRP: 5.0000 mL | ORAL_SOLUTION | Freq: Four times a day (QID) | ORAL | 1 refills | Status: DC | PRN

## 2018-09-17 NOTE — Telephone Encounter (Signed)
57 y.o. female calls with 8 days of URI symptoms. She is on Claritin, antihistamines.  Symptoms include chills, sore throat, sinus pressure and productive cough  Problem list has Hyperlipidemia; Essential hypertension; Other abnormal glucose; Medication management; Vitamin D deficiency; Basal cell carcinoma of nose; DDD (degenerative disc disease), cervical; Overweight (BMI 25.0-29.9); and Anxiety on their problem list.  Medications Current Outpatient Medications on File Prior to Visit  Medication Sig  . ALPRAZolam (XANAX) 0.5 MG tablet TAKE 1 TABLET BY MOUTH 2 TIMES DAILY AS NEEDED  . Ascorbic Acid (VITAMIN C) 1000 MG tablet Take 1,000 mg by mouth as needed. In winter for colds  . aspirin 81 MG EC tablet Take 81 mg by mouth daily.    [name] atenolol (TENORMIN) 25 MG tablet TAKE 1 to 2 TABLETS BY MOUTH NIGHTLY FOR BLOOD PRESSURE  . cholecalciferol (VITAMIN D) 1000 UNITS tablet Take 1,000 Units by mouth daily.  . fluocinonide cream (LIDEX) 4.49 % Apply 1 application topically 2 (two) times daily.  . fluticasone (CUTIVATE) 0.05 % cream Apply 1 application topically 2 (two) times daily.  . fluticasone (FLONASE) 50 MCG/ACT nasal spray Place 2 sprays into both nostrils at bedtime.  [name] ibuprofen (ADVIL,MOTRIN) 200 MG tablet Take 3 tablets (600 mg total) by mouth every 6 (six) hours as needed. For pain  . ketoconazole (NIZORAL) 2 % cream Apply 1 application topically daily.  . meloxicam (MOBIC) 15 MG tablet TAKE HALF TO ONE TABLET BY MOUTH EVERY DAY WITH FOOD FOR PAIN OR inflammation.  [name] MINIVELLE 0.05 MG/24HR patch APPLY 1 PATCH TO SKIN TWICE A WEEK  . Multiple Vitamin (MULTIVITAMIN) tablet Take 1 tablet by mouth daily.  . naratriptan (AMERGE) 2.5 MG tablet Take 1 tablet (2.5 mg total) by mouth as needed. Do not take more then 5mg s in 24 hrs. Use w/3 20 mg prednisone  . olmesartan (BENICAR) 40 MG tablet Take 1 tablet daily for BP  . Omega-3 Fatty Acids (FISH OIL) 1200 MG CAPS Take 1 capsule by mouth  daily.  [name] oseltamivir (TAMIFLU) 75 MG capsule Take 1 capsule (75 mg total) by mouth daily for 10 days.  . valACYclovir (VALTREX) 500 MG tablet Take 500 mg by mouth daily as needed. For cold sores   No current facility-administered medications on file prior to visit.     Allergies  Allergen Reactions  . Penicillins     REACTION: hives    I have prescribed I have prescribed Azithromyin 250 mg: two tablets now and then one tablet daily for 4 additonal days.  Make sure you are on an allergy pill such as claritin, allegra or zyrtec.  You may use an oral decongestant such as Mucinex D or if you have glaucoma or high blood pressure use plain Mucinex.  This is cough meds that you can take: A prescription cough medication called Tessalon Perles 100mg . You may take 1-2 capsules every 8 hours as needed for your cough.  If you develop worsening sinus pain, fever or notice severe headache and vision changes, or if symptoms are not better after completion of antibiotic, please schedule an appointment with a health care provider. If you are getting worse please go to the ER.

## 2018-09-17 NOTE — Telephone Encounter (Signed)
Pt has been made aware of Rx/meds that were sent to pharmacy.

## 2018-09-24 ENCOUNTER — Other Ambulatory Visit: Payer: Self-pay

## 2018-09-24 MED ORDER — OLMESARTAN MEDOXOMIL 40 MG PO TABS: ORAL_TABLET | ORAL | 1 refills | Status: DC

## 2018-12-21 ENCOUNTER — Other Ambulatory Visit: Payer: Self-pay | Admitting: Physician Assistant

## 2019-01-29 ENCOUNTER — Other Ambulatory Visit: Payer: Self-pay | Admitting: Physician Assistant

## 2019-01-29 DIAGNOSIS — G43809 Other migraine, not intractable, without status migrainosus: Secondary | ICD-10-CM

## 2019-01-29 DIAGNOSIS — I10 Essential (primary) hypertension: Secondary | ICD-10-CM

## 2019-02-15 NOTE — Progress Notes (Signed)
FOLLOW UP  Assessment and Plan:   Essential hypertension - continue medications, DASH diet, exercise and monitor at home. Call if greater than 130/80.  -     CBC with Differential/Platelet -     COMPLETE METABOLIC PANEL WITH GFR -     TSH  Hyperlipidemia, unspecified hyperlipidemia type -     Lipid panel check lipids decrease fatty foods increase activity.   Other abnormal glucose Discussed disease progression and risks Discussed diet/exercise, weight management and risk modification -     Hemoglobin A1c -     Insulin, random  Medication management -     Magnesium  Vitamin D deficiency -     VITAMIN D 25 Hydroxy (Vit-D Deficiency, Fractures)  Chest pressure -     EKG 12-Lead- no EKG changes- IRBBB, PRWP - suggest possible sleep study Get on PPI for possible GERD - Go to the ER if any chest pain, shortness of breath, nausea, dizziness, severe HA, changes vision/speech -     omeprazole (PRILOSEC) 20 MG capsule; Take 1 capsule (20 mg total) by mouth 2 (two) times daily.  Grief -     buPROPion (WELLBUTRIN XL) 150 MG 24 hr tablet; Take 1 tablet (150 mg total) by mouth every morning. - start on wellbutrin, stop if any headaches, nausea, worsening anxiety   Continue diet and meds as discussed. Further disposition pending results of labs. Discussed med's effects and SE's.   Over 30 minutes of exam, counseling, chart review, and critical decision making was performed.   Future Appointments  Date Time Provider [place]  08/23/2019  9:00 AM [name], PA-C [id number] None    ----------------------------------------------------------------------------------------------------------------------  HPI 58 y.o. female  presents for 3 month follow up on hypertension, cholesterol, glucose management, weight and vitamin D deficiency.   Mother passed in March of this year lung cancer. She has been working remote from her mothers house. She is dealing with the estate  now, has two brothers but she is handling everything. She feels she has not had a moment to breath.   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 Unknown if she is grieving right now. She states it is hard to focus at work. She has also had episodes of pressure in her chest. This AM she woke up at 5 AM with heaviness in her chest, some palpitations, states she is always sweating, no other accompaniments, she feels the weight of the world is what she states.  BP was 138/89.  She has xanax and was taking nightly for several weeks after her mom died but has not taken any in a week.  She is on 1 25mg  atenolol and benicar 40mg .  She is minivelle patch. No swelling in her legs.  Echo 2011  BMI is Body mass index is 29.79 kg/m., she has been working on diet and exercise. Wt Readings from Last 3 Encounters:  02/16/19 190 lb 3.2 oz (86.3 kg)  08/11/18 181 lb 6.4 oz (82.3 kg)  02/10/18 184 lb (83.5 kg)   Her blood pressure has been controlled at home, today their BP is BP: 128/86  She does workout. She denies chest pain, shortness of breath, dizziness.   She is not on cholesterol medication and denies myalgias. Her cholesterol is not at goal. The cholesterol last visit was:   Lab Results  Component Value Date   CHOL 212 (H) 08/11/2018   HDL 40 (L) 08/11/2018  LDLCALC 146 (H) 08/11/2018   TRIG 137 08/11/2018   CHOLHDL 5.3 (H) 08/11/2018    She has been working on diet and exercise for glucose management, and denies foot ulcerations, increased appetite, nausea, paresthesia of the feet, polydipsia, polyuria, visual disturbances, vomiting and weight loss. Last A1C in the office was:  Lab Results  Component Value Date   HGBA1C 5.9 (H) 08/11/2018   Patient is on Vitamin D supplement.    Lab Results  Component Value Date   VD25OH 44 08/11/2018        Current Medications:  Current Outpatient Medications on File Prior to Visit   Medication Sig  . ALPRAZolam (XANAX) 0.5 MG tablet TAKE 1 TABLET BY MOUTH 2 TIMES DAILY AS NEEDED  . Ascorbic Acid (VITAMIN C) 1000 MG tablet Take 1,000 mg by mouth as needed. In winter for colds  . aspirin 81 MG EC tablet Take 81 mg by mouth daily.    [name] atenolol (TENORMIN) 25 MG tablet Take 1 to 2 tablets at Bedtime for BP & Migraine Prophylaxis  . cholecalciferol (VITAMIN D) 1000 UNITS tablet Take 1,000 Units by mouth daily.  . fluocinonide cream (LIDEX) 3.25 % Apply 1 application topically 2 (two) times daily.  . fluticasone (CUTIVATE) 0.05 % cream Apply 1 application topically 2 (two) times daily.  . fluticasone (FLONASE) 50 MCG/ACT nasal spray Place 2 sprays into both nostrils at bedtime.  [name] ibuprofen (ADVIL,MOTRIN) 200 MG tablet Take 3 tablets (600 mg total) by mouth every 6 (six) hours as needed. For pain  . ketoconazole (NIZORAL) 2 % cream Apply 1 application topically daily.  . meloxicam (MOBIC) 15 MG tablet TAKE HALF TO ONE TABLET BY MOUTH EVERY DAY WITH FOOD FOR PAIN OR inflammation.  [name] MINIVELLE 0.05 MG/24HR patch APPLY 1 PATCH TO SKIN TWICE A WEEK  . Multiple Vitamin (MULTIVITAMIN) tablet Take 1 tablet by mouth daily.  . naratriptan (AMERGE) 2.5 MG tablet Take 1 tablet (2.5 mg total) by mouth as needed. Do not take more then 5mg s in 24 hrs. Use w/3 20 mg prednisone  . olmesartan (BENICAR) 40 MG tablet Take 1 tablet daily for BP  . Omega-3 Fatty Acids (FISH OIL) 1200 MG CAPS Take 1 capsule by mouth daily.  . predniSONE (DELTASONE) 20 MG tablet 2 tablets daily for 3 days, 1 tablet daily for 4 days.  . promethazine-dextromethorphan (PROMETHAZINE-DM) 6.25-15 MG/5ML syrup Take 5 mLs by mouth 4 (four) times daily as needed for cough.  . valACYclovir (VALTREX) 500 MG tablet TAKE ONE TABLET BY MOUTH EVERY DAY   No current facility-administered medications on file prior to visit.      Allergies:  Allergies  Allergen Reactions  . Penicillins     REACTION: hives     Medical  History:  Past Medical History:  Diagnosis Date  . Anxiety   . 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 HSV-1 infection   . HTN (hypertension)   . Hyperlipidemia   . Migraine   . PONV (postoperative nausea and vomiting)    Family history- Reviewed and unchanged Social history- Reviewed and unchanged   Review of Systems:  Review of Systems  Constitutional: Positive for malaise/fatigue. Negative for chills, fever and weight loss.  HENT: Negative for hearing loss and tinnitus.   Eyes: Negative.  Negative for blurred vision and double vision.  Respiratory: Negative for cough, hemoptysis, sputum production, shortness of breath and wheezing.   Cardiovascular: Positive for  chest pain. Negative for palpitations, orthopnea, claudication, leg swelling and PND.  Gastrointestinal: Positive for heartburn. Negative for abdominal pain, blood in stool, constipation, diarrhea, melena, nausea and vomiting.  Genitourinary: Negative.   Musculoskeletal: Negative for joint pain and myalgias.  Skin: Negative for rash.  Neurological: Negative for dizziness, tingling, sensory change, weakness and headaches.  Endo/Heme/Allergies: Negative for polydipsia.  Psychiatric/Behavioral: Positive for depression. Negative for hallucinations, memory loss, substance abuse and suicidal ideas. The patient is not nervous/anxious and does not have insomnia.   All other systems reviewed and are negative.   Physical Exam: BP 128/86   Pulse 60   Temp (!) 97.3 F (36.3 C)   Ht 5\' 7"  (1.702 m)   Wt 190 lb 3.2 oz (86.3 kg)   LMP 09/05/2012   SpO2 98%   BMI 29.79 kg/m  Wt Readings from Last 3 Encounters:  02/16/19 190 lb 3.2 oz (86.3 kg)  08/11/18 181 lb 6.4 oz (82.3 kg)  02/10/18 184 lb (83.5 kg)   General Appearance: Well nourished, in no apparent distress. Eyes: PERRLA, EOMs, conjunctiva no swelling or erythema Sinuses: No  Frontal/maxillary tenderness ENT/Mouth: Ext aud canals clear, TMs without erythema, bulging. No erythema, swelling, or exudate on post pharynx.  Tonsils not swollen or erythematous. Hearing normal.  Neck: Supple, thyroid normal.  Respiratory: Respiratory effort normal, BS equal bilaterally without rales, rhonchi, wheezing or stridor.  Cardio: RRR with no MRGs. Brisk peripheral pulses without edema.  Abdomen: Soft, + BS.  Non tender, no guarding, rebound, hernias, masses. Lymphatics: Non tender without lymphadenopathy.  Musculoskeletal: Full ROM, 5/5 strength, Normal gait Skin: Warm, dry without rashes, lesions, ecchymosis.  Neuro: Cranial nerves intact. No cerebellar symptoms.  Psych: Awake and oriented X 3, normal affect, Insight and Judgment appropriate.    [name], PA-C 10:13 AM [facility]

## 2019-02-16 ENCOUNTER — Encounter: Payer: Self-pay | Admitting: Physician Assistant

## 2019-02-16 ENCOUNTER — Ambulatory Visit (INDEPENDENT_AMBULATORY_CARE_PROVIDER_SITE_OTHER): Payer: BLUE CROSS/BLUE SHIELD | Admitting: Physician Assistant

## 2019-02-16 ENCOUNTER — Other Ambulatory Visit: Payer: Self-pay

## 2019-02-16 VITALS — BP 128/86 | HR 60 | Temp 97.3°F | Ht 67.0 in | Wt 190.2 lb

## 2019-02-16 DIAGNOSIS — R7309 Other abnormal glucose: Secondary | ICD-10-CM

## 2019-02-16 DIAGNOSIS — Z79899 Other long term (current) drug therapy: Secondary | ICD-10-CM

## 2019-02-16 DIAGNOSIS — E785 Hyperlipidemia, unspecified: Secondary | ICD-10-CM | POA: Diagnosis not present

## 2019-02-16 DIAGNOSIS — I10 Essential (primary) hypertension: Secondary | ICD-10-CM

## 2019-02-16 DIAGNOSIS — R0789 Other chest pain: Secondary | ICD-10-CM

## 2019-02-16 DIAGNOSIS — F4321 Adjustment disorder with depressed mood: Secondary | ICD-10-CM

## 2019-02-16 DIAGNOSIS — E559 Vitamin D deficiency, unspecified: Secondary | ICD-10-CM

## 2019-02-16 MED ORDER — BUPROPION HCL ER (XL) 150 MG PO TB24: 150.0000 mg | ORAL_TABLET | ORAL | 2 refills | Status: DC

## 2019-02-16 MED ORDER — OMEPRAZOLE 20 MG PO CPDR: 20.0000 mg | DELAYED_RELEASE_CAPSULE | Freq: Two times a day (BID) | ORAL | 1 refills | Status: DC

## 2019-02-16 NOTE — Patient Instructions (Addendum)
[facility] This is a free counseling service Address: [address], [place], [place] [address]  Phone: [phone]  Check vionic shoes   Will start you on wellbutrin to try to help with mood/weight loss If you get nausea and HA after the first week please stop If you get anxious or snappy with people than stop the medication But this medication can help with energy, weight loss and mood It kicks in about 1-2 weeks And can be stopped quickly  Go to the ER if any chest pain, shortness of breath, nausea, dizziness, severe HA, changes vision/speech   Mobic is an antiinflammatory It helps pain, can not take with aleve, or ibuprofen You can take tylenol (500mg ) or tylenol arthritis (650mg ) with the meloxicam/antiinflammatories. The max you can take of tylenol a day is 3000mg  daily, this is a max of 6 pills a day of the regular tyelnol (500mg ) or a max of 4 a day of the tylenol arthritis (650mg ) as long as no other medications you are taking contain tylenol.   Mobic can cause inflammation in your stomach and can cause ulcers or bleeding, this will look like black tarry stools Make sure you take your mobic with food Try not to take it daily, take AS needed Can take with zantac   Take omeprazole over the counter for 2 weeks, then go to pepcid 20 or 40mg  at night for 2 weeks, then you can stop or continue as needed.  Avoid alcohol, spicy foods, NSAIDS (aleve, ibuprofen) at this time. See foods below.   Food Choices for Gastroesophageal Reflux Disease When you have gastroesophageal reflux disease (GERD), the foods you eat and your eating habits are very important. Choosing the right foods can help ease the discomfort of GERD. WHAT GENERAL GUIDELINES DO I NEED TO FOLLOW?  Choose fruits, vegetables, whole grains, low-fat dairy products, and low-fat meat, fish, and poultry.  Limit fats such as oils, salad dressings, butter, nuts, and avocado.  Keep a food diary to identify foods  that cause symptoms.  Avoid foods that cause reflux. These may be different for different people.  Eat frequent small meals instead of three large meals each day.  Eat your meals slowly, in a relaxed setting.  Limit fried foods.  Cook foods using methods other than frying.  Avoid drinking alcohol.  Avoid drinking large amounts of liquids with your meals.  Avoid bending over or lying down until 2-3 hours after eating. WHAT FOODS ARE NOT RECOMMENDED? The following are some foods and drinks that may worsen your symptoms: Vegetables Tomatoes. Tomato juice. Tomato and spaghetti sauce. Chili peppers. Onion and garlic. Horseradish. Fruits Oranges, grapefruit, and lemon (fruit and juice). Meats High-fat meats, fish, and poultry. This includes hot dogs, ribs, ham, sausage, salami, and bacon. Dairy Whole milk and chocolate milk. Sour cream. Cream. Butter. Ice cream. Cream cheese.  Beverages Coffee and tea, with or without caffeine. Carbonated beverages or energy drinks. Condiments Hot sauce. Barbecue sauce.  Sweets/Desserts Chocolate and cocoa. Donuts. Peppermint and spearmint. Fats and Oils High-fat foods, including [place] fries and potato chips. Other Vinegar. Strong spices, such as black pepper, white pepper, red pepper, cayenne, curry powder, cloves, ginger, and chili powder.

## 2019-02-17 LAB — LIPID PANEL
Cholesterol: 207 mg/dL — ABNORMAL HIGH (ref <200)
HDL: 38 mg/dL — ABNORMAL LOW (ref >=50)
LDL Cholesterol (Calc): 137 mg/dL (calc) — ABNORMAL HIGH
Non-HDL Cholesterol (Calc): 169 mg/dL (calc) — ABNORMAL HIGH (ref <130)
Total CHOL/HDL Ratio: 5.4 (calc) — ABNORMAL HIGH (ref <5.0)
Triglycerides: 184 mg/dL — ABNORMAL HIGH (ref <150)

## 2019-02-17 LAB — COMPLETE METABOLIC PANEL WITH GFR
AG Ratio: 1.8 (calc) (ref 1.0–2.5)
ALT: 24 U/L (ref 6–29)
AST: 23 U/L (ref 10–35)
Albumin: 4.5 g/dL (ref 3.6–5.1)
Alkaline phosphatase (APISO): 95 U/L (ref 37–153)
BUN: 17 mg/dL (ref 7–25)
CO2: 28 mmol/L (ref 20–32)
Calcium: 9.8 mg/dL (ref 8.6–10.4)
Chloride: 104 mmol/L (ref 98–110)
Creat: 0.73 mg/dL (ref 0.50–1.05)
GFR, Est African American: 104 mL/min/{1.73_m2} (ref >=60)
GFR, Est Non African American: 90 mL/min/{1.73_m2} (ref >=60)
Globulin: 2.5 g/dL (calc) (ref 1.9–3.7)
Glucose, Bld: 93 mg/dL (ref 65–99)
Potassium: 4.6 mmol/L (ref 3.5–5.3)
Sodium: 140 mmol/L (ref 135–146)
Total Bilirubin: 0.4 mg/dL (ref 0.2–1.2)
Total Protein: 7 g/dL (ref 6.1–8.1)

## 2019-02-17 LAB — CBC WITH DIFFERENTIAL/PLATELET
Absolute Monocytes: 391 cells/uL (ref 200–950)
Basophils Absolute: 62 cells/uL (ref 0–200)
Basophils Relative: 1 %
Eosinophils Absolute: 149 cells/uL (ref 15–500)
Eosinophils Relative: 2.4 %
HCT: 40.5 % (ref 35.0–45.0)
Hemoglobin: 13.6 g/dL (ref 11.7–15.5)
Lymphs Abs: 1389 cells/uL (ref 850–3900)
MCH: 29.7 pg (ref 27.0–33.0)
MCHC: 33.6 g/dL (ref 32.0–36.0)
MCV: 88.4 fL (ref 80.0–100.0)
MPV: 11.3 fL (ref 7.5–12.5)
Monocytes Relative: 6.3 %
Neutro Abs: 4210 cells/uL (ref 1500–7800)
Neutrophils Relative %: 67.9 %
Platelets: 266 10*3/uL (ref 140–400)
RBC: 4.58 10*6/uL (ref 3.80–5.10)
RDW: 12.6 % (ref 11.0–15.0)
Total Lymphocyte: 22.4 %
WBC: 6.2 10*3/uL (ref 3.8–10.8)

## 2019-02-17 LAB — HEMOGLOBIN A1C
Hgb A1c MFr Bld: 5.7 % of total Hgb — ABNORMAL HIGH (ref <5.7)
Mean Plasma Glucose: 117 (calc)
eAG (mmol/L): 6.5 (calc)

## 2019-02-17 LAB — TSH: TSH: 2.71 mIU/L (ref 0.40–4.50)

## 2019-02-17 LAB — INSULIN, RANDOM: Insulin: 15.6 u[IU]/mL

## 2019-02-17 LAB — MAGNESIUM: Magnesium: 2.2 mg/dL (ref 1.5–2.5)

## 2019-02-17 LAB — VITAMIN D 25 HYDROXY (VIT D DEFICIENCY, FRACTURES): Vit D, 25-Hydroxy: 44 ng/mL (ref 30–100)

## 2019-04-14 DIAGNOSIS — M7661 Achilles tendinitis, right leg: Secondary | ICD-10-CM | POA: Diagnosis not present

## 2019-04-14 DIAGNOSIS — M7662 Achilles tendinitis, left leg: Secondary | ICD-10-CM | POA: Diagnosis not present

## 2019-04-20 DIAGNOSIS — M25571 Pain in right ankle and joints of right foot: Secondary | ICD-10-CM | POA: Diagnosis not present

## 2019-04-20 DIAGNOSIS — S86011D Strain of right Achilles tendon, subsequent encounter: Secondary | ICD-10-CM | POA: Diagnosis not present

## 2019-04-20 DIAGNOSIS — M25572 Pain in left ankle and joints of left foot: Secondary | ICD-10-CM | POA: Diagnosis not present

## 2019-04-20 DIAGNOSIS — S86012D Strain of left Achilles tendon, subsequent encounter: Secondary | ICD-10-CM | POA: Diagnosis not present

## 2019-04-26 DIAGNOSIS — M25572 Pain in left ankle and joints of left foot: Secondary | ICD-10-CM | POA: Diagnosis not present

## 2019-04-26 DIAGNOSIS — S86011D Strain of right Achilles tendon, subsequent encounter: Secondary | ICD-10-CM | POA: Diagnosis not present

## 2019-04-26 DIAGNOSIS — M25571 Pain in right ankle and joints of right foot: Secondary | ICD-10-CM | POA: Diagnosis not present

## 2019-04-26 DIAGNOSIS — S86012D Strain of left Achilles tendon, subsequent encounter: Secondary | ICD-10-CM | POA: Diagnosis not present

## 2019-04-28 DIAGNOSIS — M25572 Pain in left ankle and joints of left foot: Secondary | ICD-10-CM | POA: Diagnosis not present

## 2019-04-28 DIAGNOSIS — M25672 Stiffness of left ankle, not elsewhere classified: Secondary | ICD-10-CM | POA: Diagnosis not present

## 2019-04-28 DIAGNOSIS — M25671 Stiffness of right ankle, not elsewhere classified: Secondary | ICD-10-CM | POA: Diagnosis not present

## 2019-04-28 DIAGNOSIS — M25571 Pain in right ankle and joints of right foot: Secondary | ICD-10-CM | POA: Diagnosis not present

## 2019-05-03 DIAGNOSIS — M25571 Pain in right ankle and joints of right foot: Secondary | ICD-10-CM | POA: Diagnosis not present

## 2019-05-03 DIAGNOSIS — S86012D Strain of left Achilles tendon, subsequent encounter: Secondary | ICD-10-CM | POA: Diagnosis not present

## 2019-05-03 DIAGNOSIS — M25572 Pain in left ankle and joints of left foot: Secondary | ICD-10-CM | POA: Diagnosis not present

## 2019-05-03 DIAGNOSIS — S86011D Strain of right Achilles tendon, subsequent encounter: Secondary | ICD-10-CM | POA: Diagnosis not present

## 2019-05-05 DIAGNOSIS — M25572 Pain in left ankle and joints of left foot: Secondary | ICD-10-CM | POA: Diagnosis not present

## 2019-05-05 DIAGNOSIS — M25571 Pain in right ankle and joints of right foot: Secondary | ICD-10-CM | POA: Diagnosis not present

## 2019-05-05 DIAGNOSIS — S86012D Strain of left Achilles tendon, subsequent encounter: Secondary | ICD-10-CM | POA: Diagnosis not present

## 2019-05-05 DIAGNOSIS — S86011D Strain of right Achilles tendon, subsequent encounter: Secondary | ICD-10-CM | POA: Diagnosis not present

## 2019-05-11 DIAGNOSIS — M25572 Pain in left ankle and joints of left foot: Secondary | ICD-10-CM | POA: Diagnosis not present

## 2019-05-11 DIAGNOSIS — S86011D Strain of right Achilles tendon, subsequent encounter: Secondary | ICD-10-CM | POA: Diagnosis not present

## 2019-05-11 DIAGNOSIS — S86012D Strain of left Achilles tendon, subsequent encounter: Secondary | ICD-10-CM | POA: Diagnosis not present

## 2019-05-11 DIAGNOSIS — M25571 Pain in right ankle and joints of right foot: Secondary | ICD-10-CM | POA: Diagnosis not present

## 2019-05-12 ENCOUNTER — Other Ambulatory Visit: Payer: Self-pay | Admitting: Physician Assistant

## 2019-05-12 DIAGNOSIS — F4321 Adjustment disorder with depressed mood: Secondary | ICD-10-CM

## 2019-05-13 DIAGNOSIS — M25671 Stiffness of right ankle, not elsewhere classified: Secondary | ICD-10-CM | POA: Diagnosis not present

## 2019-05-13 DIAGNOSIS — S86011D Strain of right Achilles tendon, subsequent encounter: Secondary | ICD-10-CM | POA: Diagnosis not present

## 2019-05-13 DIAGNOSIS — S86012D Strain of left Achilles tendon, subsequent encounter: Secondary | ICD-10-CM | POA: Diagnosis not present

## 2019-05-13 DIAGNOSIS — M25572 Pain in left ankle and joints of left foot: Secondary | ICD-10-CM | POA: Diagnosis not present

## 2019-05-18 DIAGNOSIS — S86012D Strain of left Achilles tendon, subsequent encounter: Secondary | ICD-10-CM | POA: Diagnosis not present

## 2019-05-18 DIAGNOSIS — M25572 Pain in left ankle and joints of left foot: Secondary | ICD-10-CM | POA: Diagnosis not present

## 2019-05-18 DIAGNOSIS — M25571 Pain in right ankle and joints of right foot: Secondary | ICD-10-CM | POA: Diagnosis not present

## 2019-05-18 DIAGNOSIS — S86011D Strain of right Achilles tendon, subsequent encounter: Secondary | ICD-10-CM | POA: Diagnosis not present

## 2019-06-01 DIAGNOSIS — S83011D Lateral subluxation of right patella, subsequent encounter: Secondary | ICD-10-CM | POA: Diagnosis not present

## 2019-06-01 DIAGNOSIS — M25571 Pain in right ankle and joints of right foot: Secondary | ICD-10-CM | POA: Diagnosis not present

## 2019-06-01 DIAGNOSIS — M25572 Pain in left ankle and joints of left foot: Secondary | ICD-10-CM | POA: Diagnosis not present

## 2019-06-01 DIAGNOSIS — S86012D Strain of left Achilles tendon, subsequent encounter: Secondary | ICD-10-CM | POA: Diagnosis not present

## 2019-06-07 DIAGNOSIS — S86011D Strain of right Achilles tendon, subsequent encounter: Secondary | ICD-10-CM | POA: Diagnosis not present

## 2019-06-07 DIAGNOSIS — M25572 Pain in left ankle and joints of left foot: Secondary | ICD-10-CM | POA: Diagnosis not present

## 2019-06-07 DIAGNOSIS — M25571 Pain in right ankle and joints of right foot: Secondary | ICD-10-CM | POA: Diagnosis not present

## 2019-06-07 DIAGNOSIS — S86012D Strain of left Achilles tendon, subsequent encounter: Secondary | ICD-10-CM | POA: Diagnosis not present

## 2019-06-15 DIAGNOSIS — S86012D Strain of left Achilles tendon, subsequent encounter: Secondary | ICD-10-CM | POA: Diagnosis not present

## 2019-06-15 DIAGNOSIS — M25572 Pain in left ankle and joints of left foot: Secondary | ICD-10-CM | POA: Diagnosis not present

## 2019-06-15 DIAGNOSIS — M25571 Pain in right ankle and joints of right foot: Secondary | ICD-10-CM | POA: Diagnosis not present

## 2019-06-15 DIAGNOSIS — S86011D Strain of right Achilles tendon, subsequent encounter: Secondary | ICD-10-CM | POA: Diagnosis not present

## 2019-06-22 DIAGNOSIS — S86012D Strain of left Achilles tendon, subsequent encounter: Secondary | ICD-10-CM | POA: Diagnosis not present

## 2019-06-22 DIAGNOSIS — M25572 Pain in left ankle and joints of left foot: Secondary | ICD-10-CM | POA: Diagnosis not present

## 2019-06-22 DIAGNOSIS — M25571 Pain in right ankle and joints of right foot: Secondary | ICD-10-CM | POA: Diagnosis not present

## 2019-06-22 DIAGNOSIS — S86011D Strain of right Achilles tendon, subsequent encounter: Secondary | ICD-10-CM | POA: Diagnosis not present

## 2019-07-20 ENCOUNTER — Other Ambulatory Visit: Payer: Self-pay | Admitting: Physician Assistant

## 2019-07-20 DIAGNOSIS — G43809 Other migraine, not intractable, without status migrainosus: Secondary | ICD-10-CM

## 2019-07-20 DIAGNOSIS — I10 Essential (primary) hypertension: Secondary | ICD-10-CM

## 2019-07-20 MED ORDER — ATENOLOL 25 MG PO TABS: ORAL_TABLET | ORAL | 1 refills | Status: DC

## 2019-08-10 DIAGNOSIS — D229 Melanocytic nevi, unspecified: Secondary | ICD-10-CM | POA: Diagnosis not present

## 2019-08-10 DIAGNOSIS — D1801 Hemangioma of skin and subcutaneous tissue: Secondary | ICD-10-CM | POA: Diagnosis not present

## 2019-08-10 DIAGNOSIS — L819 Disorder of pigmentation, unspecified: Secondary | ICD-10-CM | POA: Diagnosis not present

## 2019-08-10 DIAGNOSIS — L814 Other melanin hyperpigmentation: Secondary | ICD-10-CM | POA: Diagnosis not present

## 2019-08-23 ENCOUNTER — Encounter: Payer: Self-pay | Admitting: Physician Assistant

## 2019-09-22 ENCOUNTER — Other Ambulatory Visit: Payer: Self-pay | Admitting: Physician Assistant

## 2019-11-15 ENCOUNTER — Other Ambulatory Visit: Payer: Self-pay | Admitting: Obstetrics & Gynecology

## 2019-11-15 DIAGNOSIS — Z1231 Encounter for screening mammogram for malignant neoplasm of breast: Secondary | ICD-10-CM

## 2019-11-16 ENCOUNTER — Other Ambulatory Visit: Payer: Self-pay

## 2019-11-16 ENCOUNTER — Ambulatory Visit
Admission: RE | Admit: 2019-11-16 | Discharge: 2019-11-16 | Disposition: A | Discharge status: Home / Self Care | Payer: BC Managed Care – PPO | Source: Ambulatory Visit

## 2019-11-16 DIAGNOSIS — Z1231 Encounter for screening mammogram for malignant neoplasm of breast: Secondary | ICD-10-CM | POA: Diagnosis not present

## 2019-12-23 ENCOUNTER — Other Ambulatory Visit: Payer: Self-pay | Admitting: Physician Assistant

## 2019-12-23 ENCOUNTER — Other Ambulatory Visit: Payer: Self-pay | Admitting: Internal Medicine

## 2020-01-04 DIAGNOSIS — Z01419 Encounter for gynecological examination (general) (routine) without abnormal findings: Secondary | ICD-10-CM | POA: Diagnosis not present

## 2020-01-04 DIAGNOSIS — Z6831 Body mass index (BMI) 31.0-31.9, adult: Secondary | ICD-10-CM | POA: Diagnosis not present

## 2020-01-25 ENCOUNTER — Encounter: Payer: Self-pay | Admitting: Gastroenterology

## 2020-03-08 DIAGNOSIS — L918 Other hypertrophic disorders of the skin: Secondary | ICD-10-CM | POA: Diagnosis not present

## 2020-03-08 DIAGNOSIS — N9089 Other specified noninflammatory disorders of vulva and perineum: Secondary | ICD-10-CM | POA: Diagnosis not present

## 2020-03-27 DIAGNOSIS — M62562 Muscle wasting and atrophy, not elsewhere classified, left lower leg: Secondary | ICD-10-CM | POA: Diagnosis not present

## 2020-03-27 DIAGNOSIS — M25571 Pain in right ankle and joints of right foot: Secondary | ICD-10-CM | POA: Diagnosis not present

## 2020-03-27 DIAGNOSIS — R269 Unspecified abnormalities of gait and mobility: Secondary | ICD-10-CM | POA: Diagnosis not present

## 2020-03-27 DIAGNOSIS — M25572 Pain in left ankle and joints of left foot: Secondary | ICD-10-CM | POA: Diagnosis not present

## 2020-03-28 DIAGNOSIS — M25572 Pain in left ankle and joints of left foot: Secondary | ICD-10-CM | POA: Diagnosis not present

## 2020-03-28 DIAGNOSIS — M62562 Muscle wasting and atrophy, not elsewhere classified, left lower leg: Secondary | ICD-10-CM | POA: Diagnosis not present

## 2020-03-28 DIAGNOSIS — R269 Unspecified abnormalities of gait and mobility: Secondary | ICD-10-CM | POA: Diagnosis not present

## 2020-03-28 DIAGNOSIS — M25571 Pain in right ankle and joints of right foot: Secondary | ICD-10-CM | POA: Diagnosis not present

## 2020-03-30 DIAGNOSIS — M25571 Pain in right ankle and joints of right foot: Secondary | ICD-10-CM | POA: Diagnosis not present

## 2020-03-30 DIAGNOSIS — M62562 Muscle wasting and atrophy, not elsewhere classified, left lower leg: Secondary | ICD-10-CM | POA: Diagnosis not present

## 2020-03-30 DIAGNOSIS — R269 Unspecified abnormalities of gait and mobility: Secondary | ICD-10-CM | POA: Diagnosis not present

## 2020-03-30 DIAGNOSIS — M25572 Pain in left ankle and joints of left foot: Secondary | ICD-10-CM | POA: Diagnosis not present

## 2020-04-02 DIAGNOSIS — R269 Unspecified abnormalities of gait and mobility: Secondary | ICD-10-CM | POA: Diagnosis not present

## 2020-04-02 DIAGNOSIS — M25572 Pain in left ankle and joints of left foot: Secondary | ICD-10-CM | POA: Diagnosis not present

## 2020-04-02 DIAGNOSIS — M25571 Pain in right ankle and joints of right foot: Secondary | ICD-10-CM | POA: Diagnosis not present

## 2020-04-02 DIAGNOSIS — M62562 Muscle wasting and atrophy, not elsewhere classified, left lower leg: Secondary | ICD-10-CM | POA: Diagnosis not present

## 2020-04-06 DIAGNOSIS — M62562 Muscle wasting and atrophy, not elsewhere classified, left lower leg: Secondary | ICD-10-CM | POA: Diagnosis not present

## 2020-04-06 DIAGNOSIS — M25572 Pain in left ankle and joints of left foot: Secondary | ICD-10-CM | POA: Diagnosis not present

## 2020-04-06 DIAGNOSIS — R269 Unspecified abnormalities of gait and mobility: Secondary | ICD-10-CM | POA: Diagnosis not present

## 2020-04-06 DIAGNOSIS — M25571 Pain in right ankle and joints of right foot: Secondary | ICD-10-CM | POA: Diagnosis not present

## 2020-04-11 DIAGNOSIS — R269 Unspecified abnormalities of gait and mobility: Secondary | ICD-10-CM | POA: Diagnosis not present

## 2020-04-11 DIAGNOSIS — M62562 Muscle wasting and atrophy, not elsewhere classified, left lower leg: Secondary | ICD-10-CM | POA: Diagnosis not present

## 2020-04-11 DIAGNOSIS — M25572 Pain in left ankle and joints of left foot: Secondary | ICD-10-CM | POA: Diagnosis not present

## 2020-04-11 DIAGNOSIS — M25571 Pain in right ankle and joints of right foot: Secondary | ICD-10-CM | POA: Diagnosis not present

## 2020-04-12 ENCOUNTER — Ambulatory Visit (AMBULATORY_SURGERY_CENTER): Payer: Self-pay | Admitting: *Deleted

## 2020-04-12 ENCOUNTER — Other Ambulatory Visit: Payer: Self-pay

## 2020-04-12 VITALS — Ht 67.0 in | Wt 188.0 lb

## 2020-04-12 DIAGNOSIS — Z1211 Encounter for screening for malignant neoplasm of colon: Secondary | ICD-10-CM

## 2020-04-12 NOTE — Progress Notes (Signed)

## 2020-04-23 DIAGNOSIS — M25572 Pain in left ankle and joints of left foot: Secondary | ICD-10-CM | POA: Diagnosis not present

## 2020-04-23 DIAGNOSIS — R269 Unspecified abnormalities of gait and mobility: Secondary | ICD-10-CM | POA: Diagnosis not present

## 2020-04-23 DIAGNOSIS — M62562 Muscle wasting and atrophy, not elsewhere classified, left lower leg: Secondary | ICD-10-CM | POA: Diagnosis not present

## 2020-04-23 DIAGNOSIS — M25571 Pain in right ankle and joints of right foot: Secondary | ICD-10-CM | POA: Diagnosis not present

## 2020-04-24 ENCOUNTER — Telehealth: Payer: Self-pay | Admitting: Gastroenterology

## 2020-04-24 NOTE — Telephone Encounter (Signed)
Pt told to push fluids until procedure; understanding voiced

## 2020-04-24 NOTE — Telephone Encounter (Signed)
Patient called said she ate nuts yesterday please advise

## 2020-04-27 ENCOUNTER — Ambulatory Visit (AMBULATORY_SURGERY_CENTER): Payer: BC Managed Care – PPO | Admitting: Gastroenterology

## 2020-04-27 ENCOUNTER — Other Ambulatory Visit: Payer: Self-pay

## 2020-04-27 ENCOUNTER — Encounter: Payer: Self-pay | Admitting: Gastroenterology

## 2020-04-27 VITALS — BP 148/79 | HR 48 | Temp 97.1°F | Resp 18 | Ht 67.0 in | Wt 188.0 lb

## 2020-04-27 DIAGNOSIS — D122 Benign neoplasm of ascending colon: Secondary | ICD-10-CM | POA: Diagnosis not present

## 2020-04-27 DIAGNOSIS — Z1211 Encounter for screening for malignant neoplasm of colon: Secondary | ICD-10-CM | POA: Diagnosis not present

## 2020-04-27 DIAGNOSIS — D125 Benign neoplasm of sigmoid colon: Secondary | ICD-10-CM | POA: Diagnosis not present

## 2020-04-27 MED ORDER — SODIUM CHLORIDE 0.9 % IV SOLN: 500.0000 mL | Freq: Once | INTRAVENOUS | Status: DC

## 2020-04-27 NOTE — Progress Notes (Signed)
Pt's states no medical or surgical changes since previsit or office visit.  VS CW  

## 2020-04-27 NOTE — Patient Instructions (Addendum)
Read all of the handouts given to you by your recovery room nurse.  Thank-you for choosing [place] for your healthcare needs today.  YOU HAD AN ENDOSCOPIC PROCEDURE TODAY AT [facility]:   Refer to the procedure report that was given to you for any specific questions about what was found during the examination.  If the procedure report does not answer your questions, please call your gastroenterologist to clarify.  If you requested that your care partner not be given the details of your procedure findings, then the procedure report has been included in a sealed envelope for you to review at your convenience later.  YOU SHOULD EXPECT: Some feelings of bloating in the abdomen. Passage of more gas than usual.  Walking can help get rid of the air that was put into your GI tract during the procedure and reduce the bloating. If you had a lower endoscopy (such as a colonoscopy or flexible sigmoidoscopy) you may notice spotting of blood in your stool or on the toilet paper. If you underwent a bowel prep for your procedure, you may not have a normal bowel movement for a few days.  Please Note:  You might notice some irritation and congestion in your nose or some drainage.  This is from the oxygen used during your procedure.  There is no need for concern and it should clear up in a day or so.  SYMPTOMS TO REPORT IMMEDIATELY:   Following lower endoscopy (colonoscopy or flexible sigmoidoscopy):  Excessive amounts of blood in the stool  Significant tenderness or worsening of abdominal pains  Swelling of the abdomen that is new, acute  Fever of 100F or higher   For urgent or emergent issues, a gastroenterologist can be reached at any hour by calling [phone]. Do not use MyChart messaging for urgent concerns.    DIET:  We do recommend a small meal at first, but then you may proceed to your regular diet.  Drink plenty of fluids but you should avoid alcoholic beverages for 24 hours. Try to  eat a high fiber diet, and drink plenty of water.  Use FiberCon 1-2 tablets daily.  ACTIVITY:  You should plan to take it easy for the rest of today and you should NOT DRIVE or use heavy machinery until tomorrow (because of the sedation medicines used during the test).    FOLLOW UP: Our staff will call the number listed on your records 48-72 hours following your procedure to check on you and address any questions or concerns that you may have regarding the information given to you following your procedure. If we do not reach you, we will leave a message.  We will attempt to reach you two times.  During this call, we will ask if you have developed any symptoms of COVID 19. If you develop any symptoms (ie: fever, flu-like symptoms, shortness of breath, cough etc.) before then, please call [phone].  If you test positive for Covid 19 in the 2 weeks post procedure, please call and report this information to [place].    If any biopsies were taken you will be contacted by phone or by letter within the next 1-3 weeks.  Please call us at [phone] if you have not heard about the biopsies in 3 weeks.    SIGNATURES/CONFIDENTIALITY: You and/or your care partner have signed paperwork which will be entered into your electronic medical record.  These signatures attest to the fact that that the information above on your After  Visit Summary has been reviewed and is understood.  Full responsibility of the confidentiality of this discharge information lies with you and/or your care-partner. 

## 2020-04-27 NOTE — Op Note (Signed)
[facility] Patient Name: Natasha Fritz Procedure Date: 04/27/2020 7:32 AM MRN: [id number] Endoscopist: Justice Britain , MD Age: 59 Referring MD:  Date of Birth: 1960-12-28 Gender: Female Account #: [id number] Procedure:                Colonoscopy Indications:              Screening for colorectal malignant neoplasm Medicines:                Monitored Anesthesia Care Procedure:                Pre-Anesthesia Assessment:                           - Prior to the procedure, a History and Physical                            was performed, and patient medications and                            allergies were reviewed. The patient's tolerance of                            previous anesthesia was also reviewed. The risks                            and benefits of the procedure and the sedation                            options and risks were discussed with the patient.                            All questions were answered, and informed consent                            was obtained. Prior Anticoagulants: The patient has                            taken no previous anticoagulant or antiplatelet                            agents except for aspirin. ASA Grade Assessment: II                            - A patient with mild systemic disease. After                            reviewing the risks and benefits, the patient was                            deemed in satisfactory condition to undergo the                            procedure.  After obtaining informed consent, the colonoscope                            was passed under direct vision. Throughout the                            procedure, the patient's blood pressure, pulse, and                            oxygen saturations were monitored continuously. The                            Colonoscope was introduced through the anus and                            advanced to the 5 cm into the ileum. The                             colonoscopy was performed without difficulty. The                            patient tolerated the procedure. The quality of the                            bowel preparation was good. The terminal ileum,                            ileocecal valve, appendiceal orifice, and rectum                            were photographed. Scope In: 8:12:03 AM Scope Out: 8:30:41 AM Scope Withdrawal Time: 0 hours 13 minutes 20 seconds  Total Procedure Duration: 0 hours 18 minutes 38 seconds  Findings:                 The digital rectal exam findings include                            hemorrhoids. Pertinent negatives include no                            palpable rectal lesions.                           The terminal ileum and ileocecal valve appeared                            normal.                           Four sessile polyps were found in the sigmoid colon                            (1) and ascending colon (3). The polyps were 2 to 4  mm in size. These polyps were removed with a cold                            snare. Resection and retrieval were complete.                           Normal mucosa was found in the entire colon                            otherwise.                           Non-bleeding non-thrombosed external and internal                            hemorrhoids were found during retroflexion, during                            perianal exam and during digital exam. The                            hemorrhoids were Grade II (internal hemorrhoids                            that prolapse but reduce spontaneously). Complications:            No immediate complications. Estimated Blood Loss:     Estimated blood loss was minimal. Impression:               - Hemorrhoids found on digital rectal exam.                           - The examined portion of the ileum was normal.                           - Four 2 to 4 mm polyps in the sigmoid colon and in                             the ascending colon, removed with a cold snare.                            Resected and retrieved.                           - Normal mucosa in the entire examined colon                            otherwise.                           - Non-bleeding non-thrombosed external and internal                            hemorrhoids. Recommendation:           - The patient will be observed post-procedure,  until all discharge criteria are met.                           - Discharge patient to home.                           - Patient has a contact number available for                            emergencies. The signs and symptoms of potential                            delayed complications were discussed with the                            patient. Return to normal activities tomorrow.                            Written discharge instructions were provided to the                            patient.                           - High fiber diet.                           - Use FiberCon 1-2 tablets PO daily.                           - Continue present medications.                           - Await pathology results.                           - Repeat colonoscopy in 3 - 5 years for                            surveillance based on pathology results.                           - The findings and recommendations were discussed                            with the patient. Justice Britain, MD 04/27/2020 8:42:16 AM

## 2020-04-27 NOTE — Progress Notes (Signed)
A and O x3. Report to RN. Tolerated MAC anesthesia well.

## 2020-04-27 NOTE — Progress Notes (Signed)
Called to room to assist during endoscopic procedure.  Patient ID and intended procedure confirmed with present staff. Received instructions for my participation in the procedure from the performing physician.  

## 2020-05-01 ENCOUNTER — Telehealth: Payer: Self-pay

## 2020-05-01 NOTE — Telephone Encounter (Signed)
  Follow up Call-  Call back number 04/27/2020  Post procedure Call Back phone  # [phone]  Permission to leave phone message Yes  Some recent data might be hidden     Patient questions:  Do you have a fever, pain , or abdominal swelling? No. Pain Score  0 *  Have you tolerated food without any problems? Yes.    Have you been able to return to your normal activities? Yes.    Do you have any questions about your discharge instructions: Diet   No. Medications  No. Follow up visit  No.  Do you have questions or concerns about your Care? No.  Actions: * If pain score is 4 or above: No action needed, pain <4.  1. Have you developed a fever since your procedure? no  2.   Have you had an respiratory symptoms (SOB or cough) since your procedure? no 3.   Have you tested positive for COVID 19 since your procedure no  4.   Have you had any family members/close contacts diagnosed with the COVID 19 since your procedure?  no   If yes to any of these questions please route to Joylene John, RN and Erenest Rasher, RN

## 2020-05-02 DIAGNOSIS — R269 Unspecified abnormalities of gait and mobility: Secondary | ICD-10-CM | POA: Diagnosis not present

## 2020-05-02 DIAGNOSIS — M25572 Pain in left ankle and joints of left foot: Secondary | ICD-10-CM | POA: Diagnosis not present

## 2020-05-02 DIAGNOSIS — M25571 Pain in right ankle and joints of right foot: Secondary | ICD-10-CM | POA: Diagnosis not present

## 2020-05-02 DIAGNOSIS — M62562 Muscle wasting and atrophy, not elsewhere classified, left lower leg: Secondary | ICD-10-CM | POA: Diagnosis not present

## 2020-05-09 DIAGNOSIS — R269 Unspecified abnormalities of gait and mobility: Secondary | ICD-10-CM | POA: Diagnosis not present

## 2020-05-09 DIAGNOSIS — M25571 Pain in right ankle and joints of right foot: Secondary | ICD-10-CM | POA: Diagnosis not present

## 2020-05-09 DIAGNOSIS — M25572 Pain in left ankle and joints of left foot: Secondary | ICD-10-CM | POA: Diagnosis not present

## 2020-05-09 DIAGNOSIS — M62562 Muscle wasting and atrophy, not elsewhere classified, left lower leg: Secondary | ICD-10-CM | POA: Diagnosis not present

## 2020-05-13 ENCOUNTER — Encounter: Payer: Self-pay | Admitting: Gastroenterology

## 2020-05-23 DIAGNOSIS — M62562 Muscle wasting and atrophy, not elsewhere classified, left lower leg: Secondary | ICD-10-CM | POA: Diagnosis not present

## 2020-05-23 DIAGNOSIS — M25571 Pain in right ankle and joints of right foot: Secondary | ICD-10-CM | POA: Diagnosis not present

## 2020-05-23 DIAGNOSIS — M25572 Pain in left ankle and joints of left foot: Secondary | ICD-10-CM | POA: Diagnosis not present

## 2020-05-23 DIAGNOSIS — R269 Unspecified abnormalities of gait and mobility: Secondary | ICD-10-CM | POA: Diagnosis not present

## 2020-06-27 NOTE — Progress Notes (Signed)
Assessment and Plan:  Natasha Fritz was seen today for mass.  Diagnoses and all orders for this visit:  Moderate episode of recurrent major depressive disorder ([place]) Anxiety Grief Long discussion regarding counseling services to support medication treatment Discussed stress management techniques   Discussed good sleep hygiene Discussed increasing physical activity and exercise Increase water intake -     escitalopram (LEXAPRO) 10 MG tablet; Take 1 tablet (10 mg total) by mouth daily. Discussed medication and side effects Patient to contact office with any new or worsening symptoms Follow up 4-6 weeks.  Essential hypertension Continue current medications: atenolol 25mg  daily, olmesartan 40mg  daily Monitor blood pressure at home; call if consistently over 130/80 Continue DASH diet.   Reminder to go to the ER if any CP, SOB, nausea, dizziness, severe HA, changes vision/speech, left arm numbness and tingling and jaw pain.  Other migraine without status migrainosus, not intractable Improved since retirement Not on preventive medication  Achilles tendinitis of both lower extremities Discussed medication and stretches, icing. -     meloxicam (MOBIC) 15 MG tablet; Take 1 tablet (15 mg total) by mouth daily as needed for pain.   Further disposition pending results of labs. Discussed med's effects and SE's.   Over 30 minutes of face to face interview, exam, counseling, chart review, and critical decision making was performed.   Future Appointments  Date Time Provider [place]  06/28/2020  8:45 AM Garnet Sierras, NP [facility] None  07/09/2020  9:00 AM Liane Comber, NP [facility] None    ------------------------------------------------------------------------------------------------------------------   HPI 59 y.o.female presents for evaluation of lump to the right side of her clavicle.  She noticed this yesterday  She reports that the area is not red or tender.  No open  areas.  She was concerned and wanted to have it checked.  She had COVID-19 booster on 06/26/20.  She reports that she has had a large increase in stress in her life.  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  She reports she has two brothers one lives out of state and the sibling local has not been able to help.  She reports that she has been working from home and that has also caused an increase in stress.  She reports it is hard for her to conentrate on her work and get things accomplished. She reports that she takes alprazolam 1/4 a tablet as needed but has had to use this more frequently to help her manage her stress.  She has tried WellbutrinXL150mg  in the past.  She reports she had side effects and it made her feel worse.  She did try this medication on two different occasions.  She has considered counseling which she tried to set up through Hospice but there was some miscommunication and she did not follow up.  She is tearful with conversation today.  She denies an SI/HI.    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 In winter for colds (Patient not taking: Reported on 04/27/2020)   aspirin 81 MG EC tablet Take 81 mg by mouth daily.     atenolol (TENORMIN) 25 MG tablet Take 1 to 2 tablets at Bedtime for BP & Migraine  Prophylaxis   buPROPion (WELLBUTRIN XL) 150 MG 24 hr tablet TAKE 1 TABLET BY MOUTH EVERY MORNING (Patient not taking: Reported on 04/12/2020)   cholecalciferol (VITAMIN D) 1000 UNITS tablet Take 1,000 Units by mouth daily.   fluocinonide cream (LIDEX) 6.29 % Apply 1 application topically 2 (two) times daily. (Patient not taking: Reported on 04/12/2020)   fluticasone (CUTIVATE) 0.05 % cream Apply 1 application topically 2 (two) times daily. (Patient not taking: Reported on 04/12/2020)   fluticasone (FLONASE) 50 MCG/ACT nasal spray Place 2 sprays into both nostrils at bedtime. (Patient not taking: Reported on 04/12/2020)   ibuprofen (ADVIL,MOTRIN) 200 MG tablet Take 3 tablets (600 mg total) by mouth every 6 (six) hours as needed. For pain   ketoconazole (NIZORAL) 2 % cream Apply 1 application topically daily. (Patient not taking: Reported on 04/12/2020)   meloxicam (MOBIC) 15 MG tablet TAKE HALF TO ONE TABLET BY MOUTH EVERY DAY WITH FOOD FOR PAIN OR inflammation. (Patient not taking: Reported on 04/12/2020)   MINIVELLE 0.05 MG/24HR patch APPLY 1 PATCH TO SKIN TWICE A WEEK (Patient not taking: Reported on 04/12/2020)   Multiple Vitamin (MULTIVITAMIN) tablet Take 1 tablet by mouth daily.   naratriptan (AMERGE) 2.5 MG tablet Take 1 tablet (2.5 mg total) by mouth as needed. Do not take more then 5mg s in 24 hrs. Use w/3 20 mg prednisone (Patient not taking: Reported on 04/27/2020)   olmesartan (BENICAR) 40 MG tablet Take 1 tablet Daily for BP - need office visit before refill   Omega-3 Fatty Acids (FISH OIL) 1200 MG CAPS Take 1 capsule by mouth daily.   omeprazole (PRILOSEC) 20 MG capsule Take 1 capsule (20 mg total) by mouth 2 (two) times daily. (Patient not taking: Reported on 04/27/2020)   valACYclovir (VALTREX) 500 MG tablet TAKE ONE TABLET BY MOUTH EVERY DAY   No current facility-administered medications on file prior to visit.    ROS: all negative except above.   Physical Exam:  LMP  09/05/2012   General Appearance: Well nourished, in no apparent distress. Eyes: PERRLA, EOMs, conjunctiva no swelling or erythema Sinuses: No Frontal/maxillary tenderness ENT/Mouth: Ext aud canals clear, TMs without erythema, bulging. No erythema, swelling, or exudate on post pharynx.  Tonsils not swollen or erythematous. Hearing normal.  Neck: Supple, thyroid normal.  Respiratory: Respiratory effort normal, BS equal bilaterally without rales, rhonchi, wheezing or stridor.  Cardio: RRR with no MRGs. Brisk peripheral pulses without edema.  Abdomen: Soft, + BS.  Non tender, no guarding, rebound, hernias, masses. Lymphatics: Non tender without lymphadenopathy. Lymphadenopathy noted right subclavicular. Musculoskeletal: Full ROM, 5/5 strength, normal gait. Edema noted, bilateral achilles.  No redness or open areas noted. Skin: Warm, dry without rashes, lesions, ecchymosis.  Neuro: Cranial nerves intact. Normal muscle tone, no cerebellar symptoms. Sensation intact.  Psych: Awake and oriented X 3, normal affect, Insight and Judgment appropriate.     Garnet Sierras, NP 1:58 PM [facility]

## 2020-06-28 ENCOUNTER — Ambulatory Visit (INDEPENDENT_AMBULATORY_CARE_PROVIDER_SITE_OTHER): Payer: BC Managed Care – PPO | Admitting: Adult Health Nurse Practitioner

## 2020-06-28 ENCOUNTER — Encounter: Payer: Self-pay | Admitting: Adult Health Nurse Practitioner

## 2020-06-28 ENCOUNTER — Other Ambulatory Visit: Payer: Self-pay

## 2020-06-28 VITALS — BP 150/102 | HR 77 | Temp 97.5°F | Wt 193.0 lb

## 2020-06-28 DIAGNOSIS — F419 Anxiety disorder, unspecified: Secondary | ICD-10-CM | POA: Diagnosis not present

## 2020-06-28 DIAGNOSIS — F331 Major depressive disorder, recurrent, moderate: Secondary | ICD-10-CM | POA: Diagnosis not present

## 2020-06-28 DIAGNOSIS — I10 Essential (primary) hypertension: Secondary | ICD-10-CM | POA: Diagnosis not present

## 2020-06-28 DIAGNOSIS — F4321 Adjustment disorder with depressed mood: Secondary | ICD-10-CM

## 2020-06-28 DIAGNOSIS — G43809 Other migraine, not intractable, without status migrainosus: Secondary | ICD-10-CM | POA: Diagnosis not present

## 2020-06-28 DIAGNOSIS — M7661 Achilles tendinitis, right leg: Secondary | ICD-10-CM

## 2020-06-28 DIAGNOSIS — M7662 Achilles tendinitis, left leg: Secondary | ICD-10-CM

## 2020-06-28 MED ORDER — MELOXICAM 15 MG PO TABS: 15.0000 mg | ORAL_TABLET | Freq: Every day | ORAL | 1 refills | Status: DC | PRN

## 2020-06-28 MED ORDER — ESCITALOPRAM OXALATE 10 MG PO TABS: 10.0000 mg | ORAL_TABLET | Freq: Every day | ORAL | 1 refills | Status: DC

## 2020-06-28 NOTE — Patient Instructions (Addendum)
We sent in Escitalopram (Lexapro) take one tablet every night.  Follow up in 4-6 weeks for medication check.    We will contact you via MyChart with your lab results.  You can check to see if there an Employee Assistance program through your parents employer.  Better Help is an on-line resource that you might be able to initiate on your own.  It is telephone / video visits https://www.betterhelp.com    Locally:  [facility] [address] [place], [place] [address]  [phone] Ext [phone]   [facility] [address], [place], [place] [address]  Ph [phone]

## 2020-07-05 ENCOUNTER — Encounter: Payer: Self-pay | Admitting: Adult Health

## 2020-07-05 DIAGNOSIS — Z85828 Personal history of other malignant neoplasm of skin: Secondary | ICD-10-CM | POA: Insufficient documentation

## 2020-07-05 NOTE — Progress Notes (Signed)
Complete Physical  Assessment and Plan:  Encounter for general adult medical examination with abnormal findings 1 year  Essential hypertension - atypically elevated in office, attributes to stress, however above goal on home logs as well -  increase benicar to 40mg  and continue atenolol once at night for BP/HA, DASH diet, exercise and monitor at home. Recheck in 2 weeks. Consider adding diuretic  Call if greater than 130/80.  - CBC with Differential/Platelet - CMP/GFR - TSH - Urinalysis, Routine w reflex microscopic (not at [facility]) - Microalbumin / creatinine urine ratio - EKG 12-Lead   Hyperlipidemia -continue medications, check lipids, decrease fatty foods, increase activity.  - Lipid panel  Prediabetes Discussed general issues about diabetes pathophysiology and management., Educational material distributed., Suggested low cholesterol diet., Encouraged aerobic exercise., Discussed foot care., Reminded to get yearly retinal exam. - Hemoglobin A1c  Medication management - Magnesium  Vitamin D deficiency - Vit D  25 hydroxy (rtn osteoporosis monitoring)  DDD (degenerative disc disease), cervical Chronic pain, suggest PT, mobic PRN, ortho  Hx of basal cell carcinoma of nose Derm following annually  Other migraine without status migrainosus, not intractable Recently improved; monitor  Obesity - BMI 30  Long discussion about weight loss, diet, and exercise Recommended diet heavy in fruits and veggies and low in animal meats, cheeses, and dairy products, appropriate calorie intake Patient will work on portions, add exercise - try stationary cycle if tolerated  Discussed appropriate weight for height and initial goal <160 lb  Follow up at next visit  Snoring Denies other concerning sx of OSA; likely r/t obesity After discussion will work on weight this year and evaluate progress Referral for sleep study if not improving  Anxiety Newly on lexapro with percieved  improvement; follow up if needed Stress management techniques discussed, increase water, good sleep hygiene discussed, increase exercise, and increase veggies.   Bilateral achilles tendonitis Persistent 1+ year, doing antiinflammatories, did PT and doing exercises Requests podiatry referral today which was placed  Need for influenza vaccine Quadrivalent flu vaccine administered without complication today    Discussed med's effects and SE's. Screening labs and tests as requested with regular follow-up as recommended. Over 40 minutes of exam, counseling, chart review, and complex, high level critical decision making was performed this visit.  Future Appointments  Date Time Provider [place]  07/23/2020 11:00 AM [facility] NURSE [facility] None  01/11/2021  9:00 AM Liane Comber, NP [facility] None  07/10/2021 10:00 AM Liane Comber, NP [facility] None    HPI  59 y.o. female  presents for a complete physical. She has Hyperlipidemia; Essential hypertension; Other abnormal glucose; Medication management; Vitamin D deficiency; DDD (degenerative disc disease), cervical; Obesity (BMI 30.0-34.9); Anxiety; History of basal cell carcinoma (BCC); History of adenomatous polyp of colon; Achilles tendinitis of both lower extremities; Snoring; and Acid reflux on their problem list.  She is married, 3 grown kids, 2 daughters and a son, 3 grandchildren in [place].   She works for [employer] in Engineer, technical sales.   Follows Dr. Benjie Karvonen at [facility] annually. Hx of hysterectomy sparing ovaries.   She follows with derm annually, Dr. Ledell Peoples office but he retired, seeing new provider, derm due to history of BCC to nose   Her mom had lung cancer and passed away in 12-16-18.  The patient was recently started on lexapro due to anxiety sx, reports has noted improvement already, has xanax but hasn't used since starting med.   She reports new snoring in the last year, without apenic  episodes, does endorse chronic  fatigue for several years but correlates with mom's health and stressful job. Denies AM headache. She has history of migraines, recently improved.   BMI is Body mass index is 30.74 kg/m., she has been working on diet, she was walking daily but recently with bilateral bilateral achilles tendonitis, has seen ortho/PT, currently managing with meloxicam, doing massage and exercises. She has had some snoring in the last year, no other concerning sx, wants to work on weight with her husband, may do nutrisystem.  Wt Readings from Last 3 Encounters:  07/09/20 189 lb (85.7 kg)  06/28/20 193 lb (87.5 kg)  04/27/20 188 lb (85.3 kg)   Her blood pressure has not been controlled at home due to stress (130-160/80-100), she is normally on benicar 20mg  and atenolol 25 once a day, will occ take benicar 40mg  and atenolol 25, today their BP is BP: (!) 170/100 She does not workout. She denies chest pain, shortness of breath, dizziness.   She is not on cholesterol medication and denies myalgias. Her cholesterol is not at goal. The cholesterol last visit was:   Lab Results  Component Value Date   CHOL 207 (H) 02/16/2019   HDL 38 (L) 02/16/2019   LDLCALC 137 (H) 02/16/2019   TRIG 184 (H) 02/16/2019   CHOLHDL 5.4 (H) 02/16/2019   She has been working on diet and exercise for prediabetes, and denies paresthesia of the feet, polydipsia, polyuria and visual disturbances. Last A1C in the office was:  Lab Results  Component Value Date   HGBA1C 5.7 (H) 02/16/2019   Patient is on Vitamin D supplement.   Lab Results  Component Value Date   VD25OH 44 02/16/2019       Current Medications:  Current Outpatient Medications on File Prior to Visit  Medication Sig Dispense Refill  . ALPRAZolam (XANAX) 0.5 MG tablet TAKE 1 TABLET BY MOUTH 2 TIMES DAILY AS NEEDED 60 tablet 0  . aspirin 81 MG EC tablet Take 81 mg by mouth daily.      [name] atenolol (TENORMIN) 25 MG tablet Take 1 to 2 tablets at Bedtime for BP & Migraine  Prophylaxis 180 tablet 1  . cholecalciferol (VITAMIN D) 1000 UNITS tablet Take 1,000 Units by mouth daily.    [name] escitalopram (LEXAPRO) 10 MG tablet Take 1 tablet (10 mg total) by mouth daily. 30 tablet 1  . famotidine (PEPCID) 10 MG tablet Take 10 mg by mouth 2 (two) times daily as needed.    . fluocinonide cream (LIDEX) 6.07 % Apply 1 application topically 2 (two) times daily.     . fluticasone (FLONASE) 50 MCG/ACT nasal spray Place 2 sprays into both nostrils at bedtime. 16 g 1  . ketoconazole (NIZORAL) 2 % cream Apply 1 application topically daily.     . meloxicam (MOBIC) 15 MG tablet Take 1 tablet (15 mg total) by mouth daily as needed for pain. 30 tablet 1  . Multiple Vitamin (MULTIVITAMIN) tablet Take 1 tablet by mouth daily.    [name] olmesartan (BENICAR) 40 MG tablet Take 1 tablet Daily for BP - need office visit before refill 90 tablet 0  . Omega-3 Fatty Acids (FISH OIL) 1200 MG CAPS Take 1 capsule by mouth daily.    . valACYclovir (VALTREX) 500 MG tablet TAKE ONE TABLET BY MOUTH EVERY DAY (Patient taking differently: as needed. ) 90 tablet 2   No current facility-administered medications on file prior to visit.   Health Maintenance:   Immunization History  Administered Date(s) Administered  . Influenza-Unspecified 06/22/2013, 06/22/2017, 07/12/2018  . PFIZER SARS-COV-2 Vaccination 10/12/2019, 11/02/2019, 06/26/2020  . Tdap 09/22/2006, 08/10/2017  . Zoster Recombinat (Shingrix) 06/22/2017, 11/04/2017   Tetanus: 2018 Pneumovax: N/A Prevnar 13: N/A Flu vaccine: 2019 at work, TODAY  Shingrix: 2/2 Covid 19: 3/3, 2021, pfizer  LMP: 2013, TAH Pap: April 2019 Dr. Benjie Karvonen, going annually  MGM: 10/2019 DEXA: N/A  Colonoscopy: 04/2020, [place], adenomatous polyp, due 3 years EGD: N/A  Last Dental Exam: Dr. [place], last 2021 Last Eye Exam: Dr. Hassell Done, wears glasses, annually, last 2021  Patient Care Team: Unk Pinto, MD as PCP - General (Internal Medicine) Elsie Saas, MD as  Consulting Physician (Orthopedic Surgery) Monna Fam, MD as Consulting Physician (Ophthalmology) Fay Records, MD as Consulting Physician (Cardiology) Inda Castle, MD (Inactive) as Consulting Physician (Gastroenterology) Azucena Fallen, MD as Consulting Physician (Obstetrics and Gynecology) Leeroy Cha, MD as Consulting Physician (Neurosurgery) Druscilla Brownie, MD as Consulting Physician (Dermatology)  Medical History:  Past Medical History:  Diagnosis Date  . Achilles tendinitis   . Acid reflux 07/09/2020  . Anxiety   . Basal cell carcinoma of nose 05/23/2015  . 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 HSV-1 infection   . HTN (hypertension)   . Hyperlipidemia   . Migraine   . PONV (postoperative nausea and vomiting)    Allergies Allergies  Allergen Reactions  . Penicillins     REACTION: hives    SURGICAL HISTORY She  has a past surgical history that includes Tubal ligation (1994); acl replacement; Cervical disc surgery (2012); Robotic assisted total hysterectomy (09/10/2012); Bilateral salpingectomy (09/10/2012); De Quervian's release (Bilateral, 2019); and Colonoscopy (2011). FAMILY HISTORY Her family history includes Alzheimer's disease in her father; Breast cancer in her mother; Cancer in her maternal grandfather; Diabetes in her brother; Heart disease in her brother; Hyperlipidemia in her brother; Hypertension in her brother, brother, and mother; Lung cancer in her mother; Stroke in her mother. SOCIAL HISTORY She  reports that she is a non-smoker but has been exposed to tobacco smoke. She has never used smokeless tobacco. She reports current alcohol use. She reports that she does not use drugs.  Review of Systems:  Review of Systems  Constitutional: Negative.  Negative for malaise/fatigue and weight loss.  HENT: Negative for congestion, ear discharge, ear pain, hearing loss,  nosebleeds, sore throat and tinnitus.   Eyes: Negative.  Negative for blurred vision and double vision.  Respiratory: Negative.  Negative for cough, shortness of breath, wheezing and stridor.   Cardiovascular: Negative.  Negative for chest pain, palpitations, orthopnea, claudication and leg swelling.  Gastrointestinal: Negative.  Negative for abdominal pain, blood in stool, constipation, diarrhea, heartburn, melena, nausea and vomiting.  Genitourinary: Negative.   Musculoskeletal: Negative for back pain, falls, joint pain, myalgias and neck pain.       Bil achilles pain/tenderness   Skin: Negative.  Negative for rash.  Neurological: Negative for dizziness, tingling, tremors, sensory change, speech change, focal weakness, seizures, loss of consciousness, weakness and headaches.  Endo/Heme/Allergies: Negative.  Negative for polydipsia.  Psychiatric/Behavioral: Negative for depression, hallucinations, memory loss, substance abuse and suicidal ideas. The patient is nervous/anxious (improving on med). The patient does not have insomnia.   All other systems reviewed and are negative.   Physical Exam: Estimated body mass index is 30.74 kg/m as calculated from the following:   Height as of this encounter: 5' 5.75" (1.67 m).   Weight  as of this encounter: 189 lb (85.7 kg). BP (!) 170/100   Pulse 61   Temp (!) 97.5 F (36.4 C)   Ht 5' 5.75" (1.67 m)   Wt 189 lb (85.7 kg)   LMP 09/05/2012   SpO2 97%   BMI 30.74 kg/m   Wt Readings from Last 3 Encounters:  07/09/20 189 lb (85.7 kg)  06/28/20 193 lb (87.5 kg)  04/27/20 188 lb (85.3 kg)   General Appearance: Well nourished, in no apparent distress.  Eyes: PERRLA, EOMs, conjunctiva no swelling or erythema Sinuses: No Frontal/maxillary tenderness  ENT/Mouth: Ext aud canals clear, normal light reflex with TMs without erythema, bulging. Good dentition. No erythema, swelling, or exudate on post pharynx. Tonsils not swollen or erythematous. Hearing  normal.  Neck: Supple, thyroid normal. No bruits  Respiratory: Respiratory effort normal, BS equal bilaterally without rales, rhonchi, wheezing or stridor.  Cardio: RRR without murmurs, rubs or gallops. Brisk peripheral pulses without edema.  Chest: symmetric, with normal excursions and percussion.  Breasts: defer to GYN Abdomen: Soft, nontender, no guarding, rebound, hernias, masses, or organomegaly.  Lymphatics: Non tender without lymphadenopathy.  Genitourinary: defer to GYN Musculoskeletal: Full ROM all peripheral extremities,5/5 strength, and mildly antalgic gait. She has bilateral achilles tendon tenderness and mild swelling/boggy without erythema or heat.  Skin: Warm, dry without rashes, lesions, ecchymosis. Neuro: Cranial nerves intact, reflexes equal bilaterally. Normal muscle tone, no cerebellar symptoms. Sensation intact.  Psych: Awake and oriented X 3, normal affect, Insight and Judgment appropriate.   EKG: Sinus brady, borderline 1st degree AV block, no ST changes  Gorden Harms Kaybree Williams 1:48 PM [facility]

## 2020-07-09 ENCOUNTER — Encounter: Payer: Self-pay | Admitting: Adult Health

## 2020-07-09 ENCOUNTER — Ambulatory Visit (INDEPENDENT_AMBULATORY_CARE_PROVIDER_SITE_OTHER): Payer: BC Managed Care – PPO | Admitting: Adult Health

## 2020-07-09 ENCOUNTER — Other Ambulatory Visit: Payer: Self-pay

## 2020-07-09 VITALS — BP 170/100 | HR 61 | Temp 97.5°F | Ht 65.75 in | Wt 189.0 lb

## 2020-07-09 DIAGNOSIS — F419 Anxiety disorder, unspecified: Secondary | ICD-10-CM

## 2020-07-09 DIAGNOSIS — E669 Obesity, unspecified: Secondary | ICD-10-CM

## 2020-07-09 DIAGNOSIS — R7309 Other abnormal glucose: Secondary | ICD-10-CM

## 2020-07-09 DIAGNOSIS — M7662 Achilles tendinitis, left leg: Secondary | ICD-10-CM | POA: Insufficient documentation

## 2020-07-09 DIAGNOSIS — Z86010 Personal history of colonic polyps: Secondary | ICD-10-CM | POA: Insufficient documentation

## 2020-07-09 DIAGNOSIS — Z1329 Encounter for screening for other suspected endocrine disorder: Secondary | ICD-10-CM

## 2020-07-09 DIAGNOSIS — I10 Essential (primary) hypertension: Secondary | ICD-10-CM | POA: Diagnosis not present

## 2020-07-09 DIAGNOSIS — Z79899 Other long term (current) drug therapy: Secondary | ICD-10-CM | POA: Diagnosis not present

## 2020-07-09 DIAGNOSIS — Z0000 Encounter for general adult medical examination without abnormal findings: Secondary | ICD-10-CM | POA: Diagnosis not present

## 2020-07-09 DIAGNOSIS — Z0001 Encounter for general adult medical examination with abnormal findings: Secondary | ICD-10-CM

## 2020-07-09 DIAGNOSIS — Z23 Encounter for immunization: Secondary | ICD-10-CM | POA: Diagnosis not present

## 2020-07-09 DIAGNOSIS — K219 Gastro-esophageal reflux disease without esophagitis: Secondary | ICD-10-CM

## 2020-07-09 DIAGNOSIS — Z131 Encounter for screening for diabetes mellitus: Secondary | ICD-10-CM

## 2020-07-09 DIAGNOSIS — Z136 Encounter for screening for cardiovascular disorders: Secondary | ICD-10-CM

## 2020-07-09 DIAGNOSIS — Z1389 Encounter for screening for other disorder: Secondary | ICD-10-CM | POA: Diagnosis not present

## 2020-07-09 DIAGNOSIS — Z13220 Encounter for screening for lipoid disorders: Secondary | ICD-10-CM | POA: Diagnosis not present

## 2020-07-09 DIAGNOSIS — E559 Vitamin D deficiency, unspecified: Secondary | ICD-10-CM | POA: Diagnosis not present

## 2020-07-09 DIAGNOSIS — R0683 Snoring: Secondary | ICD-10-CM | POA: Insufficient documentation

## 2020-07-09 DIAGNOSIS — M5030 Other cervical disc degeneration, unspecified cervical region: Secondary | ICD-10-CM

## 2020-07-09 DIAGNOSIS — E663 Overweight: Secondary | ICD-10-CM

## 2020-07-09 DIAGNOSIS — Z130 Encounter for screening for diseases of the blood and blood-forming organs and certain disorders involving the immune mechanism: Secondary | ICD-10-CM

## 2020-07-09 DIAGNOSIS — D649 Anemia, unspecified: Secondary | ICD-10-CM

## 2020-07-09 DIAGNOSIS — Z85828 Personal history of other malignant neoplasm of skin: Secondary | ICD-10-CM

## 2020-07-09 DIAGNOSIS — M7661 Achilles tendinitis, right leg: Secondary | ICD-10-CM

## 2020-07-09 DIAGNOSIS — C44311 Basal cell carcinoma of skin of nose: Secondary | ICD-10-CM

## 2020-07-09 DIAGNOSIS — E785 Hyperlipidemia, unspecified: Secondary | ICD-10-CM

## 2020-07-09 HISTORY — DX: Gastro-esophageal reflux disease without esophagitis: K21.9

## 2020-07-09 NOTE — Patient Instructions (Addendum)
Ms. Natasha Fritz , Thank you for taking time to come for your Annual Wellness Visit. I appreciate your ongoing commitment to your health goals. Please review the following plan we discussed and let me know if I can assist you in the future.   These are the goals we discussed: Goals    .  Blood Pressure < 130/80    .  LDL CALC < 130    .  Weight (lb) < 160 lb (72.6 kg) (pt-stated)       This is a list of the screening recommended for you and due dates:  Health Maintenance  Topic Date Due  . Flu Shot  04/22/2020  . Pap Smear  01/01/2021  . Mammogram  11/15/2021  . Colon Cancer Screening  04/28/2023  . Tetanus Vaccine  08/11/2027  . COVID-19 Vaccine  Completed  .  Hepatitis C: One time screening is recommended by Center for Disease Control  (CDC) for  adults born from [birth year] through 1966.   Completed  . HIV Screening  Completed    Increase famotidine to twice daily for 4 weeks then try resuming once daily Alternately try adding nexium or protonix in the evening, famotidine in AM for 4 weeks the resume famotidine - follow up if not improving     Know what a healthy weight is for you (roughly BMI <25) and aim to maintain this  Aim for 7+ servings of fruits and vegetables daily  65-80+ fluid ounces of water or unsweet tea for healthy kidneys  Limit to max 1 drink of alcohol per day; avoid smoking/tobacco  Limit animal fats in diet for cholesterol and heart health - choose grass fed whenever available  Avoid highly processed foods, and foods high in saturated/trans fats  Aim for low stress - take time to unwind and care for your mental health  Aim for 150 min of moderate intensity exercise weekly for heart health, and weights twice weekly for bone health  Aim for 7-9 hours of sleep daily   HYPERTENSION INFORMATION  Monitor your blood pressure at home, please keep a record and bring that in with you to your next office visit.   Go to the ER if any CP, SOB, nausea, dizziness,  severe HA, changes vision/speech  Testing/Procedures: HOW TO TAKE YOUR BLOOD PRESSURE:  Rest 5 minutes before taking your blood pressure.  Don't smoke or drink caffeinated beverages for at least 30 minutes before.  Take your blood pressure before (not after) you eat.  Sit comfortably with your back supported and both feet on the floor (don't cross your legs).  Elevate your arm to heart level on a table or a desk.  Use the proper sized cuff. It should fit smoothly and snugly around your bare upper arm. There should be enough room to slip a fingertip under the cuff. The bottom edge of the cuff should be 1 inch above the crease of the elbow.  Due to a recent study, SPRINT, we have changed our goal for the systolic or top blood pressure number. Ideally we want your top number at 120.  In the [facility] Trial, 5000 people were randomized to a goal BP of 120 and 5000 people were randomized to a goal BP of less than 140. The patients with the goal BP at 120 had LESS DEMENTIA, LESS HEART ATTACKS, AND LESS STROKES, AS WELL AS OVERALL DECREASED MORTALITY OR DEATH RATE.   There was another study that showed taking your blood pressure medications at night  decrease cardiovascular events.  However if you are on a fluid pill, please take this in the morning.   If you are willing, our goal BP is the top number of 120.  Your most recent BP: BP: (!) 170/100   Take your medications faithfully as instructed. Maintain a healthy weight. Get at least 150 minutes of aerobic exercise per week. Minimize salt intake. Minimize alcohol intake  DASH Eating Plan DASH stands for "Dietary Approaches to Stop Hypertension." The DASH eating plan is a healthy eating plan that has been shown to reduce high blood pressure (hypertension). Additional health benefits may include reducing the risk of type 2 diabetes mellitus, heart disease, and stroke. The DASH eating plan may also help with weight loss. WHAT DO I NEED TO KNOW  ABOUT THE DASH EATING PLAN? For the DASH eating plan, you will follow these general guidelines:  Choose foods with a percent daily value for sodium of less than 5% (as listed on the food label).  Use salt-free seasonings or herbs instead of table salt or sea salt.  Check with your health care provider or pharmacist before using salt substitutes.  Eat lower-sodium products, often labeled as "lower sodium" or "no salt added."  Eat fresh foods.  Eat more vegetables, fruits, and low-fat dairy products.  Choose whole grains. Look for the word "whole" as the first word in the ingredient list.  Choose fish and skinless chicken or [place] more often than red meat. Limit fish, poultry, and meat to 6 oz (170 g) each day.  Limit sweets, desserts, sugars, and sugary drinks.  Choose heart-healthy fats.  Limit cheese to 1 oz (28 g) per day.  Eat more home-cooked food and less restaurant, buffet, and fast food.  Limit fried foods.  Cook foods using methods other than frying.  Limit canned vegetables. If you do use them, rinse them well to decrease the sodium.  When eating at a restaurant, ask that your food be prepared with less salt, or no salt if possible. WHAT FOODS CAN I EAT? Seek help from a dietitian for individual calorie needs. Grains Whole grain or whole wheat bread. Brown rice. Whole grain or whole wheat pasta. Quinoa, bulgur, and whole grain cereals. Low-sodium cereals. Corn or whole wheat flour tortillas. Whole grain cornbread. Whole grain crackers. Low-sodium crackers. Vegetables Fresh or frozen vegetables (raw, steamed, roasted, or grilled). Low-sodium or reduced-sodium tomato and vegetable juices. Low-sodium or reduced-sodium tomato sauce and paste. Low-sodium or reduced-sodium canned vegetables.  Fruits All fresh, canned (in natural juice), or frozen fruits. Meat and Other Protein Products Ground beef (85% or leaner), grass-fed beef, or beef trimmed of fat. Skinless chicken  or [place]. Ground chicken or [place]. Pork trimmed of fat. All fish and seafood. Eggs. Dried beans, peas, or lentils. Unsalted nuts and seeds. Unsalted canned beans. Dairy Low-fat dairy products, such as skim or 1% milk, 2% or reduced-fat cheeses, low-fat ricotta or cottage cheese, or plain low-fat yogurt. Low-sodium or reduced-sodium cheeses. Fats and Oils Tub margarines without trans fats. Light or reduced-fat mayonnaise and salad dressings (reduced sodium). Avocado. Safflower, olive, or canola oils. Natural peanut or almond butter. Other Unsalted popcorn and pretzels. The items listed above may not be a complete list of recommended foods or beverages. Contact your dietitian for more options. WHAT FOODS ARE NOT RECOMMENDED? Grains White bread. White pasta. White rice. Refined cornbread. Bagels and croissants. Crackers that contain trans fat. Vegetables Creamed or fried vegetables. Vegetables in a cheese sauce. Regular canned vegetables.  Regular canned tomato sauce and paste. Regular tomato and vegetable juices. Fruits Dried fruits. Canned fruit in light or heavy syrup. Fruit juice. Meat and Other Protein Products Fatty cuts of meat. Ribs, chicken wings, bacon, sausage, bologna, salami, chitterlings, fatback, hot dogs, bratwurst, and packaged luncheon meats. Salted nuts and seeds. Canned beans with salt. Dairy Whole or 2% milk, cream, half-and-half, and cream cheese. Whole-fat or sweetened yogurt. Full-fat cheeses or blue cheese. Nondairy creamers and whipped toppings. Processed cheese, cheese spreads, or cheese curds. Condiments Onion and garlic salt, seasoned salt, table salt, and sea salt. Canned and packaged gravies. Worcestershire sauce. Tartar sauce. Barbecue sauce. Teriyaki sauce. Soy sauce, including reduced sodium. Steak sauce. Fish sauce. Oyster sauce. Cocktail sauce. Horseradish. Ketchup and mustard. Meat flavorings and tenderizers. Bouillon cubes. Hot sauce. Tabasco sauce. Marinades.  Taco seasonings. Relishes. Fats and Oils Butter, stick margarine, lard, shortening, ghee, and bacon fat. Coconut, palm kernel, or palm oils. Regular salad dressings. Other Pickles and olives. Salted popcorn and pretzels. The items listed above may not be a complete list of foods and beverages to avoid. Contact your dietitian for more information. WHERE CAN I FIND MORE INFORMATION? National Heart, Lung, and Blood Institute: travelstabloid.com Document Released: 08/28/2011 Document Revised: 01/23/2014 Document Reviewed: 07/13/2013 [facility] Patient Information 2015 [place], [place]. This information is not intended to replace advice given to you by your health care provider. Make sure you discuss any questions you have with your health care provider.         High-Fiber Diet Fiber, also called dietary fiber, is a type of carbohydrate that is found in fruits, vegetables, whole grains, and beans. A high-fiber diet can have many health benefits. Your health care provider may recommend a high-fiber diet to help:  Prevent constipation. Fiber can make your bowel movements more regular.  Lower your cholesterol.  Relieve the following conditions: ? Swelling of veins in the anus (hemorrhoids). ? Swelling and irritation (inflammation) of specific areas of the digestive tract (uncomplicated diverticulosis). ? A problem of the large intestine (colon) that sometimes causes pain and diarrhea (irritable bowel syndrome, IBS).  Prevent overeating as part of a weight-loss plan.  Prevent heart disease, type 2 diabetes, and certain cancers. What is my plan? The recommended daily fiber intake in grams (g) includes:  38 g for men age 36 or younger.  30 g for men over age 72.  57 g for women age 15 or younger.  21 g for women over age 64. You can get the recommended daily intake of dietary fiber by:  Eating a variety of fruits, vegetables, grains, and  beans.  Taking a fiber supplement, if it is not possible to get enough fiber through your diet. What do I need to know about a high-fiber diet?  It is better to get fiber through food sources rather than from fiber supplements. There is not a lot of research about how effective supplements are.  Always check the fiber content on the nutrition facts label of any prepackaged food. Look for foods that contain 5 g of fiber or more per serving.  Talk with a diet and nutrition specialist (dietitian) if you have questions about specific foods that are recommended or not recommended for your medical condition, especially if those foods are not listed below.  Gradually increase how much fiber you consume. If you increase your intake of dietary fiber too quickly, you may have bloating, cramping, or gas.  Drink plenty of water. Water helps you to digest fiber.  What are tips for following this plan?  Eat a wide variety of high-fiber foods.  Make sure that half of the grains that you eat each day are whole grains.  Eat breads and cereals that are made with whole-grain flour instead of refined flour or white flour.  Eat brown rice, bulgur wheat, or millet instead of white rice.  Start the day with a breakfast that is high in fiber, such as a cereal that contains 5 g of fiber or more per serving.  Use beans in place of meat in soups, salads, and pasta dishes.  Eat high-fiber snacks, such as berries, raw vegetables, nuts, and popcorn.  Choose whole fruits and vegetables instead of processed forms like juice or sauce. What foods can I eat?  Fruits Berries. Pears. Apples. Oranges. Avocado. Prunes and raisins. Dried figs. Vegetables Sweet potatoes. Spinach. Kale. Artichokes. Cabbage. Broccoli. Cauliflower. Green peas. Carrots. Squash. Grains Whole-grain breads. Multigrain cereal. Oats and oatmeal. Brown rice. Barley. Bulgur wheat. [place]. Quinoa. Bran muffins. Popcorn. Rye wafer crackers. Meats  and other proteins [place], kidney, and pinto beans. Soybeans. Split peas. Lentils. Nuts and seeds. Dairy Fiber-fortified yogurt. Beverages Fiber-fortified soy milk. Fiber-fortified orange juice. Other foods Fiber bars. The items listed above may not be a complete list of recommended foods and beverages. Contact a dietitian for more options. What foods are not recommended? Fruits Fruit juice. Cooked, strained fruit. Vegetables Fried potatoes. Canned vegetables. Well-cooked vegetables. Grains White bread. Pasta made with refined flour. White rice. Meats and other proteins Fatty cuts of meat. Fried chicken or fried fish. Dairy Milk. Yogurt. Cream cheese. Sour cream. Fats and oils Butters. Beverages Soft drinks. Other foods Cakes and pastries. The items listed above may not be a complete list of foods and beverages to avoid. Contact a dietitian for more information. Summary  Fiber is a type of carbohydrate. It is found in fruits, vegetables, whole grains, and beans.  There are many health benefits of eating a high-fiber diet, such as preventing constipation, lowering blood cholesterol, helping with weight loss, and reducing your risk of heart disease, diabetes, and certain cancers.  Gradually increase your intake of fiber. Increasing too fast can result in cramping, bloating, and gas. Drink plenty of water while you increase your fiber.  The best sources of fiber include whole fruits and vegetables, whole grains, nuts, seeds, and beans. This information is not intended to replace advice given to you by your health care provider. Make sure you discuss any questions you have with your health care provider. Document Revised: 07/13/2017 Document Reviewed: 07/13/2017 Elsevier Patient Education  2020 [employer].

## 2020-07-10 ENCOUNTER — Other Ambulatory Visit: Payer: Self-pay | Admitting: Adult Health

## 2020-07-10 LAB — CBC WITH DIFFERENTIAL/PLATELET
Absolute Monocytes: 422 cells/uL (ref 200–950)
Basophils Absolute: 48 cells/uL (ref 0–200)
Basophils Relative: 0.7 %
Eosinophils Absolute: 122 cells/uL (ref 15–500)
Eosinophils Relative: 1.8 %
HCT: 40.4 % (ref 35.0–45.0)
Hemoglobin: 13.8 g/dL (ref 11.7–15.5)
Lymphs Abs: 1428 cells/uL (ref 850–3900)
MCH: 30.3 pg (ref 27.0–33.0)
MCHC: 34.2 g/dL (ref 32.0–36.0)
MCV: 88.6 fL (ref 80.0–100.0)
MPV: 11.7 fL (ref 7.5–12.5)
Monocytes Relative: 6.2 %
Neutro Abs: 4780 cells/uL (ref 1500–7800)
Neutrophils Relative %: 70.3 %
Platelets: 285 10*3/uL (ref 140–400)
RBC: 4.56 10*6/uL (ref 3.80–5.10)
RDW: 12.5 % (ref 11.0–15.0)
Total Lymphocyte: 21 %
WBC: 6.8 10*3/uL (ref 3.8–10.8)

## 2020-07-10 LAB — COMPLETE METABOLIC PANEL WITH GFR
AG Ratio: 1.6 (calc) (ref 1.0–2.5)
ALT: 16 U/L (ref 6–29)
AST: 18 U/L (ref 10–35)
Albumin: 4.5 g/dL (ref 3.6–5.1)
Alkaline phosphatase (APISO): 87 U/L (ref 37–153)
BUN: 20 mg/dL (ref 7–25)
CO2: 27 mmol/L (ref 20–32)
Calcium: 9.6 mg/dL (ref 8.6–10.4)
Chloride: 104 mmol/L (ref 98–110)
Creat: 0.72 mg/dL (ref 0.50–0.99)
GFR, Est African American: 105 mL/min/{1.73_m2} (ref >=60)
GFR, Est Non African American: 91 mL/min/{1.73_m2} (ref >=60)
Globulin: 2.8 g/dL (calc) (ref 1.9–3.7)
Glucose, Bld: 106 mg/dL — ABNORMAL HIGH (ref 65–99)
Potassium: 4.2 mmol/L (ref 3.5–5.3)
Sodium: 140 mmol/L (ref 135–146)
Total Bilirubin: 0.4 mg/dL (ref 0.2–1.2)
Total Protein: 7.3 g/dL (ref 6.1–8.1)

## 2020-07-10 LAB — LIPID PANEL
Cholesterol: 207 mg/dL — ABNORMAL HIGH (ref <200)
HDL: 39 mg/dL — ABNORMAL LOW (ref >=50)
LDL Cholesterol (Calc): 141 mg/dL (calc) — ABNORMAL HIGH
Non-HDL Cholesterol (Calc): 168 mg/dL (calc) — ABNORMAL HIGH (ref <130)
Total CHOL/HDL Ratio: 5.3 (calc) — ABNORMAL HIGH (ref <5.0)
Triglycerides: 148 mg/dL (ref <150)

## 2020-07-10 LAB — URINALYSIS, ROUTINE W REFLEX MICROSCOPIC
Bilirubin Urine: NEGATIVE
Glucose, UA: NEGATIVE
Hgb urine dipstick: NEGATIVE
Ketones, ur: NEGATIVE
Leukocytes,Ua: NEGATIVE
Nitrite: NEGATIVE
Protein, ur: NEGATIVE
Specific Gravity, Urine: 1.021 (ref 1.001–1.03)
pH: 5.5 (ref 5.0–8.0)

## 2020-07-10 LAB — HEMOGLOBIN A1C
Hgb A1c MFr Bld: 5.7 % of total Hgb — ABNORMAL HIGH (ref <5.7)
Mean Plasma Glucose: 117 (calc)
eAG (mmol/L): 6.5 (calc)

## 2020-07-10 LAB — MICROALBUMIN / CREATININE URINE RATIO
Creatinine, Urine: 124 mg/dL (ref 20–275)
Microalb Creat Ratio: 32 mcg/mg creat — ABNORMAL HIGH (ref <30)
Microalb, Ur: 4 mg/dL

## 2020-07-10 LAB — MAGNESIUM: Magnesium: 2.2 mg/dL (ref 1.5–2.5)

## 2020-07-10 LAB — VITAMIN B12: Vitamin B-12: 665 pg/mL (ref 200–1100)

## 2020-07-10 LAB — TSH: TSH: 1.95 mIU/L (ref 0.40–4.50)

## 2020-07-10 LAB — VITAMIN D 25 HYDROXY (VIT D DEFICIENCY, FRACTURES): Vit D, 25-Hydroxy: 41 ng/mL (ref 30–100)

## 2020-07-10 MED ORDER — PRAVASTATIN SODIUM 20 MG PO TABS: 20.0000 mg | ORAL_TABLET | Freq: Every evening | ORAL | 1 refills | Status: DC

## 2020-07-11 ENCOUNTER — Encounter: Payer: Self-pay | Admitting: Adult Health

## 2020-07-11 NOTE — Addendum Note (Signed)
Addended by: Chancy Hurter on: 07/11/2020 01:50 PM   Modules accepted: Orders

## 2020-07-19 ENCOUNTER — Other Ambulatory Visit: Payer: Self-pay

## 2020-07-19 ENCOUNTER — Ambulatory Visit (INDEPENDENT_AMBULATORY_CARE_PROVIDER_SITE_OTHER): Payer: BC Managed Care – PPO | Admitting: Podiatry

## 2020-07-19 ENCOUNTER — Encounter: Payer: Self-pay | Admitting: Podiatry

## 2020-07-19 ENCOUNTER — Ambulatory Visit (INDEPENDENT_AMBULATORY_CARE_PROVIDER_SITE_OTHER): Payer: BC Managed Care – PPO

## 2020-07-19 DIAGNOSIS — S86019A Strain of unspecified Achilles tendon, initial encounter: Secondary | ICD-10-CM

## 2020-07-19 DIAGNOSIS — M7662 Achilles tendinitis, left leg: Secondary | ICD-10-CM | POA: Diagnosis not present

## 2020-07-19 DIAGNOSIS — M7661 Achilles tendinitis, right leg: Secondary | ICD-10-CM | POA: Diagnosis not present

## 2020-07-19 MED ORDER — METHYLPREDNISOLONE 4 MG PO TBPK: ORAL_TABLET | ORAL | 0 refills | Status: DC

## 2020-07-19 MED ORDER — MELOXICAM 15 MG PO TABS: 15.0000 mg | ORAL_TABLET | Freq: Every day | ORAL | 3 refills | Status: DC

## 2020-07-19 MED ORDER — NITROGLYCERIN 0.2 MG/HR TD PT24: 0.2000 mg | MEDICATED_PATCH | Freq: Every day | TRANSDERMAL | 12 refills | Status: DC

## 2020-07-21 NOTE — Progress Notes (Signed)
Subjective:  Patient ID: Natasha Fritz, female    DOB: 12-Aug-1961,  MRN: [id number] HPI Chief Complaint  Patient presents with  . Foot Pain    Achilles bilateral (R>L) - aching, swelling x 2 years, knots, PCP Rx'd meloxicam, had 2 rounds of PT, stretching at home, massaging, tried magnesium cream - not getting better  . New Patient (Initial Visit)    Est pt 65    59 y.o. female presents with the above complaint.   ROS: Denies fever chills nausea vomiting muscle aches pains calf pain back pain chest pain shortness of breath.  Past Medical History:  Diagnosis Date  . Achilles tendinitis   . Acid reflux 07/09/2020  . Anxiety   . Basal cell carcinoma of nose 05/23/2015  . 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 HSV-1 infection   . HTN (hypertension)   . Hyperlipidemia   . Migraine   . PONV (postoperative nausea and vomiting)    Past Surgical History:  Procedure Laterality Date  . acl replacement    . BILATERAL SALPINGECTOMY  09/10/2012   Procedure: BILATERAL SALPINGECTOMY;  Surgeon: Elveria Royals, MD;  Location: [facility];  Service: Gynecology;  Laterality: Bilateral;  . [place] SURGERY  2012   fusion, Dr. Joya Salm  . COLONOSCOPY  2011  . De Quervian's release Bilateral 2019   Dr. Burney Gauze   . ROBOTIC ASSISTED TOTAL HYSTERECTOMY  09/10/2012   Procedure: ROBOTIC ASSISTED TOTAL HYSTERECTOMY;  Surgeon: Elveria Royals, MD;  Location: [facility];  Service: Gynecology;  Laterality: N/A;  3 hrs.  . TUBAL LIGATION  1994    Current Outpatient Medications:  .  ALPRAZolam (XANAX) 0.5 MG tablet, TAKE 1 TABLET BY MOUTH 2 TIMES DAILY AS NEEDED, Disp: 60 tablet, Rfl: 0 .  aspirin 81 MG EC tablet, Take 81 mg by mouth daily.  , Disp: , Rfl:  .  atenolol (TENORMIN) 25 MG tablet, Take 1 to 2 tablets at Bedtime for BP & Migraine Prophylaxis, Disp: 180 tablet, Rfl: 1 .  cholecalciferol (VITAMIN D) 1000 UNITS  tablet, Take 1,000 Units by mouth daily., Disp: , Rfl:  .  escitalopram (LEXAPRO) 10 MG tablet, Take 1 tablet (10 mg total) by mouth daily., Disp: 30 tablet, Rfl: 1 .  famotidine (PEPCID) 10 MG tablet, Take 10 mg by mouth 2 (two) times daily as needed., Disp: , Rfl:  .  fluocinonide cream (LIDEX) 3.15 %, Apply 1 application topically 2 (two) times daily. , Disp: , Rfl:  .  fluticasone (FLONASE) 50 MCG/ACT nasal spray, Place 2 sprays into both nostrils at bedtime., Disp: 16 g, Rfl: 1 .  ketoconazole (NIZORAL) 2 % cream, Apply 1 application topically daily. , Disp: , Rfl:  .  meloxicam (MOBIC) 15 MG tablet, Take 1 tablet (15 mg total) by mouth daily., Disp: 30 tablet, Rfl: 3 .  methylPREDNISolone (MEDROL DOSEPAK) 4 MG TBPK tablet, 6 day dose pack - take as directed, Disp: 21 tablet, Rfl: 0 .  Multiple Vitamin (MULTIVITAMIN) tablet, Take 1 tablet by mouth daily., Disp: , Rfl:  .  nitroGLYCERIN (NITRO-DUR) 0.2 mg/hr patch, Place 1 patch (0.2 mg total) onto the skin daily., Disp: 30 patch, Rfl: 12 .  olmesartan (BENICAR) 40 MG tablet, Take 1 tablet Daily for BP - need office visit before refill, Disp: 90 tablet, Rfl: 0 .  Omega-3 Fatty Acids (FISH OIL) 1200 MG CAPS, Take 1 capsule by mouth daily., Disp: , Rfl:  .  pravastatin (PRAVACHOL) 20 MG tablet, Take 1 tablet (20 mg total) by mouth every evening., Disp: 90 tablet, Rfl: 1 .  valACYclovir (VALTREX) 500 MG tablet, TAKE ONE TABLET BY MOUTH EVERY DAY (Patient taking differently: as needed. ), Disp: 90 tablet, Rfl: 2  Allergies  Allergen Reactions  . Penicillins     REACTION: hives   Review of Systems Objective:  There were no vitals filed for this visit.  General: Well developed, nourished, in no acute distress, alert and oriented x3   Dermatological: Skin is warm, dry and supple bilateral. Nails x 10 are well maintained; remaining integument appears unremarkable at this time. There are no open sores, no preulcerative lesions, no rash or signs  of infection present.  Vascular: Dorsalis Pedis artery and Posterior Tibial artery pedal pulses are 2/4 bilateral with immedate capillary fill time. Pedal hair growth present. No varicosities and no lower extremity edema present bilateral.   Neruologic: Grossly intact via light touch bilateral. Vibratory intact via tuning fork bilateral. Protective threshold with Semmes Wienstein monofilament intact to all pedal sites bilateral. Patellar and Achilles deep tendon reflexes 2+ bilateral. No Babinski or clonus noted bilateral.   Musculoskeletal: No gross boney pedal deformities bilateral. No pain, crepitus, or limitation noted with foot and ankle range of motion bilateral. Muscular strength 5/5 in all groups tested bilateral.  She has pain on palpation of the Achilles bilaterally there is an obvious nodularity in the mid substance of the Achilles admitted the length of the Achilles.  The right seems to be less so than the left does not appear to be fluctuant on palpation.  Gait: Unassisted, Nonantalgic.    Radiographs:  Radiographs taken today demonstrate an osseously mature individual with plantar distally oriented calcaneal heel spur on the right very small retrocalcaneal heel spurs.  Soft tissue margins are relatively normal with exception of the Achilles tendon.  There is an creased density and the middle portion of the Achilles extending from the myotendinous junction distally toward the calcaneal insertion.  I see no interruption of the Achilles at this point.  And there is no intratendinous calcification.  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  [place] tear.  Plan: Discussed etiology pathology conservative versus surgical therapies.  We dispensed a night splint to help maintain length and stretch on the Achilles.  We dispensed 1.  Once the worst foot gets some improvement she will transfer to the other foot.  I started her on  methylprednisolone to be followed by meloxicam and also started her on a 0.2 mg nitro patch bilaterally.  Should this cause headache she will decrease to 1 patch to the most painful foot.  And then if this causes headaches we will discontinue the use of the Nitropatch altogether.  I did encourage her to watch her blood pressure during this time.  I will follow-up with her in 1 month.     Davida Falconi T. [place], [place]

## 2020-07-23 ENCOUNTER — Ambulatory Visit (INDEPENDENT_AMBULATORY_CARE_PROVIDER_SITE_OTHER): Payer: BC Managed Care – PPO

## 2020-07-23 ENCOUNTER — Other Ambulatory Visit: Payer: Self-pay

## 2020-07-23 VITALS — BP 128/82 | Temp 97.6°F | Wt 187.0 lb

## 2020-07-23 DIAGNOSIS — Z0130 Encounter for examination of blood pressure without abnormal findings: Secondary | ICD-10-CM | POA: Diagnosis not present

## 2020-07-24 ENCOUNTER — Other Ambulatory Visit: Payer: Self-pay

## 2020-07-24 DIAGNOSIS — G43809 Other migraine, not intractable, without status migrainosus: Secondary | ICD-10-CM

## 2020-07-24 DIAGNOSIS — Z0130 Encounter for examination of blood pressure without abnormal findings: Secondary | ICD-10-CM | POA: Insufficient documentation

## 2020-07-24 DIAGNOSIS — I10 Essential (primary) hypertension: Secondary | ICD-10-CM

## 2020-07-24 MED ORDER — ATENOLOL 25 MG PO TABS: ORAL_TABLET | ORAL | 1 refills | Status: DC

## 2020-07-24 MED ORDER — OLMESARTAN MEDOXOMIL 40 MG PO TABS
ORAL_TABLET | ORAL | 0 refills | Status: DC
Start: 2020-07-24 — End: 2020-07-26

## 2020-07-24 NOTE — Progress Notes (Signed)
Will place bld pressure log on provider's desk.

## 2020-07-26 ENCOUNTER — Other Ambulatory Visit: Payer: Self-pay

## 2020-07-26 DIAGNOSIS — I10 Essential (primary) hypertension: Secondary | ICD-10-CM

## 2020-07-26 DIAGNOSIS — G43809 Other migraine, not intractable, without status migrainosus: Secondary | ICD-10-CM

## 2020-07-26 MED ORDER — ATENOLOL 25 MG PO TABS: ORAL_TABLET | ORAL | 1 refills | Status: DC

## 2020-07-26 MED ORDER — OLMESARTAN MEDOXOMIL 40 MG PO TABS
ORAL_TABLET | ORAL | 0 refills | Status: DC
Start: 2020-07-26 — End: 2021-03-25

## 2020-08-09 DIAGNOSIS — L814 Other melanin hyperpigmentation: Secondary | ICD-10-CM | POA: Diagnosis not present

## 2020-08-09 DIAGNOSIS — D1801 Hemangioma of skin and subcutaneous tissue: Secondary | ICD-10-CM | POA: Diagnosis not present

## 2020-08-09 DIAGNOSIS — L738 Other specified follicular disorders: Secondary | ICD-10-CM | POA: Diagnosis not present

## 2020-08-09 DIAGNOSIS — L819 Disorder of pigmentation, unspecified: Secondary | ICD-10-CM | POA: Diagnosis not present

## 2020-08-15 ENCOUNTER — Ambulatory Visit (INDEPENDENT_AMBULATORY_CARE_PROVIDER_SITE_OTHER): Payer: BC Managed Care – PPO | Admitting: Adult Health

## 2020-08-15 ENCOUNTER — Encounter: Payer: Self-pay | Admitting: Adult Health

## 2020-08-15 ENCOUNTER — Other Ambulatory Visit: Payer: Self-pay

## 2020-08-15 VITALS — BP 122/84 | HR 68 | Temp 98.1°F | Wt 185.0 lb

## 2020-08-15 DIAGNOSIS — Z1152 Encounter for screening for COVID-19: Secondary | ICD-10-CM | POA: Diagnosis not present

## 2020-08-15 DIAGNOSIS — J00 Acute nasopharyngitis [common cold]: Secondary | ICD-10-CM

## 2020-08-15 LAB — POC COVID19 BINAXNOW: SARS Coronavirus 2 Ag: NEGATIVE

## 2020-08-15 MED ORDER — PROMETHAZINE-DM 6.25-15 MG/5ML PO SYRP: 5.0000 mL | ORAL_SOLUTION | Freq: Four times a day (QID) | ORAL | 1 refills | Status: DC | PRN

## 2020-08-15 MED ORDER — PREDNISONE 20 MG PO TABS: ORAL_TABLET | ORAL | 0 refills | Status: DC

## 2020-08-15 NOTE — Patient Instructions (Signed)

## 2020-08-15 NOTE — Progress Notes (Signed)
Assessment and Plan:  Natasha Fritz was seen today for cough, nasal congestion, chills and headache.  Diagnoses and all orders for this visit:  Acute nasopharyngitis (common cold) Rapid covid 19 negative in fully vaccinated adult Fairly benign exam; suggestive of viral URI Discussed the importance of avoiding unnecessary antibiotic therapy. Expected duration of 5-7 days  Suggested symptomatic OTC remedies. Nasal saline spray for congestion. Nasal steroids, allergy pill, oral steroids offered Follow up as needed. -     predniSONE (DELTASONE) 20 MG tablet; 2 tablets daily for 3 days, 1 tablet daily for 4 days. -     promethazine-dextromethorphan (PROMETHAZINE-DM) 6.25-15 MG/5ML syrup; Take 5 mLs by mouth 4 (four) times daily as needed for cough.  Encounter for screening for COVID-19 -     POC COVID-19 BinaxNow   Further disposition pending results of labs. Discussed med's effects and SE's.   Over 20 minutes of exam, counseling, chart review, and critical decision making was performed.   Future Appointments  Date Time Provider [place]  08/21/2020  8:45 AM [place], [place] T, [place] [facility]  01/11/2021  9:00 AM Liane Comber, NP [facility] None  07/10/2021 10:00 AM Liane Comber, NP [facility] None    ------------------------------------------------------------------------------------------------------------------   HPI BP 122/84   Pulse 68   Temp 98.1 F (36.7 C)   Wt 185 lb (83.9 kg)   LMP 09/05/2012   SpO2 96%   BMI 30.09 kg/m   59 y.o.female presents for evaluation of URI.  She had negative rapid covid 19 screen today prior to evaluation.   She reports sx began 3-4 days ago, started feeling mildly congested and sore throat Sunday evening, woke up Monday feeling worse, with sinus headache, feeling lethargic, took frequent naps, started with mild cough but progressive over the last 2 days has been productive, feels like chest congestion. Denies dyspnea,  wheezing, CP. Endorses mild chills, but no measured temp. Denies changes in taste or smell. Mild sneezing. No watery itchy eyes.   Denies GI sx, myalgias, rash.   Started claritin and mucinex, does feel this helped congestion some. Pushing fluids and doing steam. Feeling slightly worse today despite this.   Never smoker, had 3/3 pfizer vaccine.   Denies hx of allergies, was around her grandchildren who had 2-3 days of mild URI sx that resolved spontaneously.   Past Medical History:  Diagnosis Date  . Achilles tendinitis   . Acid reflux 07/09/2020  . Anxiety   . Basal cell carcinoma of nose 05/23/2015  . 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 HSV-1 infection   . HTN (hypertension)   . Hyperlipidemia   . Migraine   . PONV (postoperative nausea and vomiting)      Allergies  Allergen Reactions  . Penicillins     REACTION: hives    Current Outpatient Medications on File Prior to Visit  Medication Sig  . ALPRAZolam (XANAX) 0.5 MG tablet TAKE 1 TABLET BY MOUTH 2 TIMES DAILY AS NEEDED  . aspirin 81 MG EC tablet Take 81 mg by mouth daily.    [name] atenolol (TENORMIN) 25 MG tablet Take 1 to 2 tablets at Bedtime for BP & Migraine Prophylaxis  . cholecalciferol (VITAMIN D) 1000 UNITS tablet Take 1,000 Units by mouth daily.  [name] escitalopram (LEXAPRO) 10 MG tablet Take 1 tablet (10 mg total) by mouth daily.  . famotidine (PEPCID) 10 MG tablet Take 10 mg by mouth 2 (two) times daily as needed.  [name]  fluocinonide cream (LIDEX) 3.15 % Apply 1 application topically 2 (two) times daily.   . fluticasone (FLONASE) 50 MCG/ACT nasal spray Place 2 sprays into both nostrils at bedtime.  [name] ketoconazole (NIZORAL) 2 % cream Apply 1 application topically daily.   . meloxicam (MOBIC) 15 MG tablet Take 1 tablet (15 mg total) by mouth daily.  . Multiple Vitamin (MULTIVITAMIN) tablet Take 1 tablet by mouth daily.  . nitroGLYCERIN  (NITRO-DUR) 0.2 mg/hr patch Place 1 patch (0.2 mg total) onto the skin daily.  [name] olmesartan (BENICAR) 40 MG tablet Take 1 tablet Daily for BP  . Omega-3 Fatty Acids (FISH OIL) 1200 MG CAPS Take 1 capsule by mouth daily.  . pravastatin (PRAVACHOL) 20 MG tablet Take 1 tablet (20 mg total) by mouth every evening.  . valACYclovir (VALTREX) 500 MG tablet TAKE ONE TABLET BY MOUTH EVERY DAY (Patient taking differently: as needed. )   No current facility-administered medications on file prior to visit.   Allergies:  Allergies  Allergen Reactions  . Penicillins     REACTION: hives   Social History:   reports that she is a non-smoker but has been exposed to tobacco smoke. She has never used smokeless tobacco. She reports current alcohol use. She reports that she does not use drugs.   ROS: all negative except above.   Physical Exam:  BP 122/84   Pulse 68   Temp 98.1 F (36.7 C)   Wt 185 lb (83.9 kg)   LMP 09/05/2012   SpO2 96%   BMI 30.09 kg/m   General Appearance: Well nourished, in no apparent distress. Eyes: PERRLA, EOMs, conjunctiva no swelling or erythema Sinuses: No Frontal/maxillary tenderness ENT/Mouth: Ext aud canals clear, TMs without erythema, bulging. No erythema, swelling, or exudate on post pharynx.  Tonsils not swollen or erythematous. Hearing normal.  Neck: Supple, thyroid normal.  Respiratory: Respiratory effort normal, BS equal bilaterally without rales, rhonchi, wheezing or stridor.  Cardio: RRR with no MRGs. Brisk peripheral pulses without edema.  Abdomen: Soft, + BS.  Non tender, no guarding, rebound, hernias, masses. Lymphatics: Non tender without lymphadenopathy.  Musculoskeletal: Full ROM, 5/5 strength, normal gait.  Skin: Warm, dry without rashes, lesions, ecchymosis.  Neuro: Cranial nerves intact. Normal muscle tone, no cerebellar symptoms. Sensation intact.  Psych: Awake and oriented X 3, normal affect, Insight and Judgment appropriate.     Izora Ribas, NP 11:45 AM [facility]

## 2020-08-21 ENCOUNTER — Ambulatory Visit: Payer: BC Managed Care – PPO | Admitting: Podiatry

## 2020-08-27 ENCOUNTER — Other Ambulatory Visit: Payer: Self-pay | Admitting: Adult Health Nurse Practitioner

## 2020-08-27 ENCOUNTER — Other Ambulatory Visit: Payer: Self-pay | Admitting: Adult Health

## 2020-08-27 DIAGNOSIS — F419 Anxiety disorder, unspecified: Secondary | ICD-10-CM

## 2020-08-30 ENCOUNTER — Encounter: Payer: Self-pay | Admitting: Podiatry

## 2020-08-30 ENCOUNTER — Other Ambulatory Visit: Payer: Self-pay | Admitting: Podiatry

## 2020-08-30 ENCOUNTER — Other Ambulatory Visit: Payer: Self-pay

## 2020-08-30 ENCOUNTER — Ambulatory Visit (INDEPENDENT_AMBULATORY_CARE_PROVIDER_SITE_OTHER): Payer: BC Managed Care – PPO | Admitting: Podiatry

## 2020-08-30 DIAGNOSIS — M7662 Achilles tendinitis, left leg: Secondary | ICD-10-CM

## 2020-08-30 DIAGNOSIS — M7661 Achilles tendinitis, right leg: Secondary | ICD-10-CM | POA: Diagnosis not present

## 2020-08-30 DIAGNOSIS — S86019A Strain of unspecified Achilles tendon, initial encounter: Secondary | ICD-10-CM

## 2020-08-30 MED ORDER — NITROGLYCERIN 0.2 MG/HR TD PT24
0.2000 mg | MEDICATED_PATCH | Freq: Every day | TRANSDERMAL | 12 refills | Status: DC
Start: 2020-08-30 — End: 2020-08-30

## 2020-08-30 MED FILL — NITROGLYCERIN 0.2 MG/HR PTC: 0.2 | 30 days supply | Qty: 30 | Fill #0

## 2020-08-30 NOTE — Progress Notes (Signed)
She presents today for follow-up of her Achilles tendinitis and Achilles tendon tears bilaterally.  She states that really did not any better nothing is improved she is tried wearing the boot at night splint utilizing her nitro patches nothing seems to be working for her.  She says if anything they seem to be worse.  Objective: Signs are stable alert oriented x3 pulses are palpable.  She has pain on palpation to the Achilles in the mid substance does demonstrate a nodular mass in the mid substance is exquisitely painful on palpation and on ambulation.  Assessment: Mid substance Achilles tendon tears bilaterally.  Plan: At this point we will request an MRI but also request that she continue the patches.  We will request an MRI of the bilateral foot to evaluate for tears of the Achilles tendons.  This is diagnostic as well as surgical consideration.

## 2020-09-06 ENCOUNTER — Other Ambulatory Visit: Payer: Self-pay | Admitting: Internal Medicine

## 2020-09-06 MED ORDER — VALACYCLOVIR HCL 500 MG PO TABS: ORAL_TABLET | ORAL | 0 refills | Status: DC

## 2020-09-09 DIAGNOSIS — W19XXXA Unspecified fall, initial encounter: Secondary | ICD-10-CM | POA: Diagnosis not present

## 2020-09-09 DIAGNOSIS — Z7982 Long term (current) use of aspirin: Secondary | ICD-10-CM | POA: Diagnosis not present

## 2020-09-09 DIAGNOSIS — Y92512 Supermarket, store or market as the place of occurrence of the external cause: Secondary | ICD-10-CM | POA: Diagnosis not present

## 2020-09-09 DIAGNOSIS — S0990XA Unspecified injury of head, initial encounter: Secondary | ICD-10-CM | POA: Diagnosis not present

## 2020-09-09 DIAGNOSIS — F10929 Alcohol use, unspecified with intoxication, unspecified: Secondary | ICD-10-CM | POA: Diagnosis not present

## 2020-09-09 DIAGNOSIS — R52 Pain, unspecified: Secondary | ICD-10-CM | POA: Diagnosis not present

## 2020-09-09 DIAGNOSIS — S0001XA Abrasion of scalp, initial encounter: Secondary | ICD-10-CM | POA: Diagnosis not present

## 2020-09-09 DIAGNOSIS — R110 Nausea: Secondary | ICD-10-CM | POA: Diagnosis not present

## 2020-09-12 ENCOUNTER — Other Ambulatory Visit: Payer: Self-pay

## 2020-09-12 ENCOUNTER — Ambulatory Visit
Admission: RE | Admit: 2020-09-12 | Discharge: 2020-09-12 | Disposition: A | Discharge status: Home / Self Care | Payer: BC Managed Care – PPO | Source: Ambulatory Visit | Attending: Podiatry | Admitting: Podiatry

## 2020-09-12 ENCOUNTER — Telehealth: Payer: Self-pay | Admitting: *Deleted

## 2020-09-12 DIAGNOSIS — M71571 Other bursitis, not elsewhere classified, right ankle and foot: Secondary | ICD-10-CM | POA: Diagnosis not present

## 2020-09-12 DIAGNOSIS — M7662 Achilles tendinitis, left leg: Secondary | ICD-10-CM | POA: Diagnosis not present

## 2020-09-12 DIAGNOSIS — S86019A Strain of unspecified Achilles tendon, initial encounter: Secondary | ICD-10-CM

## 2020-09-12 DIAGNOSIS — S86011A Strain of right Achilles tendon, initial encounter: Secondary | ICD-10-CM | POA: Diagnosis not present

## 2020-09-12 DIAGNOSIS — R600 Localized edema: Secondary | ICD-10-CM | POA: Diagnosis not present

## 2020-09-12 DIAGNOSIS — M722 Plantar fascial fibromatosis: Secondary | ICD-10-CM

## 2020-09-12 DIAGNOSIS — S99912A Unspecified injury of left ankle, initial encounter: Secondary | ICD-10-CM | POA: Diagnosis not present

## 2020-09-12 NOTE — Telephone Encounter (Signed)
Called patient -   Informed her of results. Let her know a scheduler would be calling to set her up for a consult appointment.

## 2020-09-12 NOTE — Telephone Encounter (Signed)
Called patient-   Informed her of results of MRI - Dr. Milinda Pointer advised she would benefit from PT. I will send over a referral to [facility] and they will contact her for an appt.

## 2020-09-12 NOTE — Telephone Encounter (Signed)
-----   Message from Garrel Ridgel, [place] sent at 09/12/2020 11:55 AM EST ----- No tear. Just achilles tendinitis and plantar fasciitis.  Try PT and reeval in one month

## 2020-09-12 NOTE — Telephone Encounter (Signed)
-----   Message from Garrel Ridgel, [place] sent at 09/12/2020 11:56 AM EST ----- The addendum shows tear right achilles. Surgical consult.

## 2020-09-13 NOTE — Telephone Encounter (Signed)
scheduled

## 2020-09-18 ENCOUNTER — Ambulatory Visit: Payer: BC Managed Care – PPO | Admitting: Podiatry

## 2020-10-02 ENCOUNTER — Encounter: Payer: Self-pay | Admitting: Podiatry

## 2020-10-02 ENCOUNTER — Ambulatory Visit (INDEPENDENT_AMBULATORY_CARE_PROVIDER_SITE_OTHER): Payer: BC Managed Care – PPO | Admitting: Podiatry

## 2020-10-02 ENCOUNTER — Other Ambulatory Visit: Payer: Self-pay

## 2020-10-02 DIAGNOSIS — M7731 Calcaneal spur, right foot: Secondary | ICD-10-CM

## 2020-10-02 DIAGNOSIS — S86011D Strain of right Achilles tendon, subsequent encounter: Secondary | ICD-10-CM

## 2020-10-03 NOTE — Progress Notes (Signed)
She presents today complaining of pain to the right Achilles tendon versus the left.  She states that is just not any better than it was.  If anything gets worse.  She is ready to have something done about it if necessary.  Objective: Vital signs are stable she is alert and oriented x3.  MRI was positive for a split tear of the Achilles tendon from its insertion 7 cm proximally as well.  With plantar fasciitis.  Assessment chronic tear of the Achilles right.  Plan: Discussed etiology pathology conservative surgical therapies at this point we will go ahead and consent her for an Achilles tendon repair retrocalcaneal spur resection gastroc recession and cast application.  We discussed pros and cons of this she understands she will be nonweightbearing we discussed the possible postop complications which may include but are not limited to postop pain bleeding swelling infection recurrence in the need for further surgery.  She understands this is amenable to it this will be done outpatient at [facility] surgical center we have provided her with information regarding the surgery center as well as anesthesia group will follow-up with her in the near future.

## 2020-10-10 ENCOUNTER — Encounter: Payer: Self-pay | Admitting: Podiatry

## 2020-10-10 ENCOUNTER — Telehealth: Payer: Self-pay | Admitting: Podiatry

## 2020-10-10 NOTE — Telephone Encounter (Signed)
DOS: 10/19/2020  Procedures: Repair Achilles Tendon Rt (07867), Tenolysis Rt (54492), Gastrocnemius Recess Rt (01007), Heel Spur Resection Rt (12197), and Cast Application Rt  BCBS Effective From 09/22/2017 - 09/21/9998  Deductible: $1,750 with $0 met and $1,750 remaining. Out of Pocket: $3,000 with $0 met and $3,000 remaining. CoInsurance: 100% Copay: $0  Per [place] is Not Required.

## 2020-10-18 ENCOUNTER — Other Ambulatory Visit: Payer: Self-pay | Admitting: Podiatry

## 2020-10-18 MED ORDER — ONDANSETRON HCL 4 MG PO TABS: 4.0000 mg | ORAL_TABLET | Freq: Three times a day (TID) | ORAL | 0 refills | Status: DC | PRN

## 2020-10-18 MED ORDER — OXYCODONE-ACETAMINOPHEN 10-325 MG PO TABS: 1.0000 | ORAL_TABLET | Freq: Three times a day (TID) | ORAL | 0 refills | Status: AC | PRN

## 2020-10-18 MED ORDER — CLINDAMYCIN HCL 150 MG PO CAPS: 150.0000 mg | ORAL_CAPSULE | Freq: Three times a day (TID) | ORAL | 0 refills | Status: DC

## 2020-10-19 DIAGNOSIS — M7731 Calcaneal spur, right foot: Secondary | ICD-10-CM | POA: Diagnosis not present

## 2020-10-19 DIAGNOSIS — M7661 Achilles tendinitis, right leg: Secondary | ICD-10-CM | POA: Diagnosis not present

## 2020-10-19 DIAGNOSIS — M25571 Pain in right ankle and joints of right foot: Secondary | ICD-10-CM | POA: Diagnosis not present

## 2020-10-19 DIAGNOSIS — S86011D Strain of right Achilles tendon, subsequent encounter: Secondary | ICD-10-CM | POA: Diagnosis not present

## 2020-10-19 DIAGNOSIS — M216X1 Other acquired deformities of right foot: Secondary | ICD-10-CM | POA: Diagnosis not present

## 2020-10-19 HISTORY — PX: ACHILLES TENDON SURGERY: SHX542

## 2020-10-25 ENCOUNTER — Encounter: Payer: Self-pay | Admitting: Podiatry

## 2020-10-25 ENCOUNTER — Ambulatory Visit (INDEPENDENT_AMBULATORY_CARE_PROVIDER_SITE_OTHER): Payer: BC Managed Care – PPO | Admitting: Podiatry

## 2020-10-25 ENCOUNTER — Ambulatory Visit (INDEPENDENT_AMBULATORY_CARE_PROVIDER_SITE_OTHER): Payer: BC Managed Care – PPO

## 2020-10-25 ENCOUNTER — Other Ambulatory Visit: Payer: Self-pay

## 2020-10-25 DIAGNOSIS — M79671 Pain in right foot: Secondary | ICD-10-CM

## 2020-10-26 NOTE — Progress Notes (Signed)
Subjective:   Patient ID: Natasha Fritz, female   DOB: 60 y.o.   MRN: [id number]   HPI Patient presents 1 week after having posterior heel repair with gastroc recession.  The surgery was performed by Dr. Milinda Pointer and she presents in full cast at this time   ROS      Objective:  Physical Exam  Digital perfusion is good with patient only having mild pain with stable cast noted and no other pathology noted upon questioning     Assessment:  Doing well post Achilles tendon surgery with cast intact good digital perfusion     Plan:  H&P reviewed x-rays advised on continued elevation and immobilization along with nonweightbearing.  Reappoint 1 week for probable bivalving of cast or what ever Dr. Milinda Pointer feels at this time and encouraged to call concerns which may arise prior to that visit  X-rays indicate satisfactory resection of posterior spurring with staples intact on x-ray

## 2020-10-28 ENCOUNTER — Encounter: Payer: Self-pay | Admitting: Podiatry

## 2020-10-31 ENCOUNTER — Encounter: Payer: Self-pay | Admitting: Podiatry

## 2020-11-01 ENCOUNTER — Other Ambulatory Visit: Payer: Self-pay

## 2020-11-01 ENCOUNTER — Encounter: Payer: Self-pay | Admitting: Podiatry

## 2020-11-01 ENCOUNTER — Ambulatory Visit (INDEPENDENT_AMBULATORY_CARE_PROVIDER_SITE_OTHER): Payer: BC Managed Care – PPO | Admitting: Podiatry

## 2020-11-01 DIAGNOSIS — M7731 Calcaneal spur, right foot: Secondary | ICD-10-CM

## 2020-11-01 DIAGNOSIS — Z9889 Other specified postprocedural states: Secondary | ICD-10-CM

## 2020-11-01 DIAGNOSIS — S86011D Strain of right Achilles tendon, subsequent encounter: Secondary | ICD-10-CM | POA: Diagnosis not present

## 2020-11-03 NOTE — Progress Notes (Signed)
She presents today date of surgery 10/19/2020 right foot Achilles tenolysis and repair with retrocalcaneal heel spur resection gastroc recession and cast application.  She denies fever chills nausea vomiting muscle aches pains calf pain back pain chest pain shortness of breath states that she is doing quite well.  Objective: Vital signs are stable alert oriented x3.  Minimal edema no erythema cellulitis drainage or odor once the cast and dressing were removed.  She has good plantar flexion against resistance.  No signs of infection.  Staples and sutures are still intact.  Assessment: Well-healing surgical foot right.  Plan: Redressed today dressed a compressive dressing placed her back in a below-knee fiberglass cast after dressed a compressive dressing was applied she will continue nonweightbearing status I will follow-up with her in 2 weeks for cast removal and staple removal.

## 2020-11-15 ENCOUNTER — Other Ambulatory Visit: Payer: Self-pay

## 2020-11-15 ENCOUNTER — Ambulatory Visit (INDEPENDENT_AMBULATORY_CARE_PROVIDER_SITE_OTHER): Payer: BC Managed Care – PPO | Admitting: Podiatry

## 2020-11-15 ENCOUNTER — Encounter: Payer: Self-pay | Admitting: Podiatry

## 2020-11-15 ENCOUNTER — Ambulatory Visit: Payer: BC Managed Care – PPO

## 2020-11-15 DIAGNOSIS — S86011D Strain of right Achilles tendon, subsequent encounter: Secondary | ICD-10-CM

## 2020-11-15 MED ORDER — METHYLPREDNISOLONE 4 MG PO TBPK: ORAL_TABLET | ORAL | 0 refills | Status: DC

## 2020-11-15 MED ORDER — MELOXICAM 15 MG PO TABS: 15.0000 mg | ORAL_TABLET | Freq: Every day | ORAL | 3 refills | Status: DC

## 2020-11-15 NOTE — Progress Notes (Signed)
She presents today for follow-up of her Achilles tenolysis and repair of the retrocalcaneal heel spur resection gastroc recession cast application.  Date of surgery October 19 2020.  States that she is doing quite well with no problems.  Cast is dry and clean and intact was removed demonstrates sutures are intact margins are well coapted.  Removed all the staples today in the gastroc recession as well as the posterior heel.  Margins remain well coapted she is doing quite well with this has good plantar flexion against resistance.  Assessment: Well-healing surgical foot.  Plan: Placed her in a compression anklet today and a cam walker.  At this point she is going to continue the use of the cam walker as if it were a cast and she will remain nonweightbearing.  I will follow-up with her in about 2 weeks at which time we will progress from not walking to partial weightbearing to full weightbearing.

## 2020-11-29 ENCOUNTER — Ambulatory Visit (INDEPENDENT_AMBULATORY_CARE_PROVIDER_SITE_OTHER): Payer: BC Managed Care – PPO | Admitting: Podiatry

## 2020-11-29 ENCOUNTER — Other Ambulatory Visit: Payer: Self-pay

## 2020-11-29 DIAGNOSIS — S86011D Strain of right Achilles tendon, subsequent encounter: Secondary | ICD-10-CM

## 2020-11-29 DIAGNOSIS — M7731 Calcaneal spur, right foot: Secondary | ICD-10-CM

## 2020-11-29 DIAGNOSIS — Z9889 Other specified postprocedural states: Secondary | ICD-10-CM

## 2020-11-29 NOTE — Progress Notes (Signed)
She presents today for postop visit date of surgery 10/19/2020 status post retrocalcaneal heel spur resection repair of the Achilles tendon gastroc recession.  She states that she is doing quite well has very little pain other than the last centimeter of the distal incision.  She states that is mildly tender where the scab come off she is been soaking it local wound care states that is still a little soupy though.  Objective: Vital signs are stable she is alert and oriented x3.  There is no erythematous mild edema no cellulitis drainage or odor the last centimeter of her incision does demonstrate a little bit of dehiscence there is appears not to be any drainage.  Assessment: Well-healing surgical foot leg.  Plan: At this point I am going to allow her to start partial weightbearing and I demonstrated that to her today with her knee scooter and her crutches.  Both she and her husband understand this.  I will follow-up with him in about 2-1/2 weeks at which time we will transition to full weightbearing.

## 2020-12-12 ENCOUNTER — Other Ambulatory Visit: Payer: Self-pay | Admitting: Internal Medicine

## 2020-12-12 DIAGNOSIS — F419 Anxiety disorder, unspecified: Secondary | ICD-10-CM

## 2020-12-18 ENCOUNTER — Ambulatory Visit (INDEPENDENT_AMBULATORY_CARE_PROVIDER_SITE_OTHER): Payer: BC Managed Care – PPO | Admitting: Podiatry

## 2020-12-18 ENCOUNTER — Other Ambulatory Visit: Payer: Self-pay

## 2020-12-18 ENCOUNTER — Encounter: Payer: Self-pay | Admitting: Podiatry

## 2020-12-18 DIAGNOSIS — S86011D Strain of right Achilles tendon, subsequent encounter: Secondary | ICD-10-CM

## 2020-12-18 DIAGNOSIS — Z9889 Other specified postprocedural states: Secondary | ICD-10-CM

## 2020-12-18 DIAGNOSIS — M7731 Calcaneal spur, right foot: Secondary | ICD-10-CM

## 2020-12-18 NOTE — Progress Notes (Signed)
She presents today date of surgery October 19, 2020 status post Achilles tenolysis with gastroc recession and cast.  States that the majority of her pain is in her heel.  She has been partial weightbearing with her crutches in her cam walker.  All in all she states that she is feels like she is doing pretty well.  She states this made a big difference over the past week or so.  Objective: Vital signs are stable alert oriented x3.  Pulses are palpable.  Incision is healing very nicely the very distalmost aspect still has a small scab but appears to be loosened.  I think once this resolves that she can start massaging the area and help break down scar tissue this is going to help immensely.  She has good plantar flexion against resistance the margins of the Achilles appear to be normal there is very little edema.  Assessment: Well-healing surgical foot and leg.  Plan: I will allow her to start ambulating weightbearing with the cam boot.  And I will follow-up with her in 2 weeks at which time we will progress to regular walking in her shoe with a Tri-Lock brace.  As the patient was leaving she was walking without her crutches and doing very well almost at her normal pace.

## 2020-12-20 ENCOUNTER — Encounter: Payer: BC Managed Care – PPO | Admitting: Podiatry

## 2021-01-01 ENCOUNTER — Ambulatory Visit (INDEPENDENT_AMBULATORY_CARE_PROVIDER_SITE_OTHER): Payer: BC Managed Care – PPO | Admitting: Podiatry

## 2021-01-01 ENCOUNTER — Other Ambulatory Visit: Payer: Self-pay

## 2021-01-01 ENCOUNTER — Encounter: Payer: Self-pay | Admitting: Podiatry

## 2021-01-01 DIAGNOSIS — Z9889 Other specified postprocedural states: Secondary | ICD-10-CM

## 2021-01-01 DIAGNOSIS — M7731 Calcaneal spur, right foot: Secondary | ICD-10-CM

## 2021-01-01 DIAGNOSIS — S86011D Strain of right Achilles tendon, subsequent encounter: Secondary | ICD-10-CM

## 2021-01-01 NOTE — Progress Notes (Signed)
She presents today for postop visit Achilles tenolysis with heel spur resection gastroc recession and cast.  She states is just a little achy but all in all is doing much better.  She denies fever chills nausea vomiting muscle aches pains calf pain back pain chest pain shortness of breath.  Date of surgery October 19, 2020 right foot.  Objective: Vital signs are stable she is alert and oriented x3.  Pulses are palpable.  She has completely healed all of her incision sites.  She has atrophy of the right gastrosoleus complex but she has good plantar flexion against resistance with no pain.  She states that it is weak.  Of the margins of the Achilles feels normal.  She has good range of motion of the toes.  Assessment: Well-healing surgical foot and leg right.  Plan: Discussed etiology pathology conservative surgical therapies at this point were going to put her in her tennis shoe and allow her to start bearing weight on this.  She understands that since that she is 2 months out she has enough strength that she can do so and that it has healed well enough.  She understands the limitations of this which we discussed thoroughly today and I will follow-up with her in 2 to 3 weeks.

## 2021-01-10 ENCOUNTER — Encounter: Payer: Self-pay | Admitting: Adult Health

## 2021-01-10 NOTE — Progress Notes (Signed)
6MOV  Assessment and Plan:   Essential hypertension Continue medication; atypically elevated with recent pain; typically well controlled per home log; increase atenolol to 50 mg if needed for persistent elevations Monitor blood pressure at home; call if consistently over 130/80 Continue DASH diet.   Reminder to go to the ER if any CP, SOB, nausea, dizziness, severe HA, changes vision/speech, left arm numbness and tingling and jaw pain. - CBC with Differential/Platelet - CMP/GFR   Hyperlipidemia -myalgia with pravastatin 20 mg  - try zetia 10 mg daily; discussed and information given  - check lipids, decrease fatty foods, increase activity.  - Lipid panel - TSH  Prediabetes Discussed general issues about diabetes pathophysiology and management., Educational material distributed., Suggested low cholesterol diet., Encouraged aerobic exercise., Discussed foot care., Reminded to get yearly retinal exam. - Hemoglobin A1c  Medication management - Magnesium  Vitamin D deficiency Below goal at recent check; continue to recommend supplementation for goal of 60-100 Check vitamin D level  Obesity - BMI 30  Long discussion about weight loss, diet, and exercise Recommended diet heavy in fruits and veggies and low in animal meats, cheeses, and dairy products, appropriate calorie intake Patient will work on portions, add exercise - try stationary cycle if tolerated  Discussed appropriate weight for height and initial goal <170 lb  Follow up at next visit  Anxiety Recently tapered off of medications and doing well Rare xanax use Stress management techniques discussed, increase water, good sleep hygiene discussed, increase exercise, and increase veggies.     Discussed med's effects and SE's. Screening labs and tests as requested with regular follow-up as recommended. Over 40 minutes of exam, counseling, chart review, and complex, high level critical decision making was performed this  visit.  Future Appointments  Date Time Provider [place]  01/17/2021 11:15 AM Wilder, Bennie Pierini T, [place] [facility]  07/10/2021 10:00 AM Liane Comber, NP [facility] None    HPI  60 y.o. female  presents for a 6 month follow up on htn, hyperlipidemia, prediabetes, vitamin D def, weight.   She works for [employer] in Engineer, technical sales.   She was on lexapro for anxiety but recently tapered off and continues to do well.   Underwent R achilles surgery by Dr. Milinda Pointer in Jan 2022 following 12+ months of tendonitis, just got out of boot last week, has follow up next week.   BMI is Body mass index is 30.12 kg/m., she has been working on diet, doing gentle walking due to achilles, currently managing with meloxicam. She has had some snoring in the last year, no other concerning sx, wants to work on weight with her husband, may do nutrisystem.  Wt Readings from Last 3 Encounters:  01/11/21 185 lb 3.2 oz (84 kg)  08/15/20 185 lb (83.9 kg)  07/23/20 187 lb (84.8 kg)   Her blood pressure has not been controlled at home (typically 120-130/70s, higher this week/when having more pain), she is normally on benicar 40mg  and atenolol 25 once a day, today their BP is BP: (!) 144/88 She does not workout. She denies chest pain, shortness of breath, dizziness.   She is not on cholesterol medication (pravastatin 20 mg daily caused myalgias) and denies myalgias. Her cholesterol is not at goal. Mom had CVA (hemorrhagic). The cholesterol last visit was:   Lab Results  Component Value Date   CHOL 207 (H) 07/09/2020   HDL 39 (L) 07/09/2020   LDLCALC 141 (H) 07/09/2020   TRIG 148 07/09/2020   CHOLHDL 5.3 (  H) 07/09/2020   She has been working on diet and exercise for prediabetes, and denies paresthesia of the feet, polydipsia, polyuria and visual disturbances. Last A1C in the office was:  Lab Results  Component Value Date   HGBA1C 5.7 (H) 07/09/2020   Patient is on Vitamin D supplement, increased to taking 2000 IU last  visit    Lab Results  Component Value Date   VD25OH 41 07/09/2020       Current Medications:  Current Outpatient Medications on File Prior to Visit  Medication Sig Dispense Refill  . ALPRAZolam (XANAX) 0.5 MG tablet TAKE 1 TABLET BY MOUTH 2 TIMES DAILY AS NEEDED 60 tablet 0  . aspirin 81 MG EC tablet Take 81 mg by mouth daily.    [name] atenolol (TENORMIN) 25 MG tablet Take 1 to 2 tablets at Bedtime for BP & Migraine Prophylaxis 180 tablet 1  . cholecalciferol (VITAMIN D) 1000 UNITS tablet Take 1,000 Units by mouth daily.    . famotidine (PEPCID) 10 MG tablet Take 10 mg by mouth 2 (two) times daily as needed.    . fluocinonide cream (LIDEX) 7.41 % Apply 1 application topically 2 (two) times daily.     . fluticasone (FLONASE) 50 MCG/ACT nasal spray Place 2 sprays into both nostrils at bedtime. 16 g 1  . ketoconazole (NIZORAL) 2 % cream Apply 1 application topically daily.     . meloxicam (MOBIC) 15 MG tablet Take 15 mg by mouth daily.    . Multiple Vitamin (MULTIVITAMIN) tablet Take 1 tablet by mouth daily.    . nitroGLYCERIN (NITRODUR - DOSED IN MG/24 HR) 0.2 mg/hr patch PLACE 1 PATCH ONTO THE SKIN ONCE A DAY 30 patch 12  . olmesartan (BENICAR) 40 MG tablet Take 1 tablet Daily for BP 90 tablet 0  . Omega-3 Fatty Acids (FISH OIL) 1200 MG CAPS Take 1 capsule by mouth daily.    . ondansetron (ZOFRAN) 4 MG tablet Take 1 tablet (4 mg total) by mouth every 8 (eight) hours as needed. 20 tablet 0  . predniSONE (DELTASONE) 20 MG tablet 2 tablets daily for 3 days, 1 tablet daily for 4 days. 10 tablet 0  . promethazine-dextromethorphan (PROMETHAZINE-DM) 6.25-15 MG/5ML syrup TAKE 1 TEASPOONFUL (5 mls) BY MOUTH 4 TIMES DAILY AS NEEDED FOR COUGH 240 mL 1  . valACYclovir (VALTREX) 500 MG tablet Take     1 tablet     Daily  -  prophylaxis for Cold Sores / Fever Blisters 90 tablet 0  . pravastatin (PRAVACHOL) 20 MG tablet Take 1 tablet (20 mg total) by mouth every evening. (Patient not taking: Reported on  01/11/2021) 90 tablet 1   No current facility-administered medications on file prior to visit.   Medical History:  Past Medical History:  Diagnosis Date  . Achilles tendinitis   . Acid reflux 07/09/2020  . Anxiety   . Basal cell carcinoma of nose 05/23/2015  . 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 HSV-1 infection   . HTN (hypertension)   . Hyperlipidemia   . Migraine   . PONV (postoperative nausea and vomiting)    Allergies Allergies  Allergen Reactions  . Penicillins     REACTION: hives    SURGICAL HISTORY She  has a past surgical history that includes Tubal ligation (1994); acl replacement; Cervical disc surgery (2012); Robotic assisted total hysterectomy (09/10/2012); Bilateral salpingectomy (09/10/2012); De Quervian's release (Bilateral, 2019); Colonoscopy (2011); and Achilles tendon  surgery (Right, 10/19/2020). FAMILY HISTORY Her family history includes Alzheimer's disease in her father; Breast cancer in her mother; Cancer in her maternal grandfather; Diabetes in her brother; Heart disease in her brother; Hyperlipidemia in her brother; Hypertension in her brother, brother, and mother; Lung cancer in her mother; Stroke in her mother. SOCIAL HISTORY She  reports that she is a non-smoker but has been exposed to tobacco smoke. She has never used smokeless tobacco. She reports current alcohol use. She reports that she does not use drugs.  Review of Systems:  Review of Systems  Constitutional: Negative.  Negative for malaise/fatigue and weight loss.  HENT: Negative for congestion, ear discharge, ear pain, hearing loss, nosebleeds, sore throat and tinnitus.   Eyes: Negative.  Negative for blurred vision and double vision.  Respiratory: Negative.  Negative for cough, shortness of breath, wheezing and stridor.   Cardiovascular: Negative.  Negative for chest pain, palpitations, orthopnea, claudication  and leg swelling.  Gastrointestinal: Negative.  Negative for abdominal pain, blood in stool, constipation, diarrhea, heartburn, melena, nausea and vomiting.  Genitourinary: Negative.   Musculoskeletal: Negative for back pain, falls, joint pain, myalgias and neck pain.       L bil achilles tenderness  Skin: Negative.  Negative for rash.  Neurological: Negative for dizziness, tingling, tremors, sensory change, speech change, focal weakness, seizures, loss of consciousness, weakness and headaches.  Endo/Heme/Allergies: Negative.  Negative for polydipsia.  Psychiatric/Behavioral: Negative for depression, hallucinations, memory loss, substance abuse and suicidal ideas. The patient is nervous/anxious (improving on med). The patient does not have insomnia.   All other systems reviewed and are negative.   Physical Exam: Estimated body mass index is 30.12 kg/m as calculated from the following:   Height as of 07/09/20: 5' 5.75" (1.67 m).   Weight as of this encounter: 185 lb 3.2 oz (84 kg). BP (!) 144/88   Pulse 75   Temp (!) 97.3 F (36.3 C)   Wt 185 lb 3.2 oz (84 kg)   LMP 09/05/2012   SpO2 99%   BMI 30.12 kg/m   Wt Readings from Last 3 Encounters:  01/11/21 185 lb 3.2 oz (84 kg)  08/15/20 185 lb (83.9 kg)  07/23/20 187 lb (84.8 kg)   General Appearance: Well nourished, in no apparent distress.  Eyes: PERRLA, EOMs, conjunctiva no swelling or erythema Sinuses: No Frontal/maxillary tenderness  ENT/Mouth: Ext aud canals clear, normal light reflex with TMs without erythema, bulging. Good dentition. No erythema, swelling, or exudate on post pharynx. Tonsils not swollen or erythematous. Hearing normal.  Neck: Supple, thyroid normal. No bruits  Respiratory: Respiratory effort normal, BS equal bilaterally without rales, rhonchi, wheezing or stridor.  Cardio: RRR without murmurs, rubs or gallops. Brisk peripheral pulses without edema.  Chest: symmetric, with normal excursions and percussion.   Breasts: defer to GYN Abdomen: Soft, nontender, no guarding, rebound, hernias, masses, or organomegaly.  Lymphatics: Non tender without lymphadenopathy.  Genitourinary: defer to GYN Musculoskeletal: Full ROM all peripheral extremities,5/5 strength, and mildly antalgic gait. She left bilateral achilles tendon tenderness, right with well healed surgical scar.  Skin: Warm, dry without rashes, lesions, ecchymosis. Neuro: Cranial nerves intact, reflexes equal bilaterally. Normal muscle tone, no cerebellar symptoms. Sensation intact.  Psych: Awake and oriented X 3, normal affect, Insight and Judgment appropriate.   Izora Ribas 9:17 AM [facility]

## 2021-01-11 ENCOUNTER — Other Ambulatory Visit: Payer: Self-pay

## 2021-01-11 ENCOUNTER — Ambulatory Visit (INDEPENDENT_AMBULATORY_CARE_PROVIDER_SITE_OTHER): Payer: BC Managed Care – PPO | Admitting: Adult Health

## 2021-01-11 ENCOUNTER — Encounter: Payer: Self-pay | Admitting: Adult Health

## 2021-01-11 VITALS — BP 144/88 | HR 75 | Temp 97.3°F | Wt 185.2 lb

## 2021-01-11 DIAGNOSIS — E785 Hyperlipidemia, unspecified: Secondary | ICD-10-CM

## 2021-01-11 DIAGNOSIS — I10 Essential (primary) hypertension: Secondary | ICD-10-CM | POA: Diagnosis not present

## 2021-01-11 DIAGNOSIS — R7309 Other abnormal glucose: Secondary | ICD-10-CM

## 2021-01-11 DIAGNOSIS — E669 Obesity, unspecified: Secondary | ICD-10-CM | POA: Diagnosis not present

## 2021-01-11 DIAGNOSIS — E559 Vitamin D deficiency, unspecified: Secondary | ICD-10-CM | POA: Diagnosis not present

## 2021-01-11 DIAGNOSIS — Z79899 Other long term (current) drug therapy: Secondary | ICD-10-CM

## 2021-01-11 DIAGNOSIS — F419 Anxiety disorder, unspecified: Secondary | ICD-10-CM

## 2021-01-11 MED ORDER — ALPRAZOLAM 0.5 MG PO TABS: ORAL_TABLET | ORAL | 0 refills | Status: AC

## 2021-01-11 MED ORDER — EZETIMIBE 10 MG PO TABS: 10.0000 mg | ORAL_TABLET | Freq: Every day | ORAL | 3 refills | Status: DC

## 2021-01-11 NOTE — Patient Instructions (Signed)
Goals    .  Blood Pressure < 130/80    .  LDL CALC < 130    .  Weight (lb) < 170 lb (77.1 kg) (pt-stated)         High-Fiber Eating Plan Fiber, also called dietary fiber, is a type of carbohydrate. It is found foods such as fruits, vegetables, whole grains, and beans. A high-fiber diet can have many health benefits. Your health care provider may recommend a high-fiber diet to help:  Prevent constipation. Fiber can make your bowel movements more regular.  Lower your cholesterol.  Relieve the following conditions: ? Inflammation of veins in the anus (hemorrhoids). ? Inflammation of specific areas of the digestive tract (uncomplicated diverticulosis). ? A problem of the large intestine, also called the colon, that sometimes causes pain and diarrhea (irritable bowel syndrome, or IBS).  Prevent overeating as part of a weight-loss plan.  Prevent heart disease, type 2 diabetes, and certain cancers. What are tips for following this plan? Reading food labels  Check the nutrition facts label on food products for the amount of dietary fiber. Choose foods that have 5 grams of fiber or more per serving.  The goals for recommended daily fiber intake include: ? Men (age 88 or younger): 34-38 g. ? Men (over age 25): 28-34 g. ? Women (age 35 or younger): 25-28 g. ? Women (over age 38): 22-25 g. Your daily fiber goal is _____________ g.   Shopping  Choose whole fruits and vegetables instead of processed forms, such as apple juice or applesauce.  Choose a wide variety of high-fiber foods such as avocados, lentils, oats, and kidney beans.  Read the nutrition facts label of the foods you choose. Be aware of foods with added fiber. These foods often have high sugar and sodium amounts per serving. Cooking  Use whole-grain flour for baking and cooking.  Cook with brown rice instead of white rice. Meal planning  Start the day with a breakfast that is high in fiber, such as a cereal that  contains 5 g of fiber or more per serving.  Eat breads and cereals that are made with whole-grain flour instead of refined flour or white flour.  Eat brown rice, bulgur wheat, or millet instead of white rice.  Use beans in place of meat in soups, salads, and pasta dishes.  Be sure that half of the grains you eat each day are whole grains. General information  You can get the recommended daily intake of dietary fiber by: ? Eating a variety of fruits, vegetables, grains, nuts, and beans. ? Taking a fiber supplement if you are not able to take in enough fiber in your diet. It is better to get fiber through food than from a supplement.  Gradually increase how much fiber you consume. If you increase your intake of dietary fiber too quickly, you may have bloating, cramping, or gas.  Drink plenty of water to help you digest fiber.  Choose high-fiber snacks, such as berries, raw vegetables, nuts, and popcorn. What foods should I eat? Fruits Berries. Pears. Apples. Oranges. Avocado. Prunes and raisins. Dried figs. Vegetables Sweet potatoes. Spinach. Kale. Artichokes. Cabbage. Broccoli. Cauliflower. Green peas. Carrots. Squash. Grains Whole-grain breads. Multigrain cereal. Oats and oatmeal. Brown rice. Barley. Bulgur wheat. Garfield. Quinoa. Bran muffins. Popcorn. Rye wafer crackers. Meats and other proteins Navy beans, kidney beans, and pinto beans. Soybeans. Split peas. Lentils. Nuts and seeds. Dairy Fiber-fortified yogurt. Beverages Fiber-fortified soy milk. Fiber-fortified orange juice. Other foods Fiber bars.  The items listed above may not be a complete list of recommended foods and beverages. Contact a dietitian for more information. What foods should I avoid? Fruits Fruit juice. Cooked, strained fruit. Vegetables Fried potatoes. Canned vegetables. Well-cooked vegetables. Grains White bread. Pasta made with refined flour. White rice. Meats and other proteins Fatty cuts of meat.  Fried chicken or fried fish. Dairy Milk. Yogurt. Cream cheese. Sour cream. Fats and oils Butters. Beverages Soft drinks. Other foods Cakes and pastries. The items listed above may not be a complete list of foods and beverages to avoid. Talk with your dietitian about what choices are best for you. Summary  Fiber is a type of carbohydrate. It is found in foods such as fruits, vegetables, whole grains, and beans.  A high-fiber diet has many benefits. It can help to prevent constipation, lower blood cholesterol, aid weight loss, and reduce your risk of heart disease, diabetes, and certain cancers.  Increase your intake of fiber gradually. Increasing fiber too quickly may cause cramping, bloating, and gas. Drink plenty of water while you increase the amount of fiber you consume.  The best sources of fiber include whole fruits and vegetables, whole grains, nuts, seeds, and beans. This information is not intended to replace advice given to you by your health care provider. Make sure you discuss any questions you have with your health care provider. Document Revised: 01/12/2020 Document Reviewed: 01/12/2020 Elsevier Patient Education  2021 [place].    Ezetimibe Tablets What is this medicine? EZETIMIBE (ez ET i mibe) treats high cholesterol. Ezetimibe blocks the absorption of cholesterol from the stomach. It is used with lifestyle changes, like diet and exercise. It may be used alone or with other medicines. This medicine may be used for other purposes; ask your health care provider or pharmacist if you have questions. COMMON BRAND NAME(S): Zetia What should I tell my health care provider before I take this medicine? They need to know if you have any of these conditions:  kidney disease  liver disease  muscle cramps, pain  muscle injury  thyroid disease  an unusual or allergic reaction to ezetimibe, other medicines, foods, dyes, or preservatives  pregnant or trying to get  pregnant  breast-feeding How should I use this medicine? Take this medicine by mouth. Take it as directed on the prescription label at the same time every day. You can take it with or without food. If it upsets your stomach, take it with food. Keep taking it unless your health care provider tells you to stop. Take bile acid sequestrants at a different time of day than this medicine. Take this medicine 2 hours BEFORE or 4 hours AFTER bile acid sequestrants. Talk to your health care provider about the use of this medicine in children. While it may be prescribed for children as young as 10 for selected conditions, precautions do apply. Overdosage: If you think you have taken too much of this medicine contact a poison control center or emergency room at once. NOTE: This medicine is only for you. Do not share this medicine with others. What if I miss a dose? If you miss a dose, take it as soon as you can. If it is almost time for your next dose, take only that dose. Do not take double or extra doses. What may interact with this medicine? Do not take this medicine with any of the following medications:  fenofibrate  gemfibrozil This medicine may also interact with the following medications:  antacids  cyclosporine  herbal medicines like red yeast rice  other medicines to lower cholesterol or triglycerides This list may not describe all possible interactions. Give your health care provider a list of all the medicines, herbs, non-prescription drugs, or dietary supplements you use. Also tell them if you smoke, drink alcohol, or use illegal drugs. Some items may interact with your medicine. What should I watch for while using this medicine? Visit your health care provider for regular checks on your progress. Tell your health care provider if your symptoms do not start to get better or if they get worse. Your health care provider may tell you to stop taking this medicine if you develop muscle  problems. If your muscle problems do not go away after stopping this medicine, contact your health care provider. Do not become pregnant while taking this medicine. Women should inform their health care provider if they wish to become pregnant or think they might be pregnant. There is potential for serious harm to an unborn child. Talk to your health care provider for more information. Do not breast-feed an infant while taking this medicine. Taking this medicine is only part of a total heart healthy program. Your health care provider may give you a special diet to follow. Avoid alcohol. Avoid smoking. Ask your health care provider how much you should exercise. What side effects may I notice from receiving this medicine? Side effects that you should report to your doctor or health care provider as soon as possible:  allergic reactions (skin rash, itching or hives; swelling of the face, lips, or tongue)  liver injury (dark yellow or brown urine; general ill feeling or flu-like symptoms; loss of appetite, right upper belly pain; unusually weak or tired, yellowing of the eyes or skin)  muscle injury (dark urine; trouble passing urine or change in the amount of urine; unusually weak or tired; muscle pain; back pain) Side effects that usually do not require medical attention (report to your doctor or health care provider if they continue or are bothersome):  depressed mood  diarrhea  headache  infection (fever, chills, cough, sore throat, pain, or trouble passing urine)  joint pain  stomach pain This list may not describe all possible side effects. Call your doctor for medical advice about side effects. You may report side effects to FDA at 1-800-FDA-1088. Where should I keep my medicine? Keep out of the reach of children and pets. Store at room temperature between 15 and 30 degrees C (59 and 86 degrees F). Protect from moisture. Get rid of any unused medicine after the expiration date. NOTE:  This sheet is a summary. It may not cover all possible information. If you have questions about this medicine, talk to your doctor, pharmacist, or health care provider.  2021 Elsevier/Gold Standard (2019-08-17 17:28:51)

## 2021-01-12 LAB — COMPLETE METABOLIC PANEL WITH GFR
AG Ratio: 1.9 (calc) (ref 1.0–2.5)
ALT: 14 U/L (ref 6–29)
AST: 16 U/L (ref 10–35)
Albumin: 4.6 g/dL (ref 3.6–5.1)
Alkaline phosphatase (APISO): 96 U/L (ref 37–153)
BUN: 20 mg/dL (ref 7–25)
CO2: 30 mmol/L (ref 20–32)
Calcium: 10.2 mg/dL (ref 8.6–10.4)
Chloride: 103 mmol/L (ref 98–110)
Creat: 0.74 mg/dL (ref 0.50–0.99)
GFR, Est African American: 101 mL/min/{1.73_m2} (ref >=60)
GFR, Est Non African American: 87 mL/min/{1.73_m2} (ref >=60)
Globulin: 2.4 g/dL (calc) (ref 1.9–3.7)
Glucose, Bld: 92 mg/dL (ref 65–99)
Potassium: 4.3 mmol/L (ref 3.5–5.3)
Sodium: 140 mmol/L (ref 135–146)
Total Bilirubin: 0.5 mg/dL (ref 0.2–1.2)
Total Protein: 7 g/dL (ref 6.1–8.1)

## 2021-01-12 LAB — CBC WITH DIFFERENTIAL/PLATELET
Absolute Monocytes: 462 cells/uL (ref 200–950)
Basophils Absolute: 53 cells/uL (ref 0–200)
Basophils Relative: 0.8 %
Eosinophils Absolute: 112 cells/uL (ref 15–500)
Eosinophils Relative: 1.7 %
HCT: 42 % (ref 35.0–45.0)
Hemoglobin: 13.9 g/dL (ref 11.7–15.5)
Lymphs Abs: 1511 cells/uL (ref 850–3900)
MCH: 29.4 pg (ref 27.0–33.0)
MCHC: 33.1 g/dL (ref 32.0–36.0)
MCV: 89 fL (ref 80.0–100.0)
MPV: 11.4 fL (ref 7.5–12.5)
Monocytes Relative: 7 %
Neutro Abs: 4462 cells/uL (ref 1500–7800)
Neutrophils Relative %: 67.6 %
Platelets: 281 10*3/uL (ref 140–400)
RBC: 4.72 10*6/uL (ref 3.80–5.10)
RDW: 12.3 % (ref 11.0–15.0)
Total Lymphocyte: 22.9 %
WBC: 6.6 10*3/uL (ref 3.8–10.8)

## 2021-01-12 LAB — TSH: TSH: 2.05 mIU/L (ref 0.40–4.50)

## 2021-01-12 LAB — HEMOGLOBIN A1C
Hgb A1c MFr Bld: 5.8 % of total Hgb — ABNORMAL HIGH (ref <5.7)
Mean Plasma Glucose: 120 mg/dL
eAG (mmol/L): 6.6 mmol/L

## 2021-01-12 LAB — VITAMIN D 25 HYDROXY (VIT D DEFICIENCY, FRACTURES): Vit D, 25-Hydroxy: 38 ng/mL (ref 30–100)

## 2021-01-12 LAB — LIPID PANEL
Cholesterol: 211 mg/dL — ABNORMAL HIGH (ref <200)
HDL: 37 mg/dL — ABNORMAL LOW (ref >=50)
LDL Cholesterol (Calc): 139 mg/dL (calc) — ABNORMAL HIGH
Non-HDL Cholesterol (Calc): 174 mg/dL (calc) — ABNORMAL HIGH (ref <130)
Total CHOL/HDL Ratio: 5.7 (calc) — ABNORMAL HIGH (ref <5.0)
Triglycerides: 209 mg/dL — ABNORMAL HIGH (ref <150)

## 2021-01-12 LAB — MAGNESIUM: Magnesium: 2.3 mg/dL (ref 1.5–2.5)

## 2021-01-17 ENCOUNTER — Encounter: Payer: Self-pay | Admitting: Podiatry

## 2021-01-17 ENCOUNTER — Ambulatory Visit (INDEPENDENT_AMBULATORY_CARE_PROVIDER_SITE_OTHER): Payer: BC Managed Care – PPO | Admitting: Podiatry

## 2021-01-17 ENCOUNTER — Other Ambulatory Visit: Payer: Self-pay

## 2021-01-17 DIAGNOSIS — S86011D Strain of right Achilles tendon, subsequent encounter: Secondary | ICD-10-CM

## 2021-01-17 DIAGNOSIS — M7731 Calcaneal spur, right foot: Secondary | ICD-10-CM

## 2021-01-17 DIAGNOSIS — Z9889 Other specified postprocedural states: Secondary | ICD-10-CM

## 2021-01-17 NOTE — Progress Notes (Signed)
She presents today for postop visit date of surgery October 19, 2020 Achilles tenolysis and gastroc recession right foot.  Denies fever chills nausea vomit muscle aches pains calf pain back pain chest pain shortness of breath.  Objective: She has great range of motion hardly any pain other than the distalmost aspect of the incision which is still tender as it still has concern amount of scar tissue present.  She is getting good strength in plantarflexion endurance is good.  Assessment: Well-healing surgical foot and ankle.  Plan: I would like to follow-up with her in about 2 months encouraged her to continue to go out and now push through soreness and tenderness.

## 2021-02-09 ENCOUNTER — Other Ambulatory Visit (HOSPITAL_COMMUNITY): Payer: Self-pay

## 2021-03-04 ENCOUNTER — Other Ambulatory Visit: Payer: Self-pay | Admitting: Obstetrics & Gynecology

## 2021-03-04 DIAGNOSIS — Z1231 Encounter for screening mammogram for malignant neoplasm of breast: Secondary | ICD-10-CM

## 2021-03-06 ENCOUNTER — Other Ambulatory Visit: Payer: Self-pay

## 2021-03-06 ENCOUNTER — Ambulatory Visit
Admission: RE | Admit: 2021-03-06 | Discharge: 2021-03-06 | Disposition: A | Discharge status: Home / Self Care | Payer: BC Managed Care – PPO | Source: Ambulatory Visit

## 2021-03-06 DIAGNOSIS — Z1231 Encounter for screening mammogram for malignant neoplasm of breast: Secondary | ICD-10-CM | POA: Diagnosis not present

## 2021-03-19 ENCOUNTER — Other Ambulatory Visit: Payer: Self-pay

## 2021-03-19 ENCOUNTER — Encounter: Payer: Self-pay | Admitting: Podiatry

## 2021-03-19 ENCOUNTER — Ambulatory Visit (INDEPENDENT_AMBULATORY_CARE_PROVIDER_SITE_OTHER): Payer: BC Managed Care – PPO | Admitting: Podiatry

## 2021-03-19 DIAGNOSIS — Z9889 Other specified postprocedural states: Secondary | ICD-10-CM

## 2021-03-19 DIAGNOSIS — S86011D Strain of right Achilles tendon, subsequent encounter: Secondary | ICD-10-CM

## 2021-03-19 DIAGNOSIS — M7731 Calcaneal spur, right foot: Secondary | ICD-10-CM

## 2021-03-20 NOTE — Progress Notes (Signed)
She presents today for follow-up date of surgery October 19, 2020 Achilles tenolysis gastrocsoleus resection and cast application.  States that he still feels stiff and still feels sore.  Objective: Vital signs are stable she is alert and oriented x3 no edema no erythema cellulitis drainage or odor to the right foot.  Good plantar flexion dorsiflexion against resistance.  Mild tenderness on medial lateral compression of the calcaneus posteriorly.  Mild tenderness on palpation of the Achilles.  It is not warm to the touch.  The scar line appears to be loose from his deep structures.  Assessment: Well-healing surgical foot I feel that most likely it needs a little physical therapy.  Plan: At this point I am going to send her to [facility] health physical therapy for Achilles tendon repair.  Evaluate and treat right.

## 2021-03-22 ENCOUNTER — Ambulatory Visit: Payer: BC Managed Care – PPO

## 2021-03-22 ENCOUNTER — Encounter: Payer: Self-pay | Admitting: Physical Therapy

## 2021-03-22 ENCOUNTER — Other Ambulatory Visit: Payer: Self-pay

## 2021-03-22 ENCOUNTER — Ambulatory Visit: Payer: BC Managed Care – PPO | Attending: Podiatry | Admitting: Physical Therapy

## 2021-03-22 DIAGNOSIS — M25571 Pain in right ankle and joints of right foot: Secondary | ICD-10-CM | POA: Diagnosis not present

## 2021-03-22 DIAGNOSIS — M6281 Muscle weakness (generalized): Secondary | ICD-10-CM | POA: Insufficient documentation

## 2021-03-22 DIAGNOSIS — M79661 Pain in right lower leg: Secondary | ICD-10-CM | POA: Insufficient documentation

## 2021-03-22 DIAGNOSIS — R2689 Other abnormalities of gait and mobility: Secondary | ICD-10-CM | POA: Diagnosis not present

## 2021-03-22 NOTE — Therapy (Signed)
[facility] [address] [place], [place], [address] Phone: [phone]   Fax:  [phone]  Physical Therapy Evaluation  Patient Details  Name: Natasha Fritz MRN: [id number] Date of Birth: [birth year] Referring Provider (PT): [place], [place] T, [place]   Encounter Date: 03/22/2021   PT End of Session - 03/22/21 0950     Visit Number 1    Authorization Type BCBS    Authorization - Visit Number 1    Authorization - Number of Visits 90    PT Start Time 0845    PT Stop Time 0929    PT Time Calculation (min) 44 min    Activity Tolerance Patient tolerated treatment well;No increased pain    Behavior During Therapy [facility] for tasks assessed/performed             Past Medical History:  Diagnosis Date   Achilles tendinitis    Acid reflux 07/09/2020   Anxiety    Basal cell carcinoma of nose 05/23/2015   Endometrial hyperplasia without atypia, simple 09/10/2012   Endometrial hyperplasia without atypia, simple 09/10/2012   HSV-1 infection    HTN (hypertension)    Hyperlipidemia    Migraine    PONV (postoperative nausea and vomiting)     Past Surgical History:  Procedure Laterality Date   ACHILLES TENDON SURGERY Right 10/19/2020   Dr. Milinda Pointer   acl replacement     BILATERAL SALPINGECTOMY  09/10/2012   Procedure: BILATERAL SALPINGECTOMY;  Surgeon: Elveria Royals, MD;  Location: [facility];  Service: Gynecology;  Laterality: Bilateral;   CERVICAL DISC SURGERY  2012   fusion, Dr. Joya Salm   COLONOSCOPY  2011   Lionel December release Bilateral 2019   Dr. Burney Gauze    ROBOTIC ASSISTED TOTAL HYSTERECTOMY  09/10/2012   Procedure: ROBOTIC ASSISTED TOTAL HYSTERECTOMY;  Surgeon: Elveria Royals, MD;  Location: [facility];  Service: Gynecology;  Laterality: N/A;  3 hrs.   TUBAL LIGATION  1994    There were no vitals filed for this visit.    Subjective Assessment - 03/22/21 0848     Subjective Pt reports consistent pain with mobility in  Rt ankle, no pain with rest but worsens with acitivity since surgery 10/19/20    Limitations Walking;Standing    How long can you sit comfortably? no limitations    How long can you walk comfortably? 30 mins    Currently in Pain? Yes    Pain Score 2    9/10 at worst pain post increased activity   Pain Location Ankle    Pain Orientation Right    Pain Descriptors / Indicators Aching;Throbbing    Pain Type Acute pain    Pain Radiating Towards no    Pain Onset More than a month ago    Pain Frequency Intermittent    Aggravating Factors  just being on her feet for increased time    Pain Relieving Factors rest, gentle stretching, ice, medication                [facility] PT Assessment - 03/22/21 0001       Assessment   Medical Diagnosis S86.011D (ICD-10-CM) - Rupture of right Achilles tendon, subsequent encounter  M77.31 (ICD-10-CM) - Calcaneal spur of right foot  Z98.890 (ICD-10-CM) - Status post right foot surgery    Referring Provider (PT) [place], Max T, [place]    Onset Date/Surgical Date 10/19/20    Prior Therapy yes but not for this injury  Precautions   Precautions None      Restrictions   Weight Bearing Restrictions No      Balance Screen   Has the patient fallen in the past 6 months No    Has the patient had a decrease in activity level because of a fear of falling?  No    Is the patient reluctant to leave their home because of a fear of falling?  No      Home Ecologist residence    Living Arrangements Spouse/significant other    Available Help at Discharge Family    Type of [place] to enter    Entrance Stairs-Number of Steps [place] One level    [place] None      Prior Function   Level of Independence Independent    Vocation Full time employment    Vocation Requirements work from home      Cognition   Overall Cognitive Status Within Functional Limits for tasks assessed      Sensation    Light Touch Impaired by gross assessment    Additional Comments numbness at scar on Rt ankle      Coordination   Gross Motor Movements are Fluid and Coordinated Yes      Functional Tests   Functional tests Single leg stance;Squat      Squat   Comments poor weight bearing on Rt side with noted trunk lean to Lt      Single Leg Stance   Comments poor weight bearing on Rt side with noted lean to Lt and noted hip drop      Posture/Postural Control   Posture/Postural Control Postural limitations    Postural Limitations Rounded Shoulders;Posterior pelvic tilt      ROM / Strength   AROM / PROM / Strength AROM;PROM;Strength      AROM   Overall AROM  Deficits    AROM Assessment Site Hip;Knee;Ankle;Lumbar;Thoracic    Right/Left Knee --   [facility]   Right/Left Ankle Left;Right    Right Ankle Dorsiflexion 10    Right Ankle Plantar Flexion 30    Right Ankle Inversion 12    Right Ankle Eversion 5    Left Ankle Dorsiflexion 23    Left Ankle Plantar Flexion 35    Left Ankle Inversion 20    Left Ankle Eversion 8    Lumbar Flexion WFL but leans toward Lt    Lumbar Extension limited by 25%    Lumbar - Right Side Bend limited by 50%    Lumbar - Left Side Bend limited by 50%    Lumbar - Right Rotation limited by 50%    Lumbar - Left Rotation limited by 50%    Thoracic - Right Side Bend limited by 50%    Thoracic - Left Side Bend limited by 50%    Thoracic - Right Rotation limited by 50%    Thoracic - Left Rotation limited by 50%      PROM   Overall PROM  Deficits    PROM Assessment Site Ankle    Right/Left Ankle Left;Right    Right Ankle Dorsiflexion 11    Right Ankle Plantar Flexion 34    Right Ankle Inversion 15    Right Ankle Eversion 13    Left Ankle Dorsiflexion 25    Left Ankle Plantar Flexion 38      Strength   Overall Strength Deficits  Strength Assessment Site Hip;Knee;Ankle    Right/Left Hip Right;Left    Right Hip Flexion 4/5    Right Hip Extension 4/5    Right Hip  External Rotation  4-/5    Right Hip Internal Rotation 4-/5    Right Hip ABduction 4/5    Right Hip ADduction 4/5    Left Hip Flexion 4/5    Left Hip Extension 4/5    Left Hip External Rotation 4-/5    Left Hip Internal Rotation 4-/5    Left Hip ABduction 4/5    Left Hip ADduction 4/5    Right/Left Knee --   [facility]   Right/Left Ankle Left;Right    Right Ankle Dorsiflexion 4-/5    Right Ankle Plantar Flexion 4/5    Right Ankle Inversion 3+/5    Right Ankle Eversion 3+/5    Left Ankle Dorsiflexion 4+/5    Left Ankle Plantar Flexion 4+/5    Left Ankle Inversion 4+/5    Left Ankle Eversion 4+/5      Flexibility   Soft Tissue Assessment /Muscle Length --   limited at bil hamstrings and gastrocs     Palpation   Palpation comment tenderness noted at rt calf midline and around scarring at surgery site      Ambulation/Gait   Ambulation/Gait Yes    Gait Comments decreased weight bearing on Rt LE and decreased weight shifting noted                        Objective measurements completed on examination: See above findings.               PT Education - 03/22/21 0950     Education Details Access Code: [email or web address]. Pt educated on evaluation findings, HEP, proper stance and gait mechanics    Person(s) Educated Patient    Methods Explanation;Demonstration;Tactile cues;Verbal cues;Handout    Comprehension Verbalized understanding;Returned demonstration              PT Short Term Goals - 03/22/21 0957       PT SHORT TERM GOAL #1   Title pt to be independent with HEP    Time 6    Period Weeks    Target Date 05/03/21      PT SHORT TERM GOAL #2   Title pt to demonstrate improved strength to Rt ankle to at least 4/5 throughout for improved gait mechanics    Time 6    Period Weeks    Status New    Target Date 05/03/21      PT SHORT TERM GOAL #3   Title pt to demonstrate improved bil hip strength to 5/5 throughout for decreased compensatory strategies with  squats and single leg activities    Time 6    Period Weeks    Status New    Target Date 05/03/21      PT SHORT TERM GOAL #4   Title pt to complete FOTO    Time 2    Period Weeks    Status New    Target Date 04/05/21               PT Long Term Goals - 03/22/21 0959       PT LONG TERM GOAL #1   Title pt to be independent with advanced HEP    Time 4    Period Months    Status New    Target Date 07/23/21      PT  LONG TERM GOAL #2   Title pt to demonstrate improved ankle strength to 5/5 for improved gait and stair mechanics    Time 4    Period Months    Status New    Target Date 07/23/21      PT LONG TERM GOAL #3   Title pt to report increased tolerance to walking for at least 1 hour for improved distances with community mobility    Time 4    Period Months    Status New    Target Date 07/23/21      PT LONG TERM GOAL #4   Title pt to demonstrate improved ROM at Rt ankle to [facility] ranges for improved ambulation and standing balance    Time 4    Period Months    Status New    Target Date 07/23/21      PT LONG TERM GOAL #5   Title pt to demonstrate improved dynamic standing balance on Rt ankle without compensatory hip strategies    Time 4    Period Months    Status New    Target Date 07/23/21                    Plan - 03/22/21 0951     Clinical Impression Statement Pt is 60yo female presenting to clinic with recent history of Rt Achilles tendon repair 10/19/20. Pt has no restrictions for mobility currently and appears to be healing well. Pt reports her biggest compaint is not being able to do her normal activities without pain. During evaluation, pt found to have bil hip weakness and decreased mobility, decreased thoracic and lumbar mobility, bil ankle ROM deficits and decreased strength in Rt ankle. Functionally pt also found to have poor mechanics for single leg stance activities, functional squats, and gait. Pt would benefit from skilled PT for improved  pain control, mobility, flexibility, strength, scor mobility, and improved mechanics for functional mobility.    Personal Factors and Comorbidities Fitness    Examination-Activity Limitations Locomotion Level;Squat;Stairs;Stand;Lift;Carry    Examination-Participation Restrictions Community Activity;Driving;Shop;Yard Work    Stability/Clinical Decision Making Stable/Uncomplicated    Clinical Decision Making Low    Rehab Potential Good    PT Frequency 2x / week    PT Duration --   for 24 visits   PT Treatment/Interventions ADLs/Self Care Home Management;Gait training;Iontophoresis 4mg /ml Dexamethasone;Stair training;Functional mobility training;Therapeutic activities;Therapeutic exercise;Balance training;Neuromuscular re-education;Manual techniques;Patient/family education;Scar mobilization;Passive range of motion;Dry needling;Energy conservation    PT Next Visit Plan do FOTO, go over HEP, ankle ROM and strengthening    PT Home Exercise Plan Access Code: [email or web address]    Consulted and Agree with Plan of Care Patient             Patient will benefit from skilled therapeutic intervention in order to improve the following deficits and impairments:  Abnormal gait, Decreased range of motion, Difficulty walking, Increased fascial restricitons, Decreased endurance, Pain, Hypomobility, Decreased activity tolerance, Decreased balance, Decreased scar mobility, Impaired flexibility, Improper body mechanics, Postural dysfunction, Decreased strength, Decreased mobility  Visit Diagnosis: Muscle weakness (generalized) - Plan: PT plan of care cert/re-cert  Other abnormalities of gait and mobility - Plan: PT plan of care cert/re-cert  Pain of right lower leg - Plan: PT plan of care cert/re-cert     Problem List Patient Active Problem List   Diagnosis Date Noted   Blood pressure check 07/24/2020   History of adenomatous polyp of colon 07/09/2020   Achilles tendinitis of both lower extremities  07/09/2020    Snoring 07/09/2020   Acid reflux 07/09/2020   History of basal cell carcinoma (BCC) 07/05/2020   Obesity (BMI 30.0-34.9) 02/09/2018   Anxiety 02/09/2018   Other abnormal glucose 05/23/2015   Medication management 05/23/2015   Vitamin D deficiency 05/23/2015   DDD (degenerative disc disease), cervical 05/23/2015   Essential hypertension 02/01/2010   Hyperlipidemia 01/31/2010    Stacy Gardner, PT 03/22/2209:04 AM   [facility] [address] [place], [place], [address] Phone: [phone]   Fax:  [phone]  Name: DAJAH FISCHMAN MRN: [id number] Date of Birth: 09-30-1960

## 2021-03-22 NOTE — Patient Instructions (Signed)
Access Code: [email or web address] URL: [email or web address] Date: 03/22/2021 Prepared by: Stacy Gardner  Exercises Gastroc Stretch on Wall - 1 x daily - 7 x weekly - 2 sets - 10 reps Heel rises with counter support - 1 x daily - 7 x weekly - 2 sets - 10 reps Ankle Inversion with Resistance - 1 x daily - 7 x weekly - 2 sets - 5 reps - 45s hold Ankle Eversion with Resistance - 1 x daily - 7 x weekly - 2 sets - 5 reps - 45s hold Ankle Dorsiflexion with Resistance - 1 x daily - 7 x weekly - 2 sets - 5 reps - 45s hold Long Sitting Ankle Plantar Flexion with Resistance - 1 x daily - 7 x weekly - 3 sets - 5 reps - 45s hold Seated Ankle Circles - 1 x daily - 7 x weekly - 2 sets - 10 reps

## 2021-03-25 ENCOUNTER — Other Ambulatory Visit: Payer: Self-pay | Admitting: Adult Health

## 2021-03-26 ENCOUNTER — Encounter: Payer: Self-pay | Admitting: Physical Therapy

## 2021-03-26 ENCOUNTER — Ambulatory Visit: Payer: BC Managed Care – PPO | Admitting: Physical Therapy

## 2021-03-26 ENCOUNTER — Other Ambulatory Visit: Payer: Self-pay

## 2021-03-26 DIAGNOSIS — R2689 Other abnormalities of gait and mobility: Secondary | ICD-10-CM

## 2021-03-26 DIAGNOSIS — M25571 Pain in right ankle and joints of right foot: Secondary | ICD-10-CM

## 2021-03-26 DIAGNOSIS — M6281 Muscle weakness (generalized): Secondary | ICD-10-CM

## 2021-03-26 DIAGNOSIS — M79661 Pain in right lower leg: Secondary | ICD-10-CM | POA: Diagnosis not present

## 2021-03-26 NOTE — Therapy (Signed)
[facility] [address] [place], [place], [address] Phone: [phone]   Fax:  [phone]  Physical Therapy Treatment  Patient Details  Name: Natasha Fritz MRN: [id number] Date of Birth: 21-Mar-1961 Referring Provider (PT): [place], [place] T, [place]   Encounter Date: 03/26/2021   PT End of Session - 03/26/21 1210     Visit Number 2    Authorization Type BCBS    Authorization - Visit Number 2    Authorization - Number of Visits 90    PT Start Time 6789    PT Stop Time 1230    PT Time Calculation (min) 44 min    Activity Tolerance Patient tolerated treatment well;No increased pain    Behavior During Therapy [facility] for tasks assessed/performed             Past Medical History:  Diagnosis Date   Achilles tendinitis    Acid reflux 07/09/2020   Anxiety    Basal cell carcinoma of nose 05/23/2015   Endometrial hyperplasia without atypia, simple 09/10/2012   Endometrial hyperplasia without atypia, simple 09/10/2012   HSV-1 infection    HTN (hypertension)    Hyperlipidemia    Migraine    PONV (postoperative nausea and vomiting)     Past Surgical History:  Procedure Laterality Date   ACHILLES TENDON SURGERY Right 10/19/2020   Dr. Milinda Pointer   acl replacement     BILATERAL SALPINGECTOMY  09/10/2012   Procedure: BILATERAL SALPINGECTOMY;  Surgeon: Elveria Royals, MD;  Location: [facility];  Service: Gynecology;  Laterality: Bilateral;   CERVICAL DISC SURGERY  2012   fusion, Dr. Joya Salm   COLONOSCOPY  2011   Lionel December release Bilateral 2019   Dr. Burney Gauze    ROBOTIC ASSISTED TOTAL HYSTERECTOMY  09/10/2012   Procedure: ROBOTIC ASSISTED TOTAL HYSTERECTOMY;  Surgeon: Elveria Royals, MD;  Location: [facility];  Service: Gynecology;  Laterality: N/A;  3 hrs.   TUBAL LIGATION  1994    There were no vitals filed for this visit.   Subjective Assessment - 03/26/21 1148     Subjective Pt reports she has been doing HEP 2x per day and  once this morning with reporting initially it is painful but with exercises this improves. Pt reports she feels she continues to be tight at the ankle and would like to more mobile but understands this is a process and motivated to improve.    Limitations Walking;Standing    How long can you sit comfortably? no limitations    How long can you walk comfortably? 30 mins    Currently in Pain? Yes    Pain Score 3     Pain Location Ankle    Pain Orientation Right    Pain Descriptors / Indicators Aching    Pain Type Acute pain    Pain Radiating Towards no    Pain Onset More than a month ago    Pain Frequency Intermittent    Aggravating Factors  just being on her feet for increased time    Pain Relieving Factors rest, gentle stretching, ice, medication                               [facility] Adult PT Treatment/Exercise - 03/26/21 0001       Exercises   Exercises Knee/Hip;Ankle      Knee/Hip Exercises: Standing   Functional Squat 10 reps  Ankle Exercises: Standing   Heel Raises 15 reps;Both   2x15 with and without UE support pending pain   Other Standing Ankle Exercises stepping up to step with Rt LE for stretching x10 for 5s holds      Ankle Exercises: Seated   BAPS Sitting;10 reps   in all directions with 2-3s hold and clockwise and counterclockwise x10   Other Seated Ankle Exercises x10 dorsiflexion, plantarflexion, inversion, eversion green band with 2-3s holds                    PT Education - 03/26/21 1231     Education Details Access Code: [email or web address]. pt educated on techniques for stretching, elevation of Rt LE for swelling and use of ice.    Person(s) Educated Patient    Methods Explanation;Demonstration;Tactile cues;Verbal cues    Comprehension Verbalized understanding;Returned demonstration              PT Short Term Goals - 03/26/21 1224       PT SHORT TERM GOAL #1   Title pt to be independent with HEP    Time 6    Period Weeks     Status On-going    Target Date 05/03/21      PT SHORT TERM GOAL #2   Title pt to demonstrate improved strength to Rt ankle to at least 4/5 throughout for improved gait mechanics    Time 6    Period Weeks    Status On-going    Target Date 05/03/21      PT SHORT TERM GOAL #3   Title pt to demonstrate improved bil hip strength to 5/5 throughout for decreased compensatory strategies with squats and single leg activities    Time 6    Period Weeks    Status On-going    Target Date 05/03/21      PT SHORT TERM GOAL #4   Title pt to complete FOTO    Time 2    Period Weeks    Status Achieved    Target Date 04/05/21               PT Long Term Goals - 03/26/21 1225       PT LONG TERM GOAL #1   Title pt to be independent with advanced HEP    Time 4    Period Months    Status On-going      PT LONG TERM GOAL #2   Title pt to demonstrate improved ankle strength to 5/5 for improved gait and stair mechanics    Time 4    Period Months    Status On-going      PT LONG TERM GOAL #3   Title pt to report increased tolerance to walking for at least 1 hour with minimal to no pain for improved distances with community mobility    Time 4    Period Months    Status On-going      PT LONG TERM GOAL #4   Title pt to demonstrate improved ROM at Rt ankle to [facility] ranges for improved ambulation and standing balance    Time 4    Period Months    Status On-going      PT LONG TERM GOAL #5   Title pt to demonstrate improved dynamic standing balance on Rt ankle without compensatory hip strategies    Time 4    Period Months    Status On-going      Additional Long Term Goals  Additional Long Term Goals Yes      PT LONG TERM GOAL #6   Title pt will have improved FOTO score to 64 for improved functional mobility    Baseline 49    Time 4    Period Months    Status New    Target Date 07/27/21                   Plan - 03/26/21 1211     Clinical Impression Statement Pt  presenting to clinic with reporting she has been doing her HEP and trying not to over do with activity. During session, pt demonstrated continued decreased mobility in all planes in Rt ankle and limited with functional mobility. Pt also reports fairly consistent pain in Rt ankle. Pt's session emphasized stretching and strengthening at Rt ankle with pt reporting she felt better at end of session. Pt would benefit from continued PT for increased strength, flexibility, decreased pain in Rt ankle and improved tolerance to activity.    Personal Factors and Comorbidities Fitness    Examination-Activity Limitations Locomotion Level;Squat;Stairs;Stand;Lift;Carry    Examination-Participation Restrictions Community Activity;Driving;Shop;Yard Work    Stability/Clinical Decision Making Stable/Uncomplicated    Clinical Decision Making Low    Rehab Potential Good    PT Frequency 2x / week    PT Duration Other (comment)   for 24 visits   PT Treatment/Interventions ADLs/Self Care Home Management;Gait training;Iontophoresis 4mg /ml Dexamethasone;Stair training;Functional mobility training;Therapeutic activities;Therapeutic exercise;Balance training;Neuromuscular re-education;Manual techniques;Patient/family education;Scar mobilization;Passive range of motion;Dry needling;Energy conservation    PT Next Visit Plan do FOTO, go over HEP, ankle ROM and strengthening    PT Home Exercise Plan Access Code: [email or web address]    Consulted and Agree with Plan of Care Patient             Patient will benefit from skilled therapeutic intervention in order to improve the following deficits and impairments:  Abnormal gait, Decreased range of motion, Difficulty walking, Increased fascial restricitons, Decreased endurance, Pain, Hypomobility, Decreased activity tolerance, Decreased balance, Decreased scar mobility, Impaired flexibility, Improper body mechanics, Postural dysfunction, Decreased strength, Decreased mobility  Visit  Diagnosis: Pain in right ankle and joints of right foot  Muscle weakness (generalized)  Other abnormalities of gait and mobility     Problem List Patient Active Problem List   Diagnosis Date Noted   Blood pressure check 07/24/2020   History of adenomatous polyp of colon 07/09/2020   Achilles tendinitis of both lower extremities 07/09/2020   Snoring 07/09/2020   Acid reflux 07/09/2020   History of basal cell carcinoma (BCC) 07/05/2020   Obesity (BMI 30.0-34.9) 02/09/2018   Anxiety 02/09/2018   Other abnormal glucose 05/23/2015   Medication management 05/23/2015   Vitamin D deficiency 05/23/2015   DDD (degenerative disc disease), cervical 05/23/2015   Essential hypertension 02/01/2010   Hyperlipidemia 01/31/2010   Stacy Gardner, PT 03/27/2211:49 PM   [facility] [address] [place], [place], [address] Phone: [phone]   Fax:  [phone]  Name: Natasha Fritz MRN: [id number] Date of Birth: [birth year]

## 2021-03-27 ENCOUNTER — Ambulatory Visit: Payer: BC Managed Care – PPO | Admitting: Physical Therapy

## 2021-03-27 ENCOUNTER — Encounter: Payer: Self-pay | Admitting: Physical Therapy

## 2021-03-27 DIAGNOSIS — M6281 Muscle weakness (generalized): Secondary | ICD-10-CM | POA: Diagnosis not present

## 2021-03-27 DIAGNOSIS — M79661 Pain in right lower leg: Secondary | ICD-10-CM | POA: Diagnosis not present

## 2021-03-27 DIAGNOSIS — M25571 Pain in right ankle and joints of right foot: Secondary | ICD-10-CM | POA: Diagnosis not present

## 2021-03-27 DIAGNOSIS — R2689 Other abnormalities of gait and mobility: Secondary | ICD-10-CM

## 2021-03-27 NOTE — Therapy (Signed)
[facility] [address] [place], [place], [address] Phone: [phone]   Fax:  [phone]  Physical Therapy Treatment  Patient Details  Name: Natasha Fritz MRN: [id number] Date of Birth: 09-19-1961 Referring Provider (PT): [place], [place], [place]   Encounter Date: 03/27/2021   PT End of Session - 03/27/21 1600     Visit Number 3    Authorization Type [employer]    Authorization - Visit Number 3    Authorization - Number of Visits 90    PT Start Time 5427    PT Stop Time 0623    PT Time Calculation (min) 45 min    Activity Tolerance Patient tolerated treatment well;No increased pain    Behavior During Therapy [facility] for tasks assessed/performed             Past Medical History:  Diagnosis Date   Achilles tendinitis    Acid reflux 07/09/2020   Anxiety    Basal cell carcinoma of nose 05/23/2015   Endometrial hyperplasia without atypia, simple 09/10/2012   Endometrial hyperplasia without atypia, simple 09/10/2012   HSV-1 infection    HTN (hypertension)    Hyperlipidemia    Migraine    PONV (postoperative nausea and vomiting)     Past Surgical History:  Procedure Laterality Date   ACHILLES TENDON SURGERY Right 10/19/2020   Dr. Milinda Pointer   acl replacement     BILATERAL SALPINGECTOMY  09/10/2012   Procedure: BILATERAL SALPINGECTOMY;  Surgeon: Elveria Royals, MD;  Location: [facility];  Service: Gynecology;  Laterality: Bilateral;   CERVICAL DISC SURGERY  2012   fusion, Dr. Joya Salm   COLONOSCOPY  2011   Lionel December release Bilateral 2019   Dr. Burney Gauze    ROBOTIC ASSISTED TOTAL HYSTERECTOMY  09/10/2012   Procedure: ROBOTIC ASSISTED TOTAL HYSTERECTOMY;  Surgeon: Elveria Royals, MD;  Location: [facility];  Service: Gynecology;  Laterality: N/A;  3 hrs.   TUBAL LIGATION  1994    There were no vitals filed for this visit.   Subjective Assessment - 03/27/21 1533     Subjective Pt reports she has been trying to complete HEP  2x per day and reports increased pain in the morning with waking but improves with mobility.    Limitations Walking;Standing    How long can you sit comfortably? no limitations    How long can you walk comfortably? 30 mins    Currently in Pain? Yes    Pain Score 2     Pain Location Ankle    Pain Orientation Right    Pain Descriptors / Indicators Aching    Pain Type Acute pain    Pain Radiating Towards no    Pain Onset More than a month ago    Pain Frequency Intermittent    Aggravating Factors  just being on her feet for increased time and sitting too long    Pain Relieving Factors rest, gentle stretching, ice, medication                               [facility] Adult PT Treatment/Exercise - 03/27/21 0001       Balance   Balance Assessed Yes      Dynamic Standing Balance   Lateral lean/weight shifting comments: x10 on foam    Forward lean/weight shifting comments: x10 on foam and 1 min in static standing on foam      Exercises  Exercises Knee/Hip;Ankle      Knee/Hip Exercises: Aerobic   Recumbent Bike 5 mins L2      Manual Therapy   Manual Therapy Soft tissue mobilization    Manual therapy comments in prone: soft tissue mobilization at scar site and along midline of calf for improved mobility and decreased trigger points x2 in calf found and released. Also in supine: with Rt knee bent, manual stretching for eversion and inversion completed and achilles stretch 2x30s      Ankle Exercises: Stretches   Gastroc Stretch Limitations 5x45s at stairs      Ankle Exercises: Standing   Heel Raises Both   2x10 with BLE and x5 with increased weight bearing at RLE and LLE for balance support                   PT Education - 03/27/21 1556     Education Details Access Code: [email or web address]. pt educated on righting reactions at ankles for improved stability and to take breaks with activity as needed to attempt to decrease pain and pt verbalized understanding.     Person(s) Educated Patient    Methods Explanation;Demonstration;Tactile cues;Verbal cues    Comprehension Verbalized understanding              PT Short Term Goals - 03/26/21 1224       PT SHORT TERM GOAL #1   Title pt to be independent with HEP    Time 6    Period Weeks    Status On-going    Target Date 05/03/21      PT SHORT TERM GOAL #2   Title pt to demonstrate improved strength to Rt ankle to at least 4/5 throughout for improved gait mechanics    Time 6    Period Weeks    Status On-going    Target Date 05/03/21      PT SHORT TERM GOAL #3   Title pt to demonstrate improved bil hip strength to 5/5 throughout for decreased compensatory strategies with squats and single leg activities    Time 6    Period Weeks    Status On-going    Target Date 05/03/21      PT SHORT TERM GOAL #4   Title pt to complete FOTO    Time 2    Period Weeks    Status Achieved    Target Date 04/05/21               PT Long Term Goals - 03/26/21 1225       PT LONG TERM GOAL #1   Title pt to be independent with advanced HEP    Time 4    Period Months    Status On-going      PT LONG TERM GOAL #2   Title pt to demonstrate improved ankle strength to 5/5 for improved gait and stair mechanics    Time 4    Period Months    Status On-going      PT LONG TERM GOAL #3   Title pt to report increased tolerance to walking for at least 1 hour with minimal to no pain for improved distances with community mobility    Time 4    Period Months    Status On-going      PT LONG TERM GOAL #4   Title pt to demonstrate improved ROM at Rt ankle to [facility] ranges for improved ambulation and standing balance    Time 4    Period  Months    Status On-going      PT LONG TERM GOAL #5   Title pt to demonstrate improved dynamic standing balance on Rt ankle without compensatory hip strategies    Time 4    Period Months    Status On-going      Additional Long Term Goals   Additional Long Term Goals Yes       PT LONG TERM GOAL #6   Title pt will have improved FOTO score to 64 for improved functional mobility    Baseline 49    Time 4    Period Months    Status New    Target Date 07/27/21                   Plan - 03/27/21 1602     Clinical Impression Statement Pt presents to clinic with continued decreased ROM and pain at Rt ankle but reports improved mobility and activity levels at home with rest breaks. During session, pt directed in activities for improved strengthening and flexibility at Rt ankle and improved standing balance righting reactions at Rt ankle as well as manual work at Rt ankle at scar site and into calf. Pt would benefit from continued PT for decreased impairments found and to increased QOL and decrease pain .    Personal Factors and Comorbidities Fitness    Examination-Activity Limitations Locomotion Level;Squat;Stairs;Stand;Lift;Carry    Examination-Participation Restrictions Community Activity;Driving;Shop;Yard Work    Stability/Clinical Decision Making Stable/Uncomplicated    Clinical Decision Making Low    PT Frequency 2x / week    PT Duration Other (comment)    PT Treatment/Interventions ADLs/Self Care Home Management;Gait training;Iontophoresis 4mg /ml Dexamethasone;Stair training;Functional mobility training;Therapeutic activities;Therapeutic exercise;Balance training;Neuromuscular re-education;Manual techniques;Patient/family education;Scar mobilization;Passive range of motion;Dry needling;Energy conservation    PT Next Visit Plan go over HEP, ankle ROM and strengthening, standing balance    PT Home Exercise Plan Access Code: [email or web address]    Consulted and Agree with Plan of Care Patient             Patient will benefit from skilled therapeutic intervention in order to improve the following deficits and impairments:  Abnormal gait, Decreased range of motion, Difficulty walking, Increased fascial restricitons, Decreased endurance, Pain, Hypomobility,  Decreased activity tolerance, Decreased balance, Decreased scar mobility, Impaired flexibility, Improper body mechanics, Postural dysfunction, Decreased strength, Decreased mobility  Visit Diagnosis: Pain of right lower leg  Muscle weakness (generalized)  Other abnormalities of gait and mobility     Problem List Patient Active Problem List   Diagnosis Date Noted   Blood pressure check 07/24/2020   History of adenomatous polyp of colon 07/09/2020   Achilles tendinitis of both lower extremities 07/09/2020   Snoring 07/09/2020   Acid reflux 07/09/2020   History of basal cell carcinoma (BCC) 07/05/2020   Obesity (BMI 30.0-34.9) 02/09/2018   Anxiety 02/09/2018   Other abnormal glucose 05/23/2015   Medication management 05/23/2015   Vitamin D deficiency 05/23/2015   DDD (degenerative disc disease), cervical 05/23/2015   Essential hypertension 02/01/2010   Hyperlipidemia 01/31/2010    Natasha Fritz, PT 07/06/225:17 PM   [facility] [address] [place], [place], [address] Phone: [phone]   Fax:  [phone]  Name: Natasha Fritz MRN: [id number] Date of Birth: [birth year]

## 2021-04-01 ENCOUNTER — Ambulatory Visit: Payer: BC Managed Care – PPO | Admitting: Physical Therapy

## 2021-04-01 ENCOUNTER — Encounter: Payer: Self-pay | Admitting: Physical Therapy

## 2021-04-01 ENCOUNTER — Other Ambulatory Visit: Payer: Self-pay

## 2021-04-01 DIAGNOSIS — M25571 Pain in right ankle and joints of right foot: Secondary | ICD-10-CM | POA: Diagnosis not present

## 2021-04-01 DIAGNOSIS — M79661 Pain in right lower leg: Secondary | ICD-10-CM

## 2021-04-01 DIAGNOSIS — R2689 Other abnormalities of gait and mobility: Secondary | ICD-10-CM

## 2021-04-01 DIAGNOSIS — M6281 Muscle weakness (generalized): Secondary | ICD-10-CM | POA: Diagnosis not present

## 2021-04-01 NOTE — Therapy (Signed)
[facility] [address], [place], [place], [address] Phone: [phone]   Fax:  [phone]  Physical Therapy Treatment  Patient Details  Name: KENDLE TURBIN MRN: [id number] Date of Birth: Jun 30, 1961 Referring Provider (PT): [place], [place], [place]   Encounter Date: 04/01/2021   PT End of Session - 04/01/21 1020     Visit Number [place] - Visit Number 4    Authorization - Number of Visits 90    PT Start Time 9381    PT Stop Time 1059    PT Time Calculation (min) 42 min    Activity Tolerance Patient tolerated treatment well;No increased pain    Behavior During Therapy [facility] for tasks assessed/performed             Past Medical History:  Diagnosis Date   Achilles tendinitis    Acid reflux 07/09/2020   Anxiety    Basal cell carcinoma of nose 05/23/2015   Endometrial hyperplasia without atypia, simple 09/10/2012   Endometrial hyperplasia without atypia, simple 09/10/2012   HSV-1 infection    HTN (hypertension)    Hyperlipidemia    Migraine    PONV (postoperative nausea and vomiting)     Past Surgical History:  Procedure Laterality Date   ACHILLES TENDON SURGERY Right 10/19/2020   Dr. Milinda Pointer   acl replacement     BILATERAL SALPINGECTOMY  09/10/2012   Procedure: BILATERAL SALPINGECTOMY;  Surgeon: Elveria Royals, MD;  Location: [facility];  Service: Gynecology;  Laterality: Bilateral;   CERVICAL DISC SURGERY  2012   fusion, Dr. Joya Salm   COLONOSCOPY  2011   Lionel December release Bilateral 2019   Dr. Burney Gauze    ROBOTIC ASSISTED TOTAL HYSTERECTOMY  09/10/2012   Procedure: ROBOTIC ASSISTED TOTAL HYSTERECTOMY;  Surgeon: Elveria Royals, MD;  Location: [facility];  Service: Gynecology;  Laterality: N/A;  3 hrs.   TUBAL LIGATION  1994    There were no vitals filed for this visit.   Subjective Assessment - 04/01/21 1022     Subjective My ankle felt looser for about a day. Just a  little sore this AM.    Currently in Pain? Yes    Pain Score 3     Pain Location Ankle    Pain Orientation Right    Pain Descriptors / Indicators Dull    Aggravating Factors  Being on her feet for a long time    Pain Relieving Factors rest    Multiple Pain Sites No                [facility] PT Assessment - 04/01/21 0001       AROM   Right Ankle Dorsiflexion 16                           [facility] Adult PT Treatment/Exercise - 04/01/21 0001       Manual Therapy   Manual Therapy Soft tissue mobilization    Manual therapy comments [place] post ankle, gastroc both sides, DF Gd 3 joint mobs for DF, deep tissue work to [employer], MFR around achilles      Ankle Exercises: Aerobic   Stationary Bike L1 x5 min VC to maintain >35 RPMS      Ankle Exercises: Standing   Other Standing Ankle Exercises Slant board ankle PF/DF 2 min holding onto TM      Ankle  Exercises: Seated   Other Seated Ankle Exercises Ankle 4 ways: blue PF/DF 15x,                      PT Short Term Goals - 04/01/21 1026       PT SHORT TERM GOAL #1   Title pt to be independent with HEP    Period Weeks    Status Achieved    Target Date 05/03/21               PT Long Term Goals - 03/26/21 1225       PT LONG TERM GOAL #1   Title pt to be independent with advanced HEP    Time 4    Period Months    Status On-going      PT LONG TERM GOAL #2   Title pt to demonstrate improved ankle strength to 5/5 for improved gait and stair mechanics    Time 4    Period Months    Status On-going      PT LONG TERM GOAL #3   Title pt to report increased tolerance to walking for at least 1 hour with minimal to no pain for improved distances with community mobility    Time 4    Period Months    Status On-going      PT LONG TERM GOAL #4   Title pt to demonstrate improved ROM at Rt ankle to [facility] ranges for improved ambulation and standing balance    Time 4    Period Months    Status  On-going      PT LONG TERM GOAL #5   Title pt to demonstrate improved dynamic standing balance on Rt ankle without compensatory hip strategies    Time 4    Period Months    Status On-going      Additional Long Term Goals   Additional Long Term Goals Yes      PT LONG TERM GOAL #6   Title pt will have improved FOTO score to 64 for improved functional mobility    Baseline 49    Time 4    Period Months    Status New    Target Date 07/27/21                   Plan - 04/01/21 1020     Clinical Impression Statement Pt reports feeling "looser" in her ankle for about a day after her last session. She presents with very mild soreness. Pt achieved 16 degrees of active Rt ankle dorsiflexion today, 6 more degrees than last measurement. pt continues to be compliant with HEP, performing her tband 4 ways with excellent form. No increased pain with exercises today, just reports of "i feel the tightness."    Personal Factors and Comorbidities Fitness    Examination-Activity Limitations Locomotion Level;Squat;Stairs;Stand;Lift;Carry    Examination-Participation Restrictions Community Activity;Driving;Shop;Yard Work    Stability/Clinical Decision Making Stable/Uncomplicated    Rehab Potential Good    PT Frequency 2x / week    PT Duration Other (comment)    PT Treatment/Interventions ADLs/Self Care Home Management;Gait training;Iontophoresis 4mg /ml Dexamethasone;Stair training;Functional mobility training;Therapeutic activities;Therapeutic exercise;Balance training;Neuromuscular re-education;Manual techniques;Patient/family education;Scar mobilization;Passive range of motion;Dry needling;Energy conservation    PT Next Visit Plan Maximize ankle DF ROM, continue with strength and standing balance    PT Home Exercise Plan Access Code: [email or web address]    Consulted and Agree with Plan of Care Patient  Patient will benefit from skilled therapeutic intervention in order to improve the  following deficits and impairments:  Abnormal gait, Decreased range of motion, Difficulty walking, Increased fascial restricitons, Decreased endurance, Pain, Hypomobility, Decreased activity tolerance, Decreased balance, Decreased scar mobility, Impaired flexibility, Improper body mechanics, Postural dysfunction, Decreased strength, Decreased mobility  Visit Diagnosis: Pain of right lower leg  Other abnormalities of gait and mobility  Muscle weakness (generalized)  Pain in right ankle and joints of right foot     Problem List Patient Active Problem List   Diagnosis Date Noted   Blood pressure check 07/24/2020   History of adenomatous polyp of colon 07/09/2020   Achilles tendinitis of both lower extremities 07/09/2020   Snoring 07/09/2020   Acid reflux 07/09/2020   History of basal cell carcinoma (BCC) 07/05/2020   Obesity (BMI 30.0-34.9) 02/09/2018   Anxiety 02/09/2018   Other abnormal glucose 05/23/2015   Medication management 05/23/2015   Vitamin D deficiency 05/23/2015   DDD (degenerative disc disease), cervical 05/23/2015   Essential hypertension 02/01/2010   Hyperlipidemia 01/31/2010    Treazure Nery, PTA 04/01/2021, 11:02 AM  [facility] [address] [place], [place], [address] Phone: [phone]   Fax:  [phone]  Name: SREENIDHI GANSON MRN: [id number] Date of Birth: 04-10-1961

## 2021-04-03 ENCOUNTER — Encounter: Payer: Self-pay | Admitting: Physical Therapy

## 2021-04-03 ENCOUNTER — Ambulatory Visit: Payer: BC Managed Care – PPO | Admitting: Physical Therapy

## 2021-04-03 ENCOUNTER — Other Ambulatory Visit: Payer: Self-pay

## 2021-04-03 DIAGNOSIS — M25571 Pain in right ankle and joints of right foot: Secondary | ICD-10-CM | POA: Diagnosis not present

## 2021-04-03 DIAGNOSIS — M79661 Pain in right lower leg: Secondary | ICD-10-CM | POA: Diagnosis not present

## 2021-04-03 DIAGNOSIS — R2689 Other abnormalities of gait and mobility: Secondary | ICD-10-CM | POA: Diagnosis not present

## 2021-04-03 DIAGNOSIS — M6281 Muscle weakness (generalized): Secondary | ICD-10-CM

## 2021-04-03 NOTE — Therapy (Signed)
[facility] [address], [place], [place], [address] Phone: [phone]   Fax:  [phone]  Physical Therapy Treatment  Patient Details  Name: Natasha Fritz MRN: [id number] Date of Birth: 02/17/1961 Referring Provider (PT): [place], [place], [place]   Encounter Date: 04/03/2021   PT End of Session - 04/03/21 1237     Visit Number [place] - Visit Number 5    Authorization - Number of Visits 90    PT Start Time 0093    PT Stop Time 8182    PT Time Calculation (min) 47 min    Activity Tolerance Patient tolerated treatment well;No increased pain    Behavior During Therapy [facility] for tasks assessed/performed             Past Medical History:  Diagnosis Date   Achilles tendinitis    Acid reflux 07/09/2020   Anxiety    Basal cell carcinoma of nose 05/23/2015   Endometrial hyperplasia without atypia, simple 09/10/2012   Endometrial hyperplasia without atypia, simple 09/10/2012   HSV-1 infection    HTN (hypertension)    Hyperlipidemia    Migraine    PONV (postoperative nausea and vomiting)     Past Surgical History:  Procedure Laterality Date   ACHILLES TENDON SURGERY Right 10/19/2020   Dr. Milinda Pointer   acl replacement     BILATERAL SALPINGECTOMY  09/10/2012   Procedure: BILATERAL SALPINGECTOMY;  Surgeon: Elveria Royals, MD;  Location: [facility];  Service: Gynecology;  Laterality: Bilateral;   CERVICAL DISC SURGERY  2012   fusion, Dr. Joya Salm   COLONOSCOPY  2011   Lionel December release Bilateral 2019   Dr. Burney Gauze    ROBOTIC ASSISTED TOTAL HYSTERECTOMY  09/10/2012   Procedure: ROBOTIC ASSISTED TOTAL HYSTERECTOMY;  Surgeon: Elveria Royals, MD;  Location: [facility];  Service: Gynecology;  Laterality: N/A;  3 hrs.   TUBAL LIGATION  1994    There were no vitals filed for this visit.   Subjective Assessment - 04/03/21 1149     Subjective Pt reports her ankle feels looser and that she  is able to do a little more activity than before.    Limitations Walking;Standing    How long can you sit comfortably? no limitations    How long can you walk comfortably? 30 mins    Currently in Pain? Yes    Pain Score 2     Pain Location Ankle    Pain Orientation Right    Pain Descriptors / Indicators Dull;Aching    Pain Type Acute pain    Pain Radiating Towards no    Pain Onset More than a month ago    Pain Frequency Intermittent    Aggravating Factors  increased standing and walking time    Pain Relieving Factors rest                               [facility] Adult PT Treatment/Exercise - 04/03/21 0001       Dynamic Standing Balance   Lateral lean/weight shifting comments: x10 each onto bosu ball      Exercises   Exercises Ankle;Knee/Hip;Other Exercises;Lumbar      Lumbar Exercises: Standing   Functional Squats 20 reps   10# kettlebell VC for weight shifting to Rt LE     Knee/Hip Exercises: Stretches   Gastroc Stretch Right;30 seconds;5 reps  in long sitting with strap to assist and in standing at slant board     Manual Therapy   Manual Therapy Soft tissue mobilization    Manual therapy comments grade 2-3 joint mobilization for DF and manual work at posterior scar site for scor mobilization and decreased pain at Rt side of scar    Soft tissue mobilization foam roller at Rt calf for improved tissue mobility. x10      Ankle Exercises: Standing   SLS x10 with 10s SLS holds with intermittent external support with one UE    Other Standing Ankle Exercises anterior/posterior and Rt/Lt on foam 2x10 each    Other Standing Ankle Exercises bosu ball forward lunge on Rt and Lt x10 each without external support                    PT Education - 04/03/21 1236     Education Details Access Code: [email or web address]. 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 Patient    Methods Explanation;Demonstration;Tactile cues;Verbal cues    Comprehension Verbalized understanding;Returned demonstration              PT Short Term Goals - 04/01/21 1026       PT SHORT TERM GOAL #1   Title pt to be independent with HEP    Period Weeks    Status Achieved    Target Date 05/03/21               PT Long Term Goals - 03/26/21 1225       PT LONG TERM GOAL #1   Title pt to be independent with advanced HEP    Time 4    Period Months    Status On-going      PT LONG TERM GOAL #2   Title pt to demonstrate improved ankle strength to 5/5 for improved gait and stair mechanics    Time 4    Period Months    Status On-going      PT LONG TERM GOAL #3   Title pt to report increased tolerance to walking for at least 1 hour with minimal to no pain for improved distances with community mobility    Time 4    Period Months    Status On-going      PT LONG TERM GOAL #4   Title pt to demonstrate improved ROM at Rt ankle to [facility] ranges for improved ambulation and standing balance    Time 4    Period Months    Status On-going      PT LONG TERM GOAL #5   Title pt to demonstrate improved dynamic standing balance on Rt ankle without compensatory hip strategies    Time 4    Period Months    Status On-going      Additional Long Term Goals   Additional Long Term Goals Yes      PT LONG TERM GOAL #6   Title pt will have improved FOTO score to 64 for improved functional mobility    Baseline 49    Time 4    Period Months    Status New    Target Date 07/27/21                   Plan - 04/03/21  1238     Clinical Impression Statement Pt reported feeling better after session stating "I feel like I worked out but no extra pain and it feels looser." Pt continues to complete HEP and participates fully in all activity. Pt continues to improve with range and mobilty  in general with ankle but would benefit from continued PT for increased strength, stability in ankle, improved balance righting reactions, improved tolerance to activity and decreased pain.    Personal Factors and Comorbidities Fitness    Examination-Activity Limitations Locomotion Level;Squat;Stairs;Stand;Lift;Carry    Examination-Participation Restrictions Community Activity;Driving;Shop;Yard Work    Stability/Clinical Decision Making Stable/Uncomplicated    Clinical Decision Making Low    Rehab Potential Good    PT Frequency 2x / week    PT Duration Other (comment)    PT Treatment/Interventions ADLs/Self Care Home Management;Gait training;Iontophoresis 4mg /ml Dexamethasone;Stair training;Functional mobility training;Therapeutic activities;Therapeutic exercise;Balance training;Neuromuscular re-education;Manual techniques;Patient/family education;Scar mobilization;Passive range of motion;Dry needling;Energy conservation    PT Next Visit Plan Maximize ankle DF ROM, continue with strength and standing balance    PT Home Exercise Plan Access Code: [email or web address]    Consulted and Agree with Plan of Care Patient             Patient will benefit from skilled therapeutic intervention in order to improve the following deficits and impairments:  Abnormal gait, Decreased range of motion, Difficulty walking, Increased fascial restricitons, Decreased endurance, Pain, Hypomobility, Decreased activity tolerance, Decreased balance, Decreased scar mobility, Impaired flexibility, Improper body mechanics, Postural dysfunction, Decreased strength, Decreased mobility  Visit Diagnosis: Muscle weakness (generalized)  Other abnormalities of gait and mobility     Problem List Patient Active Problem List   Diagnosis Date Noted   Blood pressure check 07/24/2020   History of adenomatous polyp of colon 07/09/2020   Achilles tendinitis of both lower extremities 07/09/2020   Snoring 07/09/2020   Acid reflux  07/09/2020   History of basal cell carcinoma (BCC) 07/05/2020   Obesity (BMI 30.0-34.9) 02/09/2018   Anxiety 02/09/2018   Other abnormal glucose 05/23/2015   Medication management 05/23/2015   Vitamin D deficiency 05/23/2015   DDD (degenerative disc disease), cervical 05/23/2015   Essential hypertension 02/01/2010   Hyperlipidemia 01/31/2010    Stacy Gardner, PT 04/04/2211:42 PM   [facility] [address] [place], [place], [address] Phone: [phone]   Fax:  [phone]  Name: Natasha Fritz MRN: [id number] Date of Birth: June 14, 1961

## 2021-04-04 ENCOUNTER — Other Ambulatory Visit (HOSPITAL_COMMUNITY): Payer: Self-pay

## 2021-04-08 ENCOUNTER — Encounter: Payer: BC Managed Care – PPO | Admitting: Physical Therapy

## 2021-04-10 ENCOUNTER — Encounter: Payer: BC Managed Care – PPO | Admitting: Physical Therapy

## 2021-04-15 ENCOUNTER — Encounter: Payer: BC Managed Care – PPO | Admitting: Physical Therapy

## 2021-04-17 ENCOUNTER — Encounter: Payer: BC Managed Care – PPO | Admitting: Physical Therapy

## 2021-04-22 ENCOUNTER — Ambulatory Visit: Payer: BC Managed Care – PPO | Admitting: Physical Therapy

## 2021-04-24 ENCOUNTER — Ambulatory Visit: Payer: BC Managed Care – PPO | Admitting: Physical Therapy

## 2021-05-01 ENCOUNTER — Ambulatory Visit: Payer: BC Managed Care – PPO | Attending: Podiatry | Admitting: Physical Therapy

## 2021-05-01 ENCOUNTER — Other Ambulatory Visit: Payer: Self-pay

## 2021-05-01 DIAGNOSIS — R279 Unspecified lack of coordination: Secondary | ICD-10-CM | POA: Diagnosis not present

## 2021-05-01 DIAGNOSIS — M79661 Pain in right lower leg: Secondary | ICD-10-CM | POA: Insufficient documentation

## 2021-05-01 DIAGNOSIS — R2689 Other abnormalities of gait and mobility: Secondary | ICD-10-CM | POA: Diagnosis not present

## 2021-05-01 DIAGNOSIS — M6281 Muscle weakness (generalized): Secondary | ICD-10-CM | POA: Insufficient documentation

## 2021-05-01 DIAGNOSIS — M25571 Pain in right ankle and joints of right foot: Secondary | ICD-10-CM | POA: Diagnosis not present

## 2021-05-01 NOTE — Therapy (Signed)
[facility] [address], [place], [place], [address] Phone: [phone]   Fax:  [phone]  Physical Therapy Treatment  Patient Details  Name: LUTECE MABEY MRN: [id number] Date of Birth: [birth year] Referring Provider (PT): [place], [place], [place]   Encounter Date: 05/01/2021   PT End of Session - 05/01/21 1400     Visit Number 6    Date for PT Re-Evaluation 07/23/21    Authorization Type BCBS    Authorization - Visit Number 6    Authorization - Number of Visits 90    PT Start Time [id number]    PT Stop Time 1230    PT Time Calculation (min) 45 min    Activity Tolerance Patient tolerated treatment well;No increased pain    Behavior During Therapy [facility] for tasks assessed/performed             Past Medical History:  Diagnosis Date   Achilles tendinitis    Acid reflux 07/09/2020   Anxiety    Basal cell carcinoma of nose 05/23/2015   Endometrial hyperplasia without atypia, simple 09/10/2012   Endometrial hyperplasia without atypia, simple 09/10/2012   HSV-1 infection    HTN (hypertension)    Hyperlipidemia    Migraine    PONV (postoperative nausea and vomiting)     Past Surgical History:  Procedure Laterality Date   ACHILLES TENDON SURGERY Right 10/19/2020   Dr. Milinda Pointer   acl replacement     BILATERAL SALPINGECTOMY  09/10/2012   Procedure: BILATERAL SALPINGECTOMY;  Surgeon: Elveria Royals, MD;  Location: [facility];  Service: Gynecology;  Laterality: Bilateral;   CERVICAL DISC SURGERY  2012   fusion, Dr. Joya Salm   COLONOSCOPY  2011   Lionel December release Bilateral 2019   Dr. Burney Gauze    ROBOTIC ASSISTED TOTAL HYSTERECTOMY  09/10/2012   Procedure: ROBOTIC ASSISTED TOTAL HYSTERECTOMY;  Surgeon: Elveria Royals, MD;  Location: [facility];  Service: Gynecology;  Laterality: N/A;  3 hrs.   TUBAL LIGATION  1994    There were no vitals filed for this visit.   Subjective Assessment - 05/01/21 1148     Subjective Pt  reports her Rt ankle has been stronger and feels more flexible and has been able to walk longer distances.    Limitations Walking;Standing    How long can you sit comfortably? no limitations    How long can you walk comfortably? ~.75-1 min with hills during walking outdoors    Currently in Pain? Yes    Pain Score 2     Pain Location Ankle    Pain Orientation Right    Pain Descriptors / Indicators Aching    Pain Type Chronic pain    Pain Radiating Towards no    Pain Onset More than a month ago    Pain Frequency Constant                               [facility] Adult PT Treatment/Exercise - 05/01/21 0001       Exercises   Exercises Ankle;Knee/Hip      Manual Therapy   Manual Therapy Soft tissue mobilization    Manual therapy comments grade 2-3 joint mobilization for DF and manual work at posterior scar site for scar mobilization and decreased pain at Rt side of scar, trigger point release in mid calf as well      Ankle Exercises: Standing   Heel Raises  Both;20 reps    Other Standing Ankle Exercises anterior/posterior and Rt/Lt on foam 2x10 each    Other Standing Ankle Exercises --      Ankle Exercises: Seated   Other Seated Ankle Exercises Ankle 4 ways: PF/DF/eversion and inversion x20                    PT Education - 05/01/21 1400     Education Details Pt educated on updated HEP, mobility and technique for all exercises    Person(s) Educated Patient    Methods Explanation;Demonstration;Tactile cues;Verbal cues    Comprehension Verbalized understanding;Returned demonstration              PT Short Term Goals - 04/01/21 1026       PT SHORT TERM GOAL #1   Title pt to be independent with HEP    Period Weeks    Status Achieved    Target Date 05/03/21               PT Long Term Goals - 03/26/21 1225       PT LONG TERM GOAL #1   Title pt to be independent with advanced HEP    Time 4    Period Months    Status On-going      PT  LONG TERM GOAL #2   Title pt to demonstrate improved ankle strength to 5/5 for improved gait and stair mechanics    Time 4    Period Months    Status On-going      PT LONG TERM GOAL #3   Title pt to report increased tolerance to walking for at least 1 hour with minimal to no pain for improved distances with community mobility    Time 4    Period Months    Status On-going      PT LONG TERM GOAL #4   Title pt to demonstrate improved ROM at Rt ankle to [facility] ranges for improved ambulation and standing balance    Time 4    Period Months    Status On-going      PT LONG TERM GOAL #5   Title pt to demonstrate improved dynamic standing balance on Rt ankle without compensatory hip strategies    Time 4    Period Months    Status On-going      Additional Long Term Goals   Additional Long Term Goals Yes      PT LONG TERM GOAL #6   Title pt will have improved FOTO score to 64 for improved functional mobility    Baseline 49    Time 4    Period Months    Status New    Target Date 07/27/21                   Plan - 05/01/21 1442     Clinical Impression Statement Pt reports feeling better and that she is now able to walk longer distances and possibly at a faster pace for ~30-45 mins and feels looser. Pt continues to complete HEP and does still have some swelling but improved with mobility and elevation. Pt able to complete all tasks and activities however reports she still feels like she has constant nagging pain but better than before. Pt session focused on mobility at ankle and manual work at Rt ankle for improved mobility and decreased pain levels. Pt continues to improve with range and mobilty in general with ankle but would benefit from continued PT  for increased strength, stability in ankle, improved balance righting reactions, improved tolerance to activity and decreased pain.    Personal Factors and Comorbidities Fitness    Examination-Activity Limitations Locomotion  Level;Squat;Stairs;Stand;Lift;Carry    Examination-Participation Restrictions Community Activity;Driving;Shop;Yard Work    Stability/Clinical Decision Making Stable/Uncomplicated    Clinical Decision Making Low    Rehab Potential Good    PT Frequency 2x / week    PT Duration Other (comment)    PT Treatment/Interventions ADLs/Self Care Home Management;Gait training;Iontophoresis '4mg'$ /ml Dexamethasone;Stair training;Functional mobility training;Therapeutic activities;Therapeutic exercise;Balance training;Neuromuscular re-education;Manual techniques;Patient/family education;Scar mobilization;Passive range of motion;Dry needling;Energy conservation;Aquatic Therapy    PT Next Visit Plan Maximize ankle DF ROM, continue with strength and standing balance    PT Home Exercise Plan Access Code: [email or web address]    Consulted and Agree with Plan of Care Patient             Patient will benefit from skilled therapeutic intervention in order to improve the following deficits and impairments:  Abnormal gait, Decreased range of motion, Difficulty walking, Increased fascial restricitons, Decreased endurance, Pain, Hypomobility, Decreased activity tolerance, Decreased balance, Decreased scar mobility, Impaired flexibility, Improper body mechanics, Postural dysfunction, Decreased strength, Decreased mobility  Visit Diagnosis: Other abnormalities of gait and mobility  Muscle weakness (generalized)  Pain in right ankle and joints of right foot     Problem List Patient Active Problem List   Diagnosis Date Noted   Blood pressure check 07/24/2020   History of adenomatous polyp of colon 07/09/2020   Achilles tendinitis of both lower extremities 07/09/2020   Snoring 07/09/2020   Acid reflux 07/09/2020   History of basal cell carcinoma (BCC) 07/05/2020   Obesity (BMI 30.0-34.9) 02/09/2018   Anxiety 02/09/2018   Other abnormal glucose 05/23/2015   Medication management 05/23/2015   Vitamin D deficiency  05/23/2015   DDD (degenerative disc disease), cervical 05/23/2015   Essential hypertension 02/01/2010   Hyperlipidemia 01/31/2010    Stacy Gardner, PT 08/10/222:50 PM   [facility] [address] [place], [place], [address] Phone: [phone]   Fax:  [phone]  Name: INGEBORG SCHRACK MRN: [id number] Date of Birth: 1961-07-29

## 2021-05-06 ENCOUNTER — Other Ambulatory Visit: Payer: Self-pay

## 2021-05-06 ENCOUNTER — Ambulatory Visit: Payer: BC Managed Care – PPO | Admitting: Physical Therapy

## 2021-05-06 ENCOUNTER — Encounter: Payer: Self-pay | Admitting: Physical Therapy

## 2021-05-06 DIAGNOSIS — R2689 Other abnormalities of gait and mobility: Secondary | ICD-10-CM

## 2021-05-06 DIAGNOSIS — M79661 Pain in right lower leg: Secondary | ICD-10-CM

## 2021-05-06 DIAGNOSIS — M25571 Pain in right ankle and joints of right foot: Secondary | ICD-10-CM | POA: Diagnosis not present

## 2021-05-06 DIAGNOSIS — M6281 Muscle weakness (generalized): Secondary | ICD-10-CM | POA: Diagnosis not present

## 2021-05-06 DIAGNOSIS — R279 Unspecified lack of coordination: Secondary | ICD-10-CM | POA: Diagnosis not present

## 2021-05-06 NOTE — Therapy (Signed)
[facility] [address], [place], [place], [address] Phone: [phone]   Fax:  [phone]  Physical Therapy Treatment  Patient Details  Name: ANAIJA SORRENTI MRN: [id number] Date of Birth: 1961-05-16 Referring Provider (PT): [place], [place], [place]   Encounter Date: 05/06/2021   PT End of Session - 05/06/21 1151     Visit Number 7    Date for PT Re-Evaluation 07/23/21    Authorization Type BCBS    Authorization - Visit Number 7    Authorization - Number of Visits 90    PT Start Time [id number]    PT Stop Time 1229    PT Time Calculation (min) 38 min    Activity Tolerance Patient tolerated treatment well;No increased pain    Behavior During Therapy [facility] for tasks assessed/performed             Past Medical History:  Diagnosis Date   Achilles tendinitis    Acid reflux 07/09/2020   Anxiety    Basal cell carcinoma of nose 05/23/2015   Endometrial hyperplasia without atypia, simple 09/10/2012   Endometrial hyperplasia without atypia, simple 09/10/2012   HSV-1 infection    HTN (hypertension)    Hyperlipidemia    Migraine    PONV (postoperative nausea and vomiting)     Past Surgical History:  Procedure Laterality Date   ACHILLES TENDON SURGERY Right 10/19/2020   Dr. Milinda Pointer   acl replacement     BILATERAL SALPINGECTOMY  09/10/2012   Procedure: BILATERAL SALPINGECTOMY;  Surgeon: Elveria Royals, MD;  Location: [facility];  Service: Gynecology;  Laterality: Bilateral;   CERVICAL DISC SURGERY  2012   fusion, Dr. Joya Salm   COLONOSCOPY  2011   Lionel December release Bilateral 2019   Dr. Burney Gauze    ROBOTIC ASSISTED TOTAL HYSTERECTOMY  09/10/2012   Procedure: ROBOTIC ASSISTED TOTAL HYSTERECTOMY;  Surgeon: Elveria Royals, MD;  Location: [facility];  Service: Gynecology;  Laterality: N/A;  3 hrs.   TUBAL LIGATION  1994    There were no vitals filed for this visit.   Subjective Assessment - 05/06/21 1152     Subjective Was busy  this weekend, noticed popping ( no-painful)    Currently in Pain? Yes   Tired feeling   Pain Orientation Right    Multiple Pain Sites No                               [facility] Adult PT Treatment/Exercise - 05/06/21 0001       Knee/Hip Exercises: Machines for Strengthening   Cybex Leg Press Seatr 6 Bil 70# 15x focus on keeping heels on platform: heel raises Bil 50# 15x      Knee/Hip Exercises: Standing   Functional Squat 2 sets;10 reps    Functional Squat Limitations standing on black pad      Ankle Exercises: Stretches   Other Stretch DF stretch on second step 10x DF then hold 10 sec 5x      Ankle Exercises: Standing   SLS on black pad 3x30 sec    Rebounder lateral bounding 30 sec 3x   Forward/back bounding 30 sec 3x   Heel Raises Both;20 reps   Attempted RTLE could only do 1x,  Toe down on LT for support/assitance 2x5     Ankle Exercises: Aerobic   Elliptical R3 L4 x 5 min PTA present  PT Short Term Goals - 04/01/21 1026       PT SHORT TERM GOAL #1   Title pt to be independent with HEP    Period Weeks    Status Achieved    Target Date 05/03/21               PT Long Term Goals - 03/26/21 1225       PT LONG TERM GOAL #1   Title pt to be independent with advanced HEP    Time 4    Period Months    Status On-going      PT LONG TERM GOAL #2   Title pt to demonstrate improved ankle strength to 5/5 for improved gait and stair mechanics    Time 4    Period Months    Status On-going      PT LONG TERM GOAL #3   Title pt to report increased tolerance to walking for at least 1 hour with minimal to no pain for improved distances with community mobility    Time 4    Period Months    Status On-going      PT LONG TERM GOAL #4   Title pt to demonstrate improved ROM at Rt ankle to [facility] ranges for improved ambulation and standing balance    Time 4    Period Months    Status On-going      PT LONG TERM GOAL #5    Title pt to demonstrate improved dynamic standing balance on Rt ankle without compensatory hip strategies    Time 4    Period Months    Status On-going      Additional Long Term Goals   Additional Long Term Goals Yes      PT LONG TERM GOAL #6   Title pt will have improved FOTO score to 64 for improved functional mobility    Baseline 49    Time 4    Period Months    Status New    Target Date 07/27/21                   Plan - 05/06/21 1154     Clinical Impression Statement Pt just returned from the beach and reports doing well walking in the sand. pt presnets with a "tired feeling" ankle today, denies pain. treatment focused on dynamic mobility and strength. RT gastroc fatigues quickly. Pt could not do a single leg heel lift but required some assistance of the LT foot. PTA suggestd pt practice her heel lifts at home 60/40 RT/LT. Pt agreed.    Personal Factors and Comorbidities Fitness    Examination-Activity Limitations Locomotion Level;Squat;Stairs;Stand;Lift;Carry    Examination-Participation Restrictions Community Activity;Driving;Shop;Yard Work    Stability/Clinical Decision Making Stable/Uncomplicated    Rehab Potential Good    PT Frequency 2x / week    PT Duration Other (comment)    PT Treatment/Interventions ADLs/Self Care Home Management;Gait training;Iontophoresis '4mg'$ /ml Dexamethasone;Stair training;Functional mobility training;Therapeutic activities;Therapeutic exercise;Balance training;Neuromuscular re-education;Manual techniques;Patient/family education;Scar mobilization;Passive range of motion;Dry needling;Energy conservation;Aquatic Therapy    PT Next Visit Plan Maximize ankle DF ROM, continue with strength and standing balance    PT Home Exercise Plan Access Code: [email or web address]    Consulted and Agree with Plan of Care Patient             Patient will benefit from skilled therapeutic intervention in order to improve the following deficits and impairments:   Abnormal gait, Decreased range of motion, Difficulty walking, Increased fascial restricitons,  Decreased endurance, Pain, Hypomobility, Decreased activity tolerance, Decreased balance, Decreased scar mobility, Impaired flexibility, Improper body mechanics, Postural dysfunction, Decreased strength, Decreased mobility  Visit Diagnosis: Other abnormalities of gait and mobility  Muscle weakness (generalized)  Pain in right ankle and joints of right foot  Pain of right lower leg     Problem List Patient Active Problem List   Diagnosis Date Noted   Blood pressure check 07/24/2020   History of adenomatous polyp of colon 07/09/2020   Achilles tendinitis of both lower extremities 07/09/2020   Snoring 07/09/2020   Acid reflux 07/09/2020   History of basal cell carcinoma (BCC) 07/05/2020   Obesity (BMI 30.0-34.9) 02/09/2018   Anxiety 02/09/2018   Other abnormal glucose 05/23/2015   Medication management 05/23/2015   Vitamin D deficiency 05/23/2015   DDD (degenerative disc disease), cervical 05/23/2015   Essential hypertension 02/01/2010   Hyperlipidemia 01/31/2010    Tyreek Clabo, PTA 05/06/2021, 12:33 PM  [facility] [address], [place], [place], [address] Phone: [phone]   Fax:  [phone]  Name: MARYSA HIRAYAMA MRN: [id number] Date of Birth: 1961-06-17

## 2021-05-08 ENCOUNTER — Ambulatory Visit: Payer: BC Managed Care – PPO | Admitting: Physical Therapy

## 2021-05-08 ENCOUNTER — Other Ambulatory Visit: Payer: Self-pay

## 2021-05-08 DIAGNOSIS — M79661 Pain in right lower leg: Secondary | ICD-10-CM | POA: Diagnosis not present

## 2021-05-08 DIAGNOSIS — R279 Unspecified lack of coordination: Secondary | ICD-10-CM | POA: Diagnosis not present

## 2021-05-08 DIAGNOSIS — M6281 Muscle weakness (generalized): Secondary | ICD-10-CM | POA: Diagnosis not present

## 2021-05-08 DIAGNOSIS — M25571 Pain in right ankle and joints of right foot: Secondary | ICD-10-CM | POA: Diagnosis not present

## 2021-05-08 DIAGNOSIS — R2689 Other abnormalities of gait and mobility: Secondary | ICD-10-CM

## 2021-05-08 NOTE — Therapy (Signed)
[facility] [address], [place], [place], [address] Phone: [phone]   Fax:  [phone]  Physical Therapy Treatment  Patient Details  Name: Natasha Fritz MRN: [id number] Date of Birth: December 26, 1960 Referring Provider (PT): [place], [place] T, [place]   Encounter Date: 05/08/2021   PT End of Session - 05/08/21 1230     Visit Number 8    Date for PT Re-Evaluation 07/23/21    Authorization Type BCBS    Authorization - Visit Number 8    Authorization - Number of Visits 90    PT Start Time [id number]    PT Stop Time 1228    PT Time Calculation (min) 42 min    Activity Tolerance Patient tolerated treatment well;No increased pain    Behavior During Therapy [facility] for tasks assessed/performed             Past Medical History:  Diagnosis Date   Achilles tendinitis    Acid reflux 07/09/2020   Anxiety    Basal cell carcinoma of nose 05/23/2015   Endometrial hyperplasia without atypia, simple 09/10/2012   Endometrial hyperplasia without atypia, simple 09/10/2012   HSV-1 infection    HTN (hypertension)    Hyperlipidemia    Migraine    PONV (postoperative nausea and vomiting)     Past Surgical History:  Procedure Laterality Date   ACHILLES TENDON SURGERY Right 10/19/2020   Dr. Milinda Pointer   acl replacement     BILATERAL SALPINGECTOMY  09/10/2012   Procedure: BILATERAL SALPINGECTOMY;  Surgeon: Elveria Royals, MD;  Location: [facility];  Service: Gynecology;  Laterality: Bilateral;   CERVICAL DISC SURGERY  2012   fusion, Dr. Joya Salm   COLONOSCOPY  2011   Lionel December release Bilateral 2019   Dr. Burney Gauze    ROBOTIC ASSISTED TOTAL HYSTERECTOMY  09/10/2012   Procedure: ROBOTIC ASSISTED TOTAL HYSTERECTOMY;  Surgeon: Elveria Royals, MD;  Location: [facility];  Service: Gynecology;  Laterality: N/A;  3 hrs.   TUBAL LIGATION  1994    There were no vitals filed for this visit.   Subjective Assessment - 05/08/21 1200     Subjective Pt  reports she has been working on single leg stance at home however not able to complete fully yet on Rt ankle, improving with distance of walking and time on feet    Limitations Walking;Standing    Currently in Pain? Yes    Pain Score 3     Pain Location Ankle    Pain Orientation Right    Pain Descriptors / Indicators Aching    Pain Type Chronic pain    Pain Onset More than a month ago    Pain Frequency Constant                               [facility] Adult PT Treatment/Exercise - 05/08/21 0001       Exercises   Exercises Knee/Hip;Ankle      Ankle Exercises: Aerobic   Elliptical R3 L4 x 5 min PT present      Ankle Exercises: Standing   SLS on black pad 3x30 sec    Heel Raises --    Other Standing Ankle Exercises anterior/posterior and Rt/Lt on foam 2x10 each    Other Standing Ankle Exercises SLS on ground without UE support 5x10s; calf stretches 3x30s each      Ankle Exercises: Seated   Other Seated Ankle Exercises Ankle  4 ways: PF/DF/eversion and inversion x15 blue band                    PT Education - 05/08/21 1230     Education Details Verbal cues given for proper technique throughout session    Person(s) Educated Patient    Methods Explanation;Demonstration;Tactile cues;Verbal cues    Comprehension Verbalized understanding;Returned demonstration              PT Short Term Goals - 04/01/21 1026       PT SHORT TERM GOAL #1   Title pt to be independent with HEP    Period Weeks    Status Achieved    Target Date 05/03/21               PT Long Term Goals - 03/26/21 1225       PT LONG TERM GOAL #1   Title pt to be independent with advanced HEP    Time 4    Period Months    Status On-going      PT LONG TERM GOAL #2   Title pt to demonstrate improved ankle strength to 5/5 for improved gait and stair mechanics    Time 4    Period Months    Status On-going      PT LONG TERM GOAL #3   Title pt to report increased  tolerance to walking for at least 1 hour with minimal to no pain for improved distances with community mobility    Time 4    Period Months    Status On-going      PT LONG TERM GOAL #4   Title pt to demonstrate improved ROM at Rt ankle to [facility] ranges for improved ambulation and standing balance    Time 4    Period Months    Status On-going      PT LONG TERM GOAL #5   Title pt to demonstrate improved dynamic standing balance on Rt ankle without compensatory hip strategies    Time 4    Period Months    Status On-going      Additional Long Term Goals   Additional Long Term Goals Yes      PT LONG TERM GOAL #6   Title pt will have improved FOTO score to 64 for improved functional mobility    Baseline 49    Time 4    Period Months    Status New    Target Date 07/27/21                   Plan - 05/08/21 1231     Clinical Impression Statement Pt presents reporting she feels like she is doing better and notes she is able to stand and walk longer distances and quicker pace and able to attempt more single leg standing with UE support at home. Pt session focused on strengthening on ankle and stability training. Pt unable to do single leg heel lift but able to stand statically on Rt leg without UE support with cues for 10s at a time. Pt would benefit from continued PT to maximize ankle ROM and stability and promote return to PLOF.    Personal Factors and Comorbidities Fitness    Examination-Activity Limitations Locomotion Level;Squat;Stairs;Stand;Lift;Carry    Examination-Participation Restrictions Community Activity;Driving;Shop;Yard Work    Stability/Clinical Decision Making Stable/Uncomplicated    Clinical Decision Making Low    Rehab Potential Good    PT Frequency 2x / week  PT Duration Other (comment)    PT Treatment/Interventions ADLs/Self Care Home Management;Gait training;Iontophoresis '4mg'$ /ml Dexamethasone;Stair training;Functional mobility training;Therapeutic  activities;Therapeutic exercise;Balance training;Neuromuscular re-education;Manual techniques;Patient/family education;Scar mobilization;Passive range of motion;Dry needling;Energy conservation;Aquatic Therapy    PT Next Visit Plan single leg stance, balance, hip strength    PT Home Exercise Plan Access Code: [email or web address]    Consulted and Agree with Plan of Care Patient             Patient will benefit from skilled therapeutic intervention in order to improve the following deficits and impairments:  Abnormal gait, Decreased range of motion, Difficulty walking, Increased fascial restricitons, Decreased endurance, Pain, Hypomobility, Decreased activity tolerance, Decreased balance, Decreased scar mobility, Impaired flexibility, Improper body mechanics, Postural dysfunction, Decreased strength, Decreased mobility  Visit Diagnosis: Muscle weakness (generalized)  Lack of coordination  Other abnormalities of gait and mobility     Problem List Patient Active Problem List   Diagnosis Date Noted   Blood pressure check 07/24/2020   History of adenomatous polyp of colon 07/09/2020   Achilles tendinitis of both lower extremities 07/09/2020   Snoring 07/09/2020   Acid reflux 07/09/2020   History of basal cell carcinoma (BCC) 07/05/2020   Obesity (BMI 30.0-34.9) 02/09/2018   Anxiety 02/09/2018   Other abnormal glucose 05/23/2015   Medication management 05/23/2015   Vitamin D deficiency 05/23/2015   DDD (degenerative disc disease), cervical 05/23/2015   Essential hypertension 02/01/2010   Hyperlipidemia 01/31/2010    Stacy Gardner, PT 05/09/2211:35 PM   [facility] [address] [place], [place], [address] Phone: [phone]   Fax:  [phone]  Name: Natasha Fritz MRN: [id number] Date of Birth: 1960/10/17

## 2021-05-13 ENCOUNTER — Ambulatory Visit: Payer: BC Managed Care – PPO | Admitting: Physical Therapy

## 2021-05-13 ENCOUNTER — Encounter: Payer: Self-pay | Admitting: Physical Therapy

## 2021-05-13 ENCOUNTER — Other Ambulatory Visit: Payer: Self-pay

## 2021-05-13 DIAGNOSIS — R279 Unspecified lack of coordination: Secondary | ICD-10-CM | POA: Diagnosis not present

## 2021-05-13 DIAGNOSIS — M79661 Pain in right lower leg: Secondary | ICD-10-CM | POA: Diagnosis not present

## 2021-05-13 DIAGNOSIS — R2689 Other abnormalities of gait and mobility: Secondary | ICD-10-CM

## 2021-05-13 DIAGNOSIS — M6281 Muscle weakness (generalized): Secondary | ICD-10-CM

## 2021-05-13 DIAGNOSIS — M25571 Pain in right ankle and joints of right foot: Secondary | ICD-10-CM | POA: Diagnosis not present

## 2021-05-13 NOTE — Therapy (Signed)
[facility] [address], [place], [place], [address] Phone: [phone]   Fax:  [phone]  Physical Therapy Treatment  Patient Details  Name: Natasha Fritz MRN: [id number] Date of Birth: 1961/06/11 Referring Provider (PT): [place], [place] T, [place]   Encounter Date: 05/13/2021   PT End of Session - 05/13/21 1148     Visit Number 9    Date for PT Re-Evaluation 07/23/21    Authorization Type BCBS    Authorization - Visit Number 9    Authorization - Number of Visits 90    PT Start Time [id number]    PT Stop Time 1225    PT Time Calculation (min) 38 min    Activity Tolerance Patient tolerated treatment well;No increased pain    Behavior During Therapy [facility] for tasks assessed/performed             Past Medical History:  Diagnosis Date   Achilles tendinitis    Acid reflux 07/09/2020   Anxiety    Basal cell carcinoma of nose 05/23/2015   Endometrial hyperplasia without atypia, simple 09/10/2012   Endometrial hyperplasia without atypia, simple 09/10/2012   HSV-1 infection    HTN (hypertension)    Hyperlipidemia    Migraine    PONV (postoperative nausea and vomiting)     Past Surgical History:  Procedure Laterality Date   ACHILLES TENDON SURGERY Right 10/19/2020   Dr. Milinda Pointer   acl replacement     BILATERAL SALPINGECTOMY  09/10/2012   Procedure: BILATERAL SALPINGECTOMY;  Surgeon: Elveria Royals, MD;  Location: [facility];  Service: Gynecology;  Laterality: Bilateral;   CERVICAL DISC SURGERY  2012   fusion, Dr. Joya Salm   COLONOSCOPY  2011   Lionel December release Bilateral 2019   Dr. Burney Gauze    ROBOTIC ASSISTED TOTAL HYSTERECTOMY  09/10/2012   Procedure: ROBOTIC ASSISTED TOTAL HYSTERECTOMY;  Surgeon: Elveria Royals, MD;  Location: [facility];  Service: Gynecology;  Laterality: N/A;  3 hrs.   TUBAL LIGATION  1994    There were no vitals filed for this visit.   Subjective Assessment - 05/13/21 1149     Subjective Doing  well. Mowed the lawn teh other day    Currently in Pain? No/denies                               [facility] Adult PT Treatment/Exercise - 05/13/21 0001       Knee/Hip Exercises: Standing   Forward Step Up Right;1 set;10 reps;Hand Hold: 0    Forward Step Up Limitations On BOSU intermittent UE needed for balANCE    Wall Squat Limitations 10 sec yellow band hip abd 3x 10 sec hold      Ankle Exercises: Stretches   Other Educational psychologist board 2 min Bil, then static  stretch  single leg 3x 20 sec      Ankle Exercises: Standing   SLS on black pad 3x30 sec   Vc to contract gluteals more   Rebounder lateral bounding 30 sec 3x   Forward/back bounding 30 sec 3x   Other Standing Ankle Exercises toe walking in teh floor ladder: 3x up & down    Other Standing Ankle Exercises foward  and diagonal lunges 10x each LE   VC to push inot great toe more, tends to laterally roll ankle     Ankle Exercises: Aerobic   Elliptical R3 L5 6 min total alt bt  hands o n & hands off                      PT Short Term Goals - 04/01/21 1026       PT SHORT TERM GOAL #1   Title pt to be independent with HEP    Period Weeks    Status Achieved    Target Date 05/03/21               PT Long Term Goals - 03/26/21 1225       PT LONG TERM GOAL #1   Title pt to be independent with advanced HEP    Time 4    Period Months    Status On-going      PT LONG TERM GOAL #2   Title pt to demonstrate improved ankle strength to 5/5 for improved gait and stair mechanics    Time 4    Period Months    Status On-going      PT LONG TERM GOAL #3   Title pt to report increased tolerance to walking for at least 1 hour with minimal to no pain for improved distances with community mobility    Time 4    Period Months    Status On-going      PT LONG TERM GOAL #4   Title pt to demonstrate improved ROM at Rt ankle to [facility] ranges for improved ambulation and standing balance    Time 4    Period  Months    Status On-going      PT LONG TERM GOAL #5   Title pt to demonstrate improved dynamic standing balance on Rt ankle without compensatory hip strategies    Time 4    Period Months    Status On-going      Additional Long Term Goals   Additional Long Term Goals Yes      PT LONG TERM GOAL #6   Title pt will have improved FOTO score to 64 for improved functional mobility    Baseline 49    Time 4    Period Months    Status New    Target Date 07/27/21                   Plan - 05/13/21 1210     Clinical Impression Statement Pt mowed her grass this weekend for the first time. Remains with difficulty walking on toes due to weakness. Pt presented pain free today.    Personal Factors and Comorbidities Fitness    Examination-Activity Limitations Locomotion Level;Squat;Stairs;Stand;Lift;Carry    Examination-Participation Restrictions Community Activity;Driving;Shop;Yard Work    Stability/Clinical Decision Making Stable/Uncomplicated    Rehab Potential Good    PT Frequency 2x / week    PT Duration Other (comment)    PT Treatment/Interventions ADLs/Self Care Home Management;Gait training;Iontophoresis '4mg'$ /ml Dexamethasone;Stair training;Functional mobility training;Therapeutic activities;Therapeutic exercise;Balance training;Neuromuscular re-education;Manual techniques;Patient/family education;Scar mobilization;Passive range of motion;Dry needling;Energy conservation;Aquatic Therapy    PT Next Visit Plan single leg stance, balance, hip strength    PT Home Exercise Plan Access Code: [email or web address]    Consulted and Agree with Plan of Care Patient             Patient will benefit from skilled therapeutic intervention in order to improve the following deficits and impairments:  Abnormal gait, Decreased range of motion, Difficulty walking, Increased fascial restricitons, Decreased endurance, Pain, Hypomobility, Decreased activity tolerance, Decreased balance, Decreased scar mobility,  Impaired flexibility, Improper body mechanics, Postural  dysfunction, Decreased strength, Decreased mobility  Visit Diagnosis: Muscle weakness (generalized)  Lack of coordination  Other abnormalities of gait and mobility  Pain in right ankle and joints of right foot  Pain of right lower leg     Problem List Patient Active Problem List   Diagnosis Date Noted   Blood pressure check 07/24/2020   History of adenomatous polyp of colon 07/09/2020   Achilles tendinitis of both lower extremities 07/09/2020   Snoring 07/09/2020   Acid reflux 07/09/2020   History of basal cell carcinoma (BCC) 07/05/2020   Obesity (BMI 30.0-34.9) 02/09/2018   Anxiety 02/09/2018   Other abnormal glucose 05/23/2015   Medication management 05/23/2015   Vitamin D deficiency 05/23/2015   DDD (degenerative disc disease), cervical 05/23/2015   Essential hypertension 02/01/2010   Hyperlipidemia 01/31/2010    Shedrick Sarli, PTA 05/13/2021, 12:26 PM  [facility] [address], [place], [place], [address] Phone: [phone]   Fax:  [phone]  Name: Natasha Fritz MRN: [id number] Date of Birth: 16-Feb-1961

## 2021-05-15 ENCOUNTER — Ambulatory Visit: Payer: BC Managed Care – PPO | Admitting: Physical Therapy

## 2021-05-15 ENCOUNTER — Other Ambulatory Visit: Payer: Self-pay

## 2021-05-15 DIAGNOSIS — M6281 Muscle weakness (generalized): Secondary | ICD-10-CM | POA: Diagnosis not present

## 2021-05-15 DIAGNOSIS — R2689 Other abnormalities of gait and mobility: Secondary | ICD-10-CM | POA: Diagnosis not present

## 2021-05-15 DIAGNOSIS — M79661 Pain in right lower leg: Secondary | ICD-10-CM | POA: Diagnosis not present

## 2021-05-15 DIAGNOSIS — R279 Unspecified lack of coordination: Secondary | ICD-10-CM | POA: Diagnosis not present

## 2021-05-15 DIAGNOSIS — M25571 Pain in right ankle and joints of right foot: Secondary | ICD-10-CM | POA: Diagnosis not present

## 2021-05-15 NOTE — Therapy (Signed)
[facility] [address] [place], [place], [address] Phone: [phone]   Fax:  [phone]  Physical Therapy Treatment  Patient Details  Name: Natasha Fritz MRN: [id number] Date of Birth: [birth year] Referring Provider (PT): [place], [place] T, [place]   Encounter Date: 05/15/2021   PT End of Session - 05/15/21 1227     Visit Number 10    Date for PT Re-Evaluation 07/23/21    Authorization Type BCBS    Authorization - Visit Number 10    Authorization - Number of Visits 90    PT Start Time [id number]    PT Stop Time 1237    PT Time Calculation (min) 50 min    Activity Tolerance Patient tolerated treatment well;No increased pain    Behavior During Therapy [facility] for tasks assessed/performed             Past Medical History:  Diagnosis Date   Achilles tendinitis    Acid reflux 07/09/2020   Anxiety    Basal cell carcinoma of nose 05/23/2015   Endometrial hyperplasia without atypia, simple 09/10/2012   Endometrial hyperplasia without atypia, simple 09/10/2012   HSV-1 infection    HTN (hypertension)    Hyperlipidemia    Migraine    PONV (postoperative nausea and vomiting)     Past Surgical History:  Procedure Laterality Date   ACHILLES TENDON SURGERY Right 10/19/2020   Dr. Milinda Pointer   acl replacement     BILATERAL SALPINGECTOMY  09/10/2012   Procedure: BILATERAL SALPINGECTOMY;  Surgeon: Elveria Royals, MD;  Location: [facility];  Service: Gynecology;  Laterality: Bilateral;   CERVICAL DISC SURGERY  2012   fusion, Dr. Joya Salm   COLONOSCOPY  2011   Lionel December release Bilateral 2019   Dr. Burney Gauze    ROBOTIC ASSISTED TOTAL HYSTERECTOMY  09/10/2012   Procedure: ROBOTIC ASSISTED TOTAL HYSTERECTOMY;  Surgeon: Elveria Royals, MD;  Location: [facility];  Service: Gynecology;  Laterality: N/A;  3 hrs.   TUBAL LIGATION  1994    There were no vitals filed for this visit.   Subjective Assessment - 05/15/21 1150     Subjective Pt  reports soreness in ankle after mowing and long walks but reports just being tired today.    Limitations Walking;Standing    How long can you sit comfortably? no limitations    How long can you walk comfortably? ~.75-1 min with hills during walking outdoors    Currently in Pain? Yes    Pain Score 4     Pain Location Ankle    Pain Orientation Right    Pain Descriptors / Indicators Aching    Pain Onset More than a month ago    Pain Frequency Constant                               [facility] Adult PT Treatment/Exercise - 05/15/21 0001       Exercises   Exercises Knee/Hip;Ankle      Modalities   Modalities Cryotherapy      Cryotherapy   Number Minutes Cryotherapy 10 Minutes    Cryotherapy Location Ankle    Type of Cryotherapy Ice massage;Ice pack      Ankle Exercises: Stretches   Other Stretch DF stretch with strap 3x45s at Rt ankle and bil rocker board 3x45s      Ankle Exercises: Standing   SLS SLS on foam 5x10s cues to level pelvis  and maintain great toe at floor instead of laterally rolling ankle    Rebounder Rt/Lt lateral mini jumps on trampoline 3x30s; forward and backward mini jumps at trampoline 2x30s each way    Other Standing Ankle Exercises toe walking on level floor 2x40'                    PT Education - 05/15/21 1227     Education Details Pt educated on all mobility with proper techniques    Person(s) Educated Patient    Methods Explanation;Demonstration;Tactile cues;Verbal cues    Comprehension Verbalized understanding;Returned demonstration              PT Short Term Goals - 04/01/21 1026       PT SHORT TERM GOAL #1   Title pt to be independent with HEP    Period Weeks    Status Achieved    Target Date 05/03/21               PT Long Term Goals - 05/15/21 1241       PT LONG TERM GOAL #1   Title pt to be independent with advanced HEP    Time 4    Period Months    Status On-going      PT LONG TERM GOAL #2    Title pt to demonstrate improved ankle strength to 5/5 for improved gait and stair mechanics    Time 4    Period Months    Status On-going      PT LONG TERM GOAL #3   Title pt to report increased tolerance to walking for at least 1 hour with minimal to no pain for improved distances with community mobility    Time 4    Period Months    Status On-going      PT LONG TERM GOAL #4   Title pt to demonstrate improved ROM at Rt ankle to [facility] ranges for improved ambulation and standing balance    Time 4    Period Months    Status On-going      PT LONG TERM GOAL #5   Title pt to demonstrate improved dynamic standing balance on Rt ankle without compensatory hip strategies    Time 4    Period Months    Status On-going      PT LONG TERM GOAL #6   Title pt will have improved FOTO score to 64 for improved functional mobility    Baseline 49    Time 4    Period Months    Status On-going                   Plan - 05/15/21 1228     Clinical Impression Statement Pt presents to clinic reporting noted decreased swelling in Rt ankle but reports being tired today after increased activity this week. Pt session focused on ankle strengthening with dynamic activity and increased difficulty with toe walking noted with lateral instability. Pt reporting feeling weak with these activities but happy she is able to do them. Pt would benefit from continued PT for improved flexibility, strengthening, increased endurance.    Personal Factors and Comorbidities Fitness    Examination-Activity Limitations Locomotion Level;Squat;Stairs;Stand;Lift;Carry    Examination-Participation Restrictions Community Activity;Driving;Shop;Yard Work    Stability/Clinical Decision Making Stable/Uncomplicated    Clinical Decision Making Low    Rehab Potential Good    PT Frequency 2x / week    PT Duration Other (comment)    PT  Treatment/Interventions ADLs/Self Care Home Management;Gait training;Iontophoresis '4mg'$ /ml  Dexamethasone;Stair training;Functional mobility training;Therapeutic activities;Therapeutic exercise;Balance training;Neuromuscular re-education;Manual techniques;Patient/family education;Scar mobilization;Passive range of motion;Dry needling;Energy conservation;Aquatic Therapy    PT Next Visit Plan single leg stance, balance, hip strength    PT Home Exercise Plan Access Code: [email or web address]    Consulted and Agree with Plan of Care Patient             Patient will benefit from skilled therapeutic intervention in order to improve the following deficits and impairments:  Abnormal gait, Decreased range of motion, Difficulty walking, Increased fascial restricitons, Decreased endurance, Pain, Hypomobility, Decreased activity tolerance, Decreased balance, Decreased scar mobility, Impaired flexibility, Improper body mechanics, Postural dysfunction, Decreased strength, Decreased mobility  Visit Diagnosis: Muscle weakness (generalized)  Other abnormalities of gait and mobility     Problem List Patient Active Problem List   Diagnosis Date Noted   Blood pressure check 07/24/2020   History of adenomatous polyp of colon 07/09/2020   Achilles tendinitis of both lower extremities 07/09/2020   Snoring 07/09/2020   Acid reflux 07/09/2020   History of basal cell carcinoma (BCC) 07/05/2020   Obesity (BMI 30.0-34.9) 02/09/2018   Anxiety 02/09/2018   Other abnormal glucose 05/23/2015   Medication management 05/23/2015   Vitamin D deficiency 05/23/2015   DDD (degenerative disc disease), cervical 05/23/2015   Essential hypertension 02/01/2010   Hyperlipidemia 01/31/2010    Stacy Gardner, PT 05/16/2211:44 PM   [facility] [address], [place], [place], [address] Phone: [phone]   Fax:  [phone]  Name: Natasha Fritz MRN: [id number] Date of Birth: 1961-06-19

## 2021-05-20 ENCOUNTER — Other Ambulatory Visit: Payer: Self-pay

## 2021-05-20 ENCOUNTER — Ambulatory Visit: Payer: BC Managed Care – PPO | Admitting: Physical Therapy

## 2021-05-20 ENCOUNTER — Encounter: Payer: Self-pay | Admitting: Physical Therapy

## 2021-05-20 DIAGNOSIS — R279 Unspecified lack of coordination: Secondary | ICD-10-CM | POA: Diagnosis not present

## 2021-05-20 DIAGNOSIS — M6281 Muscle weakness (generalized): Secondary | ICD-10-CM

## 2021-05-20 DIAGNOSIS — M79661 Pain in right lower leg: Secondary | ICD-10-CM | POA: Diagnosis not present

## 2021-05-20 DIAGNOSIS — R2689 Other abnormalities of gait and mobility: Secondary | ICD-10-CM

## 2021-05-20 DIAGNOSIS — M25571 Pain in right ankle and joints of right foot: Secondary | ICD-10-CM | POA: Diagnosis not present

## 2021-05-20 NOTE — Therapy (Signed)
[facility] [address], [place], [place], [address] Phone: [phone]   Fax:  [phone]  Physical Therapy Treatment  Patient Details  Name: Natasha Fritz MRN: [id number] Date of Birth: 10/18/1960 Referring Provider (PT): [place], [place] T, [place]   Encounter Date: 05/20/2021   PT End of Session - 05/20/21 1150     Visit Number 11    Date for PT Re-Evaluation 07/23/21    Authorization Type BCBS    Authorization - Visit Number 20    Authorization - Number of Visits 90    PT Start Time [id number]    PT Stop Time 1230    PT Time Calculation (min) 42 min    Activity Tolerance Patient tolerated treatment well;No increased pain    Behavior During Therapy [facility] for tasks assessed/performed             Past Medical History:  Diagnosis Date   Achilles tendinitis    Acid reflux 07/09/2020   Anxiety    Basal cell carcinoma of nose 05/23/2015   Endometrial hyperplasia without atypia, simple 09/10/2012   Endometrial hyperplasia without atypia, simple 09/10/2012   HSV-1 infection    HTN (hypertension)    Hyperlipidemia    Migraine    PONV (postoperative nausea and vomiting)     Past Surgical History:  Procedure Laterality Date   ACHILLES TENDON SURGERY Right 10/19/2020   Dr. Milinda Pointer   acl replacement     BILATERAL SALPINGECTOMY  09/10/2012   Procedure: BILATERAL SALPINGECTOMY;  Surgeon: Elveria Royals, MD;  Location: [facility];  Service: Gynecology;  Laterality: Bilateral;   CERVICAL DISC SURGERY  2012   fusion, Dr. Joya Salm   COLONOSCOPY  2011   Lionel December release Bilateral 2019   Dr. Burney Gauze    ROBOTIC ASSISTED TOTAL HYSTERECTOMY  09/10/2012   Procedure: ROBOTIC ASSISTED TOTAL HYSTERECTOMY;  Surgeon: Elveria Royals, MD;  Location: [facility];  Service: Gynecology;  Laterality: N/A;  3 hrs.   TUBAL LIGATION  1994    There were no vitals filed for this visit.   Subjective Assessment - 05/20/21 1151     Subjective Still  a little sore today, did some stretching this AM. Did teh long walk over the weekend and it did not make anything worse.    Currently in Pain? Yes    Pain Score 4     Pain Location Ankle    Pain Orientation Right    Pain Descriptors / Indicators Sore    Aggravating Factors  over doing    Pain Relieving Factors rest, ice, stretching                               [facility] Adult PT Treatment/Exercise - 05/20/21 0001       Cryotherapy   Number Minutes Cryotherapy 10 Minutes    Cryotherapy Location Ankle    Type of Cryotherapy Ice pack      Ankle Exercises: Aerobic   Elliptical R3 L 3 ( reduced for ankle soreness) x 6 min      Ankle Exercises: Stretches   Other Educational psychologist board 2 min Bil, then static  stretch  single leg 3x 20 sec    Other Stretch DF stretch o nstairs 2 min      Ankle Exercises: Standing   SLS SLS with Plyo toss 10x in tandem, LT toe touch for SLS, DLS on black pad [id number]  Rebounder Rt/Lt lateral mini jumps on trampoline 3x30s; forward and backward mini jumps at trampoline 2x30s each way    Other Standing Ankle Exercises SLS on black pad 4x 10 sec, VC to contract quad and glute more    Other Standing Ankle Exercises Toe walking forward and sideways on floor ladder 2x through each                      PT Short Term Goals - 04/01/21 1026       PT SHORT TERM GOAL #1   Title pt to be independent with HEP    Period Weeks    Status Achieved    Target Date 05/03/21               PT Long Term Goals - 05/15/21 1241       PT LONG TERM GOAL #1   Title pt to be independent with advanced HEP    Time 4    Period Months    Status On-going      PT LONG TERM GOAL #2   Title pt to demonstrate improved ankle strength to 5/5 for improved gait and stair mechanics    Time 4    Period Months    Status On-going      PT LONG TERM GOAL #3   Title pt to report increased tolerance to walking for at least 1 hour with minimal to no pain  for improved distances with community mobility    Time 4    Period Months    Status On-going      PT LONG TERM GOAL #4   Title pt to demonstrate improved ROM at Rt ankle to [facility] ranges for improved ambulation and standing balance    Time 4    Period Months    Status On-going      PT LONG TERM GOAL #5   Title pt to demonstrate improved dynamic standing balance on Rt ankle without compensatory hip strategies    Time 4    Period Months    Status On-going      PT LONG TERM GOAL #6   Title pt will have improved FOTO score to 64 for improved functional mobility    Baseline 49    Time 4    Period Months    Status On-going                   Plan - 05/20/21 1150     Clinical Impression Statement Pt presents with no swelling but mild soreness. She was able to complete her long walk over the weekend without any adverse effects. Pt demonstrated slight improvement in her ability to toe walk today. posterior ankle soreness increased as session progressed, stooped about 30 min to ice.    Personal Factors and Comorbidities Fitness    Examination-Activity Limitations Locomotion Level;Squat;Stairs;Stand;Lift;Carry    Examination-Participation Restrictions Community Activity;Driving;Shop;Yard Work    Stability/Clinical Decision Making Stable/Uncomplicated    Rehab Potential Good    PT Frequency 2x / week    PT Duration Other (comment)    PT Treatment/Interventions ADLs/Self Care Home Management;Gait training;Iontophoresis '4mg'$ /ml Dexamethasone;Stair training;Functional mobility training;Therapeutic activities;Therapeutic exercise;Balance training;Neuromuscular re-education;Manual techniques;Patient/family education;Scar mobilization;Passive range of motion;Dry needling;Energy conservation;Aquatic Therapy    PT Next Visit Plan single leg stance, balance, hip strength    PT Home Exercise Plan Access Code: [email or web address]    Consulted and Agree with Plan of Care Patient  Patient  will benefit from skilled therapeutic intervention in order to improve the following deficits and impairments:  Abnormal gait, Decreased range of motion, Difficulty walking, Increased fascial restricitons, Decreased endurance, Pain, Hypomobility, Decreased activity tolerance, Decreased balance, Decreased scar mobility, Impaired flexibility, Improper body mechanics, Postural dysfunction, Decreased strength, Decreased mobility  Visit Diagnosis: Muscle weakness (generalized)  Other abnormalities of gait and mobility  Lack of coordination  Pain in right ankle and joints of right foot  Pain of right lower leg     Problem List Patient Active Problem List   Diagnosis Date Noted   Blood pressure check 07/24/2020   History of adenomatous polyp of colon 07/09/2020   Achilles tendinitis of both lower extremities 07/09/2020   Snoring 07/09/2020   Acid reflux 07/09/2020   History of basal cell carcinoma (BCC) 07/05/2020   Obesity (BMI 30.0-34.9) 02/09/2018   Anxiety 02/09/2018   Other abnormal glucose 05/23/2015   Medication management 05/23/2015   Vitamin D deficiency 05/23/2015   DDD (degenerative disc disease), cervical 05/23/2015   Essential hypertension 02/01/2010   Hyperlipidemia 01/31/2010    Natasha Fritz, PTA 05/20/2021, 12:23 PM  [facility] [address], [place], [place], [address] Phone: [phone]   Fax:  [phone]  Name: GLENDALE METZLER MRN: [id number] Date of Birth: 04-17-1961

## 2021-05-22 ENCOUNTER — Ambulatory Visit: Payer: BC Managed Care – PPO | Admitting: Physical Therapy

## 2021-05-22 ENCOUNTER — Other Ambulatory Visit: Payer: Self-pay

## 2021-05-22 ENCOUNTER — Encounter: Payer: Self-pay | Admitting: Physical Therapy

## 2021-05-22 DIAGNOSIS — R2689 Other abnormalities of gait and mobility: Secondary | ICD-10-CM | POA: Diagnosis not present

## 2021-05-22 DIAGNOSIS — M6281 Muscle weakness (generalized): Secondary | ICD-10-CM | POA: Diagnosis not present

## 2021-05-22 DIAGNOSIS — R279 Unspecified lack of coordination: Secondary | ICD-10-CM | POA: Diagnosis not present

## 2021-05-22 DIAGNOSIS — M25571 Pain in right ankle and joints of right foot: Secondary | ICD-10-CM

## 2021-05-22 DIAGNOSIS — M79661 Pain in right lower leg: Secondary | ICD-10-CM | POA: Diagnosis not present

## 2021-05-22 NOTE — Therapy (Signed)
[facility] [address] [place], [place], [address] Phone: [phone]   Fax:  [phone]  Physical Therapy Treatment  Patient Details  Name: Natasha Fritz MRN: [id number] Date of Birth: 1960-11-19 Referring Provider (PT): [place], [place] T, [place]   Encounter Date: 05/22/2021   PT End of Session - 05/22/21 1152     Visit Number 12    Date for PT Re-Evaluation 07/23/21    Authorization Type BCBS    Authorization - Visit Number 20    Authorization - Number of Visits 90    PT Start Time [id number]    PT Stop Time 1227    PT Time Calculation (min) 41 min    Activity Tolerance Patient tolerated treatment well;No increased pain    Behavior During Therapy [facility] for tasks assessed/performed             Past Medical History:  Diagnosis Date   Achilles tendinitis    Acid reflux 07/09/2020   Anxiety    Basal cell carcinoma of nose 05/23/2015   Endometrial hyperplasia without atypia, simple 09/10/2012   Endometrial hyperplasia without atypia, simple 09/10/2012   HSV-1 infection    HTN (hypertension)    Hyperlipidemia    Migraine    PONV (postoperative nausea and vomiting)     Past Surgical History:  Procedure Laterality Date   ACHILLES TENDON SURGERY Right 10/19/2020   Dr. Milinda Pointer   acl replacement     BILATERAL SALPINGECTOMY  09/10/2012   Procedure: BILATERAL SALPINGECTOMY;  Surgeon: Elveria Royals, MD;  Location: [facility];  Service: Gynecology;  Laterality: Bilateral;   CERVICAL DISC SURGERY  2012   fusion, Dr. Joya Salm   COLONOSCOPY  2011   Lionel December release Bilateral 2019   Dr. Burney Gauze    ROBOTIC ASSISTED TOTAL HYSTERECTOMY  09/10/2012   Procedure: ROBOTIC ASSISTED TOTAL HYSTERECTOMY;  Surgeon: Elveria Royals, MD;  Location: [facility];  Service: Gynecology;  Laterality: N/A;  3 hrs.   TUBAL LIGATION  1994    There were no vitals filed for this visit.   Subjective Assessment - 05/22/21 1147     Subjective Pt  reports she was sore after last session but relief with resting.    Limitations Walking;Standing    How long can you sit comfortably? no limitations    How long can you walk comfortably? ~.75-1 min with hills during walking outdoors    Currently in Pain? Yes    Pain Score 3     Pain Location Ankle    Pain Orientation Right    Pain Descriptors / Indicators Sore    Pain Type Chronic pain    Pain Onset More than a month ago    Pain Frequency Constant                               [facility] Adult PT Treatment/Exercise - 05/22/21 0001       Exercises   Exercises Ankle;Knee/Hip      Lumbar Exercises: Machines for Strengthening   Leg Press x10 heel raises 30#; Rt leg only heel raise 30# x10      Ankle Exercises: Aerobic   Recumbent Bike L3 6 mins with cues for mobility at ankle for improved range      Ankle Exercises: Stretches   Other Stretch Rocker board 2 min Bil, then static  stretch  single leg 3x 20 sec  Other Stretch DF stretch 2x1 min      Ankle Exercises: Standing   Rebounder Rt/Lt lateral mini jumps on trampoline 3x30s; forward and backward mini jumps at trampoline 2x30s each way    Other Standing Ankle Exercises SLS on black pad 5x 10s    Other Standing Ankle Exercises Toe walking forward and sideways on floor 2x through each 30'                    PT Education - 05/22/21 1151     Education Details Pt educated on modifications as needed during session for improved tolerance during treatment and improved comfort    Person(s) Educated Patient    Methods Explanation;Demonstration;Tactile cues;Verbal cues    Comprehension Verbalized understanding;Returned demonstration              PT Short Term Goals - 04/01/21 1026       PT SHORT TERM GOAL #1   Title pt to be independent with HEP    Period Weeks    Status Achieved    Target Date 05/03/21               PT Long Term Goals - 05/15/21 1241       PT LONG TERM GOAL #1    Title pt to be independent with advanced HEP    Time 4    Period Months    Status On-going      PT LONG TERM GOAL #2   Title pt to demonstrate improved ankle strength to 5/5 for improved gait and stair mechanics    Time 4    Period Months    Status On-going      PT LONG TERM GOAL #3   Title pt to report increased tolerance to walking for at least 1 hour with minimal to no pain for improved distances with community mobility    Time 4    Period Months    Status On-going      PT LONG TERM GOAL #4   Title pt to demonstrate improved ROM at Rt ankle to [facility] ranges for improved ambulation and standing balance    Time 4    Period Months    Status On-going      PT LONG TERM GOAL #5   Title pt to demonstrate improved dynamic standing balance on Rt ankle without compensatory hip strategies    Time 4    Period Months    Status On-going      PT LONG TERM GOAL #6   Title pt will have improved FOTO score to 64 for improved functional mobility    Baseline 49    Time 4    Period Months    Status On-going                   Plan - 05/22/21 1153     Clinical Impression Statement Pt reports she was sore after last session but improved with rest and has been continuing to do HEP and attempt to progress at home as well. Pt session focused on ankle strengthening and stability, SLS for improved strength and stability as well. Pt reported she felt better about session today and her ability to do all tasks. Pt would benefit from continued PT for improved strength, mobility, increased tolerance to activity ad decreased pain.    Personal Factors and Comorbidities Fitness    Examination-Activity Limitations Locomotion Level;Squat;Stairs;Stand;Lift;Carry    Examination-Participation Restrictions Community Activity;Driving;Shop;Yard Work  Stability/Clinical Decision Making Stable/Uncomplicated    Clinical Decision Making Low    Rehab Potential Good    PT Frequency 2x / week    PT Duration  Other (comment)    PT Treatment/Interventions ADLs/Self Care Home Management;Gait training;Iontophoresis '4mg'$ /ml Dexamethasone;Stair training;Functional mobility training;Therapeutic activities;Therapeutic exercise;Balance training;Neuromuscular re-education;Manual techniques;Patient/family education;Scar mobilization;Passive range of motion;Dry needling;Energy conservation;Aquatic Therapy    PT Next Visit Plan single leg stance, balance, hip strength    PT Home Exercise Plan Access Code: [email or web address]    Consulted and Agree with Plan of Care Patient             Patient will benefit from skilled therapeutic intervention in order to improve the following deficits and impairments:  Abnormal gait, Decreased range of motion, Difficulty walking, Increased fascial restricitons, Decreased endurance, Pain, Hypomobility, Decreased activity tolerance, Decreased balance, Decreased scar mobility, Impaired flexibility, Improper body mechanics, Postural dysfunction, Decreased strength, Decreased mobility  Visit Diagnosis: Muscle weakness (generalized)  Pain in right ankle and joints of right foot  Other abnormalities of gait and mobility     Problem List Patient Active Problem List   Diagnosis Date Noted   Blood pressure check 07/24/2020   History of adenomatous polyp of colon 07/09/2020   Achilles tendinitis of both lower extremities 07/09/2020   Snoring 07/09/2020   Acid reflux 07/09/2020   History of basal cell carcinoma (BCC) 07/05/2020   Obesity (BMI 30.0-34.9) 02/09/2018   Anxiety 02/09/2018   Other abnormal glucose 05/23/2015   Medication management 05/23/2015   Vitamin D deficiency 05/23/2015   DDD (degenerative disc disease), cervical 05/23/2015   Essential hypertension 02/01/2010   Hyperlipidemia 01/31/2010   Stacy Gardner, PT, DPT 05/23/2211:30 PM   [facility] [address] [place], [place], [address] Phone:  [phone]   Fax:  [phone]  Name: Natasha Fritz MRN: [id number] Date of Birth: Feb 22, 1961

## 2021-05-24 ENCOUNTER — Other Ambulatory Visit: Payer: Self-pay | Admitting: Adult Health

## 2021-05-24 DIAGNOSIS — I10 Essential (primary) hypertension: Secondary | ICD-10-CM

## 2021-05-24 DIAGNOSIS — G43809 Other migraine, not intractable, without status migrainosus: Secondary | ICD-10-CM

## 2021-05-29 ENCOUNTER — Encounter: Payer: BC Managed Care – PPO | Admitting: Physical Therapy

## 2021-05-31 ENCOUNTER — Encounter: Payer: BC Managed Care – PPO | Admitting: Physical Therapy

## 2021-06-03 ENCOUNTER — Other Ambulatory Visit: Payer: Self-pay

## 2021-06-03 ENCOUNTER — Ambulatory Visit: Payer: BC Managed Care – PPO | Attending: Podiatry | Admitting: Physical Therapy

## 2021-06-03 ENCOUNTER — Encounter: Payer: Self-pay | Admitting: Physical Therapy

## 2021-06-03 DIAGNOSIS — R2689 Other abnormalities of gait and mobility: Secondary | ICD-10-CM | POA: Diagnosis not present

## 2021-06-03 DIAGNOSIS — R279 Unspecified lack of coordination: Secondary | ICD-10-CM | POA: Diagnosis not present

## 2021-06-03 DIAGNOSIS — M25571 Pain in right ankle and joints of right foot: Secondary | ICD-10-CM | POA: Diagnosis not present

## 2021-06-03 DIAGNOSIS — M6281 Muscle weakness (generalized): Secondary | ICD-10-CM | POA: Diagnosis not present

## 2021-06-03 DIAGNOSIS — M79661 Pain in right lower leg: Secondary | ICD-10-CM | POA: Insufficient documentation

## 2021-06-03 NOTE — Therapy (Signed)
[facility] [address], [place], [place], [address] Phone: [phone]   Fax:  [phone]  Physical Therapy Treatment  Patient Details  Name: Natasha Fritz MRN: [id number] Date of Birth: 03-Oct-1960 Referring Provider (PT): [place], [place] T, [place]   Encounter Date: 06/03/2021   PT End of Session - 06/03/21 1156     Visit Number 13    Date for PT Re-Evaluation 07/23/21    Authorization Type BCBS    Authorization - Visit Number 20    Authorization - Number of Visits 90    PT Start Time [id number]   pt late   PT Stop Time 1231    PT Time Calculation (min) 36 min    Activity Tolerance Patient tolerated treatment well;No increased pain    Behavior During Therapy [facility] for tasks assessed/performed             Past Medical History:  Diagnosis Date   Achilles tendinitis    Acid reflux 07/09/2020   Anxiety    Basal cell carcinoma of nose 05/23/2015   Endometrial hyperplasia without atypia, simple 09/10/2012   Endometrial hyperplasia without atypia, simple 09/10/2012   HSV-1 infection    HTN (hypertension)    Hyperlipidemia    Migraine    PONV (postoperative nausea and vomiting)     Past Surgical History:  Procedure Laterality Date   ACHILLES TENDON SURGERY Right 10/19/2020   Dr. Milinda Pointer   acl replacement     BILATERAL SALPINGECTOMY  09/10/2012   Procedure: BILATERAL SALPINGECTOMY;  Surgeon: Elveria Royals, MD;  Location: [facility];  Service: Gynecology;  Laterality: Bilateral;   CERVICAL DISC SURGERY  2012   fusion, Dr. Joya Salm   COLONOSCOPY  2011   Lionel December release Bilateral 2019   Dr. Burney Gauze    ROBOTIC ASSISTED TOTAL HYSTERECTOMY  09/10/2012   Procedure: ROBOTIC ASSISTED TOTAL HYSTERECTOMY;  Surgeon: Elveria Royals, MD;  Location: [facility];  Service: Gynecology;  Laterality: N/A;  3 hrs.   TUBAL LIGATION  1994    There were no vitals filed for this visit.       [facility] PT Assessment - 06/03/21 0001        Strength   Right Ankle Plantar Flexion 3/5   in standing performing single leg heel raise                          [facility] Adult PT Treatment/Exercise - 06/03/21 0001       Lumbar Exercises: Machines for Strengthening   Leg Press 30# bil 10x, then RT only 2x10      Ankle Exercises: Standing   Rebounder Rt/Lt lateral mini jumps on trampoline 3x30s; forward and backward mini jumps at trampoline 2x30s each way    Heel Raises Both;20 reps   On mini tramp   Other Standing Ankle Exercises SLS on black pad 5x 10s   red plyo ball toss 3x 20     Ankle Exercises: Stretches   Other Stretch Rocker board 2 min Bil, then static  stretch  single leg 3x 30 sec                       PT Short Term Goals - 04/01/21 1026       PT SHORT TERM GOAL #1   Title pt to be independent with HEP    Period Weeks    Status Achieved  Target Date 05/03/21               PT Long Term Goals - 06/03/21 1222       PT LONG TERM GOAL #3   Title pt to report increased tolerance to walking for at least 1 hour with minimal to no pain for improved distances with community mobility    Time 4    Period Months    Status On-going   30-45 min and begins very tight, loosens with walking                  Plan - 06/03/21 1158     Clinical Impression Statement Pt just [place] form the beach. A littel sore from her recreational walking but " i'm getting stronger." Treatment continues to focus on Rt plantarflexion strength unilaterally. pt can stay on her toes better than previously but remains with difficulty staying there for long periods.    Personal Factors and Comorbidities Fitness    Examination-Activity Limitations Locomotion Level;Squat;Stairs;Stand;Lift;Carry    Examination-Participation Restrictions Community Activity;Driving;Shop;Yard Work    Stability/Clinical Decision Making Stable/Uncomplicated    Rehab Potential Good    PT Frequency 2x / week    PT Duration  Other (comment)    PT Treatment/Interventions ADLs/Self Care Home Management;Gait training;Iontophoresis '4mg'$ /ml Dexamethasone;Stair training;Functional mobility training;Therapeutic activities;Therapeutic exercise;Balance training;Neuromuscular re-education;Manual techniques;Patient/family education;Scar mobilization;Passive range of motion;Dry needling;Energy conservation;Aquatic Therapy    PT Next Visit Plan Pt's last visit before she goes away for a bit of time: MMT ankle and hip for goal check, discuss plan with pt, pt may want to take a month off of PT and see how she does on her own.    PT Home Exercise Plan Access Code: [email or web address]    Consulted and Agree with Plan of Care Patient             Patient will benefit from skilled therapeutic intervention in order to improve the following deficits and impairments:  Abnormal gait, Decreased range of motion, Difficulty walking, Increased fascial restricitons, Decreased endurance, Pain, Hypomobility, Decreased activity tolerance, Decreased balance, Decreased scar mobility, Impaired flexibility, Improper body mechanics, Postural dysfunction, Decreased strength, Decreased mobility  Visit Diagnosis: Muscle weakness (generalized)  Pain in right ankle and joints of right foot  Other abnormalities of gait and mobility  Lack of coordination  Pain of right lower leg     Problem List Patient Active Problem List   Diagnosis Date Noted   Blood pressure check 07/24/2020   History of adenomatous polyp of colon 07/09/2020   Achilles tendinitis of both lower extremities 07/09/2020   Snoring 07/09/2020   Acid reflux 07/09/2020   History of basal cell carcinoma (BCC) 07/05/2020   Obesity (BMI 30.0-34.9) 02/09/2018   Anxiety 02/09/2018   Other abnormal glucose 05/23/2015   Medication management 05/23/2015   Vitamin D deficiency 05/23/2015   DDD (degenerative disc disease), cervical 05/23/2015   Essential hypertension 02/01/2010   Hyperlipidemia  01/31/2010    Latreece Mochizuki, PTA 06/03/2021, 12:31 PM  [facility] [address], [place], [place], [address] Phone: [phone]   Fax:  [phone]  Name: CAROLENA LOWNES MRN: [id number] Date of Birth: [birth year]

## 2021-06-05 ENCOUNTER — Ambulatory Visit: Payer: BC Managed Care – PPO | Admitting: Physical Therapy

## 2021-06-05 ENCOUNTER — Encounter: Payer: Self-pay | Admitting: Physical Therapy

## 2021-06-05 ENCOUNTER — Other Ambulatory Visit: Payer: Self-pay

## 2021-06-05 DIAGNOSIS — M25571 Pain in right ankle and joints of right foot: Secondary | ICD-10-CM | POA: Diagnosis not present

## 2021-06-05 DIAGNOSIS — M79661 Pain in right lower leg: Secondary | ICD-10-CM | POA: Diagnosis not present

## 2021-06-05 DIAGNOSIS — M6281 Muscle weakness (generalized): Secondary | ICD-10-CM | POA: Diagnosis not present

## 2021-06-05 DIAGNOSIS — R2689 Other abnormalities of gait and mobility: Secondary | ICD-10-CM | POA: Diagnosis not present

## 2021-06-05 DIAGNOSIS — R279 Unspecified lack of coordination: Secondary | ICD-10-CM | POA: Diagnosis not present

## 2021-06-05 NOTE — Therapy (Signed)
[facility] [address] [place], [place], [address] Phone: [phone]   Fax:  [phone]  Physical Therapy Treatment  Patient Details  Name: Natasha Fritz MRN: [id number] Date of Birth: 1961/05/16 Referring Provider (PT): [place], [place] T, [place]   Encounter Date: 06/05/2021   PT End of Session - 06/05/21 1155     Visit Number 14    Date for PT Re-Evaluation 07/23/21    Authorization Type BCBS    Authorization - Visit Number 20    Authorization - Number of Visits 90    PT Start Time [id number]    PT Stop Time 1230    PT Time Calculation (min) 44 min    Activity Tolerance Patient tolerated treatment well;No increased pain    Behavior During Therapy [facility] for tasks assessed/performed             Past Medical History:  Diagnosis Date   Achilles tendinitis    Acid reflux 07/09/2020   Anxiety    Basal cell carcinoma of nose 05/23/2015   Endometrial hyperplasia without atypia, simple 09/10/2012   Endometrial hyperplasia without atypia, simple 09/10/2012   HSV-1 infection    HTN (hypertension)    Hyperlipidemia    Migraine    PONV (postoperative nausea and vomiting)     Past Surgical History:  Procedure Laterality Date   ACHILLES TENDON SURGERY Right 10/19/2020   Dr. Milinda Pointer   acl replacement     BILATERAL SALPINGECTOMY  09/10/2012   Procedure: BILATERAL SALPINGECTOMY;  Surgeon: Elveria Royals, MD;  Location: [facility];  Service: Gynecology;  Laterality: Bilateral;   CERVICAL DISC SURGERY  2012   fusion, Dr. Joya Salm   COLONOSCOPY  2011   Lionel December release Bilateral 2019   Dr. Burney Gauze    ROBOTIC ASSISTED TOTAL HYSTERECTOMY  09/10/2012   Procedure: ROBOTIC ASSISTED TOTAL HYSTERECTOMY;  Surgeon: Elveria Royals, MD;  Location: [facility];  Service: Gynecology;  Laterality: N/A;  3 hrs.   TUBAL LIGATION  1994    There were no vitals filed for this visit.                      [facility] PT  Treatment/Exercise - 06/05/21 0001       Exercises   Exercises Ankle;Knee/Hip      Knee/Hip Exercises: Aerobic   Recumbent Bike L3 6 mins      Knee/Hip Exercises: Machines for Strengthening   Cybex Leg Press Seat 6 Bil 70# 15x focus on keeping heels on platform: heel raises Bil 50# 15x      Modalities   Modalities Vasopneumatic      Cryotherapy   Number Minutes Cryotherapy 8 Minutes    Cryotherapy Location Ankle    Type of Cryotherapy Ice massage;Ice pack      Ankle Exercises: Clinical research associate Limitations 5x45s at stairs      Ankle Exercises: Standing   Rebounder Rt/Lt lateral mini jumps on trampoline 3x30s; forward and backward mini jumps at trampoline 2x30s each way    Heel Raises Both;20 reps   and x10 single leg stance   Other Standing Ankle Exercises SLS on black pad 5x 10s   red plyo ball toss 3x 20   Other Standing Ankle Exercises Toe walking forward and sideways on floor through each 50'      Ankle Exercises: Seated   Other Seated Ankle Exercises towel crunches 2x10  PT Education - 06/05/21 1154     Education Details Pt educated on technique with all exercises for proper mechanics    Person(s) Educated Patient    Methods Demonstration;Tactile cues;Verbal cues    Comprehension Verbalized understanding;Returned demonstration              PT Short Term Goals - 04/01/21 1026       PT SHORT TERM GOAL #1   Title pt to be independent with HEP    Period Weeks    Status Achieved    Target Date 05/03/21               PT Long Term Goals - 06/03/21 1222       PT LONG TERM GOAL #3   Title pt to report increased tolerance to walking for at least 1 hour with minimal to no pain for improved distances with community mobility    Time 4    Period Months    Status On-going   30-45 min and begins very tight, loosens with walking                  Plan - 06/05/21 1156     Clinical Impression Statement Pt  requests to have next appointment in one month due to traveling and wanting to monitor self progressions at home. Pt range for dorsiflexion on Rt ankle 12 and 16 on Lt and strength improved MMT to 4+/5 throughout with 4/5 plantar flexion compared to 5/5 Lt LE. Pt session focused on strenthening and stability of Rt ankle for functional activity. Pt continued to demonstrate difficulty with single leg activity and reports "stiffiness and soreness". Pt will be reassessed on next visit in 4 weeks as pt would benefit from PT for improved mobility and strengthening.    Personal Factors and Comorbidities Fitness    Examination-Activity Limitations Locomotion Level;Squat;Stairs;Stand;Lift;Carry    Examination-Participation Restrictions Community Activity;Driving;Shop;Yard Work    Stability/Clinical Decision Making Stable/Uncomplicated    Clinical Decision Making Low    Rehab Potential Good    PT Frequency 2x / week    PT Duration Other (comment)    PT Treatment/Interventions ADLs/Self Care Home Management;Gait training;Iontophoresis '4mg'$ /ml Dexamethasone;Stair training;Functional mobility training;Therapeutic activities;Therapeutic exercise;Balance training;Neuromuscular re-education;Manual techniques;Patient/family education;Scar mobilization;Passive range of motion;Dry needling;Energy conservation;Aquatic Therapy    PT Next Visit Plan reassess at one month    PT Home Exercise Plan Access Code: [email or web address]    Consulted and Agree with Plan of Care Patient             Patient will benefit from skilled therapeutic intervention in order to improve the following deficits and impairments:  Abnormal gait, Decreased range of motion, Difficulty walking, Increased fascial restricitons, Decreased endurance, Pain, Hypomobility, Decreased activity tolerance, Decreased balance, Decreased scar mobility, Impaired flexibility, Improper body mechanics, Postural dysfunction, Decreased strength, Decreased mobility  Visit  Diagnosis: Muscle weakness (generalized)  Lack of coordination  Other abnormalities of gait and mobility     Problem List Patient Active Problem List   Diagnosis Date Noted   Blood pressure check 07/24/2020   History of adenomatous polyp of colon 07/09/2020   Achilles tendinitis of both lower extremities 07/09/2020   Snoring 07/09/2020   Acid reflux 07/09/2020   History of basal cell carcinoma (BCC) 07/05/2020   Obesity (BMI 30.0-34.9) 02/09/2018   Anxiety 02/09/2018   Other abnormal glucose 05/23/2015   Medication management 05/23/2015   Vitamin D deficiency 05/23/2015   DDD (degenerative disc disease), cervical 05/23/2015   Essential  hypertension 02/01/2010   Hyperlipidemia 01/31/2010    Stacy Gardner, PT, DPT 06/06/2211:34 PM   [facility] [address], [place], [place], [address] Phone: [phone]   Fax:  [phone]  Name: Natasha Fritz MRN: [id number] Date of Birth: Dec 29, 1960

## 2021-07-09 ENCOUNTER — Ambulatory Visit: Payer: BC Managed Care – PPO | Attending: Podiatry | Admitting: Physical Therapy

## 2021-07-09 ENCOUNTER — Other Ambulatory Visit: Payer: Self-pay

## 2021-07-09 ENCOUNTER — Encounter: Payer: Self-pay | Admitting: Physical Therapy

## 2021-07-09 DIAGNOSIS — M6281 Muscle weakness (generalized): Secondary | ICD-10-CM | POA: Diagnosis not present

## 2021-07-09 DIAGNOSIS — R269 Unspecified abnormalities of gait and mobility: Secondary | ICD-10-CM | POA: Diagnosis not present

## 2021-07-09 DIAGNOSIS — M25571 Pain in right ankle and joints of right foot: Secondary | ICD-10-CM | POA: Insufficient documentation

## 2021-07-09 NOTE — Therapy (Signed)
[facility] @ [place], [place], [address] Phone: [phone]   Fax:  [phone]  Physical Therapy Treatment  Patient Details  Name: Natasha Fritz MRN: [id number] Date of Birth: Mar 04, 1961 Referring Provider (PT): [place], [place] T, [place]   Encounter Date: 07/09/2021   PT End of Session - 07/09/21 1000     Visit Number 15    Date for PT Re-Evaluation 07/23/21    Authorization Type BCBS    Authorization - Visit Number 20    Authorization - Number of Visits 90    PT Start Time 0845    PT Stop Time 0932    PT Time Calculation (min) 47 min    Activity Tolerance Patient tolerated treatment well;No increased pain    Behavior During Therapy [facility] for tasks assessed/performed             Past Medical History:  Diagnosis Date   Achilles tendinitis    Acid reflux 07/09/2020   Anxiety    Basal cell carcinoma of nose 05/23/2015   Endometrial hyperplasia without atypia, simple 09/10/2012   Endometrial hyperplasia without atypia, simple 09/10/2012   HSV-1 infection    HTN (hypertension)    Hyperlipidemia    Migraine    PONV (postoperative nausea and vomiting)     Past Surgical History:  Procedure Laterality Date   ACHILLES TENDON SURGERY Right 10/19/2020   Dr. Milinda Pointer   acl replacement     BILATERAL SALPINGECTOMY  09/10/2012   Procedure: BILATERAL SALPINGECTOMY;  Surgeon: Elveria Royals, MD;  Location: [facility];  Service: Gynecology;  Laterality: Bilateral;   CERVICAL DISC SURGERY  2012   fusion, Dr. Joya Salm   COLONOSCOPY  2011   Lionel December release Bilateral 2019   Dr. Burney Gauze    ROBOTIC ASSISTED TOTAL HYSTERECTOMY  09/10/2012   Procedure: ROBOTIC ASSISTED TOTAL HYSTERECTOMY;  Surgeon: Elveria Royals, MD;  Location: [facility];  Service: Gynecology;  Laterality: N/A;  3 hrs.   TUBAL LIGATION  1994    There were no vitals filed for this visit.   Subjective Assessment - 07/09/21 0848     Subjective Pt  reports she feels she is consistently improving but does still ache but improves with one aleve per day.    Limitations Walking;Standing    How long can you sit comfortably? no limitations    How long can you stand comfortably? no limitations    How long can you walk comfortably? 1.5-54miles and now able [place] the beach longer distances now    Currently in Pain? Yes    Pain Score 1     Pain Location Ankle    Pain Orientation Right    Pain Descriptors / Indicators Aching                               [facility] Adult PT Treatment/Exercise - 07/09/21 0001       Self-Care   Self-Care Other Self-Care Comments    Other Self-Care Comments  Pt educated on HEP additions, self scar mobilization, and progressions for activity at home. pt reported she was completing these at home and agreeable.      Exercises   Exercises Knee/Hip;Ankle      Lumbar Exercises: Standing   Functional Squats 10 reps      Knee/Hip Exercises: Stretches   Gastroc Stretch Right;Left;3 reps;30 seconds  Knee/Hip Exercises: Aerobic   Recumbent Bike L4 5 mins      Knee/Hip Exercises: Standing   Forward Lunges Both;2 sets;10 reps    Forward Lunges Limitations walking lunges      Manual Therapy   Manual Therapy Soft tissue mobilization    Manual therapy comments at posterior ankle, calf for improved tissue mobility. greatly improved since last session and pt educated on how to complete at home.      Ankle Exercises: Standing   Toe Raise 20 reps    Toe Raise Limitations 2x10    Other Standing Ankle Exercises SLS on foam 10x10s with minimal UE support;    Other Standing Ankle Exercises 2x10 heel raises,                     PT Education - 07/09/21 1000     Education Details Pt educated on scar mobilization techniques, exercise progressions at home and updated HEP    Person(s) Educated Patient    Methods Explanation;Demonstration;Tactile cues;Verbal cues;Handout     Comprehension Verbalized understanding;Returned demonstration              PT Short Term Goals - 07/09/21 1021       PT SHORT TERM GOAL #1   Title pt to be independent with HEP    Time 6    Period Weeks    Status Achieved    Target Date 05/03/21      PT SHORT TERM GOAL #2   Title pt to demonstrate improved strength to Rt ankle to at least 4/5 throughout for improved gait mechanics    Time 6    Period Weeks    Status Achieved    Target Date 05/03/21      PT SHORT TERM GOAL #3   Title pt to demonstrate improved bil hip strength to 5/5 throughout for decreased compensatory strategies with squats and single leg activities    Time 6    Period Weeks    Status Achieved    Target Date 05/03/21      PT SHORT TERM GOAL #4   Title pt to complete FOTO    Time 2    Period Weeks    Status Achieved    Target Date 04/05/21               PT Long Term Goals - 07/09/21 1021       PT LONG TERM GOAL #1   Title pt to be independent with advanced HEP    Time 4    Period Months    Status Achieved      PT LONG TERM GOAL #2   Title pt to demonstrate improved ankle strength to 5/5 for improved gait and stair mechanics    Time 4    Period Months    Status Achieved      PT LONG TERM GOAL #3   Title pt to report increased tolerance to walking for at least 1 hour with minimal to no pain for improved distances with community mobility    Time 4    Period Months    Status Not Met   45 mins usually per pt     PT LONG TERM GOAL #4   Title pt to demonstrate improved ROM at Rt ankle to [facility] ranges for improved ambulation and standing balance    Time 4    Period Months    Status Achieved      PT LONG TERM  GOAL #5   Title pt to demonstrate improved dynamic standing balance on Rt ankle without compensatory hip strategies    Time 4    Period Months    Status Achieved      PT LONG TERM GOAL #6   Title pt will have improved FOTO score to 64 for improved functional mobility     Baseline 49    Time 4    Period Months    Status Unable to assess                   Plan - 07/09/21 1001     Clinical Impression Statement Pt presents to clinic one month post last session for final visit and reports she has been complaint with HEP and workouts several times per week and progressing with activities as able. Pt educated on additions to HEP, scar mobilizations at ankle, massage at calf and ankle and workout progressions at home. pt reports she understands and denied questions with this but wanted to be able to continue to workout post PT, agreeable to DC today. Pt ROM assessed in long sitting with pt demonstrating functional ROM in all directions without pain though pt reports "stiffness" with mobility. Pt session focused on education, ankle mobility, strengthening and flexibility. Pt has met all goals and agreeable to DC without concerns at this time.    Personal Factors and Comorbidities Fitness    Examination-Activity Limitations Locomotion Level;Squat;Stairs;Stand;Lift;Carry    Examination-Participation Restrictions Community Activity;Driving;Shop;Yard Work    Stability/Clinical Decision Making Stable/Uncomplicated    Clinical Decision Making Low    Rehab Potential Good    PT Frequency 2x / week    PT Duration Other (comment)    PT Treatment/Interventions ADLs/Self Care Home Management;Gait training;Iontophoresis 4mg /ml Dexamethasone;Stair training;Functional mobility training;Therapeutic activities;Therapeutic exercise;Balance training;Neuromuscular re-education;Manual techniques;Patient/family education;Scar mobilization;Passive range of motion;Dry needling;Energy conservation;Aquatic Therapy    PT Home Exercise Plan Access Code: [email or web address]    Consulted and Agree with Plan of Care Patient             Patient will benefit from skilled therapeutic intervention in order to improve the following deficits and impairments:  Abnormal gait, Decreased range of motion,  Difficulty walking, Increased fascial restricitons, Decreased endurance, Pain, Hypomobility, Decreased activity tolerance, Decreased balance, Decreased scar mobility, Impaired flexibility, Improper body mechanics, Postural dysfunction, Decreased strength, Decreased mobility  Visit Diagnosis: Pain in right ankle and joints of right foot  Abnormality of gait and mobility  Muscle weakness (generalized)     Problem List Patient Active Problem List   Diagnosis Date Noted   Blood pressure check 07/24/2020   History of adenomatous polyp of colon 07/09/2020   Achilles tendinitis of both lower extremities 07/09/2020   Snoring 07/09/2020   Acid reflux 07/09/2020   History of basal cell carcinoma (BCC) 07/05/2020   Obesity (BMI 30.0-34.9) 02/09/2018   Anxiety 02/09/2018   Other abnormal glucose 05/23/2015   Medication management 05/23/2015   Vitamin D deficiency 05/23/2015   DDD (degenerative disc disease), cervical 05/23/2015   Essential hypertension 02/01/2010   Hyperlipidemia 01/31/2010   PHYSICAL THERAPY DISCHARGE SUMMARY  Visits from Start of Care: 15  Current functional level related to goals / functional outcomes: Pt has demonstrated great improvement with function, now normal ROM and reports only mild soreness in ankle with ability to walk longer distances and more time with activities.    Remaining deficits: Only walking ~45 mins per pt and not one hour without a break.  Education / Equipment: HEP   Patient agrees to discharge. Patient goals were partially met. Patient is being discharged due to being pleased with the current functional level.   Junie Panning, PT 07/09/2021, 10:32 AM  [facility] @ [place], [place], [address] Phone: [phone]   Fax:  [phone]  Name: Natasha Fritz MRN: [id number] Date of Birth: 1961/05/19

## 2021-07-09 NOTE — Progress Notes (Signed)
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  - Lipid panel  Other abnormal glucose Discussed general issues about diabetes pathophysiology and management., Educational material distributed., Suggested low cholesterol diet., Encouraged aerobic exercise., Discussed foot care., Reminded to get yearly retinal exam. - Hemoglobin A1c  Medication management - Magnesium  Vitamin D deficiency - Vit D  25 hydroxy (rtn osteoporosis monitoring)  DDD (degenerative disc disease), cervical Chronic pain, mobic PRN, ortho  Hx of basal cell carcinoma of nose Derm following annually  Overweight - BMI 27 Commended weight loss progress; strategies reviewed Recommended diet heavy in fruits and veggies and low in animal meats, cheeses, and dairy products, appropriate calorie intake Patient will work on portions, add exercise - try stationary cycle if tolerated  Discussed appropriate weight for height and initial goal <160 lb  Follow up at next visit  Snoring Also with some fatigue, intermittent AM HA, possible mild OSA She requests proceed with referral for sleep study after discussion  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   History of  adenomatous polyp of colon High fiber diet encouraged; reduce red meat and processed Next colonoscopy due 2024 per GI  Bilateral achilles tendonitis Dr. Milinda Pointer following PRN    Orders Placed This Encounter  Procedures   CBC with Differential/Platelet   COMPLETE METABOLIC PANEL WITH GFR   Magnesium   Lipid panel   TSH   Hemoglobin A1c   VITAMIN D 25 Hydroxy (Vit-D Deficiency, Fractures)   Microalbumin / creatinine urine ratio   Urinalysis, Routine w reflex microscopic   Ambulatory referral to Neurology   EKG 12-Lead     Discussed med's effects and SE's. Screening labs and tests as requested with regular follow-up as recommended. Over 40 minutes of exam, counseling, chart review, and complex, high level critical decision making was performed this visit.  Future Appointments  Date Time Provider [place]  07/10/2022 10:00 AM Liane Comber, NP [facility] None    HPI  60 y.o. female  presents for a complete physical. She has Hyperlipidemia; Essential hypertension; Other abnormal glucose; Medication management; Vitamin D deficiency; DDD (degenerative disc disease), cervical; Obesity (BMI 30.0-34.9); Anxiety; History of basal cell carcinoma (BCC); History of adenomatous polyp of colon; Achilles tendinitis of both lower extremities; Snoring; and Acid reflux on their problem list.  She is married, 3 grown kids, 2 daughters and a son, 3 grandchildren in [place]. Just built a house on [address], splitting time. She works for [employer] in [employer], works remotely.   Follows Dr. Benjie Karvonen at [facility] annually. Hx of hysterectomy sparing ovaries.   S/p achilles tendon repair after rupture on R by Dr. Milinda Pointer in 09/2020, also calcifications on L, just completed PT and reports taking aleve occasionally and managing well.   She follows with derm annually, Dr. Ledell Peoples office but he  retired, seeing new provider, history of BCC to nose   Her mom had lung cancer and passed away in 12-11-18.  Patent was on lexapro for depressive episode with benefit but has since tapered off and reports doing well. Has xanax PRN with rare use.   She reports new snoring in the 11-Dec-2018, without apenic episodes, does endorse chronic fatigue for several years, occasional AM headache, some AM throat soreness.   BMI is Body mass index is 28.6 kg/m., she has been working on diet, she was walking daily but recently with bilateral bilateral achilles tendonitis, has seen ortho/PT. Has been reducing carbs with nutrisystem type plan.  Wt Readings from Last 3 Encounters:  07/10/21 177 lb 3.2 oz (80.4 kg)  01/11/21 185 lb 3.2 oz (84 kg)  08/15/20 185 lb (83.9 kg)   Her blood pressure has not been controlled at home (120s/75-85), today their BP is BP: 130/82 She does workout. She denies chest pain, shortness of breath, dizziness.   She is not on cholesterol medication, had myalgias with pravastatin after a few months, newly on zetia and denies myalgias. Her cholesterol is not at goal. The cholesterol last visit was:   Lab Results  Component Value Date   CHOL 211 (H) 01/11/2021   HDL 37 (L) 01/11/2021   LDLCALC 139 (H) 01/11/2021   TRIG 209 (H) 01/11/2021   CHOLHDL 5.7 (H) 01/11/2021   She has been working on diet and exercise for prediabetes, and denies paresthesia of the feet, polydipsia, polyuria and visual disturbances. Last A1C in the office was:  Lab Results  Component Value Date   HGBA1C 5.8 (H) 01/11/2021   Patient is on Vitamin D supplement, taking 2000 IU.    Lab Results  Component Value Date   VD25OH 38 01/11/2021       Current Medications:  Current Outpatient Medications on File Prior to Visit  Medication Sig Dispense Refill   ALPRAZolam (XANAX) 0.5 MG tablet TAKE 1 TABLET BY MOUTH 2 TIMES DAILY AS NEEDED 60 tablet 0   aspirin 81 MG EC tablet Take 81 mg by mouth daily.     atenolol (TENORMIN) 25 MG tablet TAKE 1 TO 2 TABLETS AT BEDTIME FOR BLOOD PRESSURE AND MIGRAINE PROPHYLAXIS 180  tablet 3   cholecalciferol (VITAMIN D) 1000 UNITS tablet Take 1,000 Units by mouth daily.     ezetimibe (ZETIA) 10 MG tablet Take 1 tablet (10 mg total) by mouth daily. 90 tablet 3   Multiple Vitamin (MULTIVITAMIN) tablet Take 1 tablet by mouth daily.     olmesartan (BENICAR) 40 MG tablet TAKE 1 TABLET DAILY FOR BLOOD PRESSURE 90 tablet 3   Omega-3 Fatty Acids (FISH OIL) 1200 MG CAPS Take 1 capsule by mouth daily.     valACYclovir (VALTREX) 500 MG tablet Take     1 tablet     Daily  -  prophylaxis for Cold Sores / Fever Blisters (Patient taking differently: as needed. Take     1 tablet     Daily  -  prophylaxis for Cold Sores / Fever Blisters) 90 tablet 0   famotidine (PEPCID) 10 MG tablet Take 10 mg by mouth 2 (two) times daily as needed. (Patient not taking: Reported on 07/10/2021)     fluocinonide cream (LIDEX) 2.70 % Apply 1 application topically 2 (two) times daily.  (Patient not taking: Reported on 07/10/2021)     fluticasone (FLONASE) 50 MCG/ACT nasal spray Place 2 sprays into both nostrils at bedtime. (Patient not taking: Reported  on 07/10/2021) 16 g 1   ketoconazole (NIZORAL) 2 % cream Apply 1 application topically daily.  (Patient not taking: Reported on 07/10/2021)     meloxicam (MOBIC) 15 MG tablet Take 15 mg by mouth daily. (Patient not taking: Reported on 07/10/2021)     ondansetron (ZOFRAN) 4 MG tablet Take 1 tablet (4 mg total) by mouth every 8 (eight) hours as needed. (Patient not taking: Reported on 07/10/2021) 20 tablet 0   predniSONE (DELTASONE) 20 MG tablet 2 tablets daily for 3 days, 1 tablet daily for 4 days. (Patient not taking: Reported on 07/10/2021) 10 tablet 0   promethazine-dextromethorphan (PROMETHAZINE-DM) 6.25-15 MG/5ML syrup TAKE 1 TEASPOONFUL (5 mls) BY MOUTH 4 TIMES DAILY AS NEEDED FOR COUGH (Patient not taking: Reported on 07/10/2021) 240 mL 1   No current facility-administered medications on file prior to visit.   Health Maintenance:   Immunization History   Administered Date(s) Administered   Influenza Inj Mdck Quad Pf 07/02/2021   Influenza Inj Mdck Quad With Preservative 07/09/2020   Influenza-Unspecified 06/22/2013, 06/22/2017, 07/12/2018, 07/04/2019   PFIZER(Purple Top)SARS-COV-2 Vaccination 10/12/2019, 11/02/2019, 06/26/2020, 06/04/2021   Tdap 09/22/2006, 08/10/2017   Zoster Recombinat (Shingrix) 06/22/2017, 11/04/2017   Tetanus: 2018 Pneumovax: N/A Prevnar 13: N/A Flu vaccine: 2022 Shingrix: 2/2 Covid 19: 2/2, 2021, pfizer + booster 06/04/2021  LMP: 2013, TAH Pap: 2021 Dr. Benjie Karvonen, going annually - patient will send last date MGM: 02/2021 DEXA: N/A  Colonoscopy: 04/2020, [place], adenomatous polyp, due 3 years EGD: N/A  Last Dental Exam: [facility], last 2022 Last Eye Exam: Dr. Hassell Done, wears glasses, annually, last 2022 Last Derm Exam: Dr. Allyson Sabal annually, 07/2020, has upcoming.   Patient Care Team: Unk Pinto, MD as PCP - General (Internal Medicine) Elsie Saas, MD as Consulting Physician (Orthopedic Surgery) Monna Fam, MD as Consulting Physician (Ophthalmology) Fay Records, MD as Consulting Physician (Cardiology) Inda Castle, MD (Inactive) as Consulting Physician (Gastroenterology) Azucena Fallen, MD as Consulting Physician (Obstetrics and Gynecology) Leeroy Cha, MD as Consulting Physician (Neurosurgery) Druscilla Brownie, MD as Consulting Physician (Dermatology)  Medical History:  Past Medical History:  Diagnosis Date   Achilles tendinitis    Acid reflux 07/09/2020   Anxiety    Basal cell carcinoma of nose 05/23/2015   Endometrial hyperplasia without atypia, simple 09/10/2012   Endometrial hyperplasia without atypia, simple 09/10/2012   HSV-1 infection    HTN (hypertension)    Hyperlipidemia    Migraine    PONV (postoperative nausea and vomiting)    Allergies Allergies  Allergen Reactions   Penicillins     REACTION: hives   Pravastatin Other (See Comments)    myalgias     SURGICAL HISTORY She  has a past surgical history that includes Tubal ligation (1994); acl replacement; Cervical disc surgery (2012); Robotic assisted total hysterectomy (09/10/2012); Bilateral salpingectomy (09/10/2012); De Quervian's release (Bilateral, 2019); Colonoscopy (2011); and Achilles tendon surgery (Right, 10/19/2020). FAMILY HISTORY Her family history includes Alzheimer's disease in her father; Breast cancer in her mother; CVA (age of onset: 36) in her brother; Cancer in her maternal grandfather; Diabetes in her brother; Heart disease in her brother; Hyperlipidemia in her brother; Hypertension in her brother, brother, and mother; Lung cancer in her mother; Stroke in her mother. SOCIAL HISTORY She  reports that she has never smoked. She has been exposed to tobacco smoke. She has never used smokeless tobacco. She reports current alcohol use. She reports that she does not use drugs.  Review of Systems:  Review of  Systems  Constitutional: Negative.  Negative for malaise/fatigue and weight loss.  HENT:  Negative for congestion, ear discharge, ear pain, hearing loss, nosebleeds, sore throat and tinnitus.   Eyes: Negative.  Negative for blurred vision and double vision.  Respiratory: Negative.  Negative for cough, shortness of breath, wheezing and stridor.   Cardiovascular: Negative.  Negative for chest pain, palpitations, orthopnea, claudication and leg swelling.  Gastrointestinal: Negative.  Negative for abdominal pain, blood in stool, constipation, diarrhea, heartburn, melena, nausea and vomiting.  Genitourinary: Negative.   Musculoskeletal:  Negative for back pain, falls, joint pain, myalgias and neck pain.       Bil achilles pain/tenderness   Skin: Negative.  Negative for rash.  Neurological:  Negative for dizziness, tingling, tremors, sensory change, speech change, focal weakness, seizures, loss of consciousness, weakness and headaches.  Endo/Heme/Allergies: Negative.  Negative  for polydipsia.  Psychiatric/Behavioral:  Negative for depression, hallucinations, memory loss, substance abuse and suicidal ideas. The patient is not nervous/anxious (improved) and does not have insomnia.   All other systems reviewed and are negative.  Physical Exam: Estimated body mass index is 28.6 kg/m as calculated from the following:   Height as of this encounter: 5\' 6"  (1.676 m).   Weight as of this encounter: 177 lb 3.2 oz (80.4 kg). BP 130/82   Pulse 70   Temp (!) 97.3 F (36.3 C)   Ht 5\' 6"  (1.676 m)   Wt 177 lb 3.2 oz (80.4 kg)   LMP 09/05/2012   SpO2 97%   BMI 28.60 kg/m   Wt Readings from Last 3 Encounters:  07/10/21 177 lb 3.2 oz (80.4 kg)  01/11/21 185 lb 3.2 oz (84 kg)  08/15/20 185 lb (83.9 kg)   General Appearance: Well nourished, in no apparent distress.  Eyes: PERRLA, EOMs, conjunctiva no swelling or erythema Sinuses: No Frontal/maxillary tenderness  ENT/Mouth: Ext aud canals clear, normal light reflex with TMs without erythema, bulging. Good dentition. No erythema, swelling, or exudate on post pharynx. Tonsils not swollen or erythematous. Hearing normal.  Neck: Supple, thyroid normal. No bruits  Respiratory: Respiratory effort normal, BS equal bilaterally without rales, rhonchi, wheezing or stridor.  Cardio: Regular rhythm, brady, normal S1/S2 without murmurs, rubs or gallops. Brisk peripheral pulses without edema.  Chest: symmetric, with normal excursions and percussion.  Breasts: defer to GYN Abdomen: Soft, nontender, no guarding, rebound, hernias, masses, or organomegaly.  Lymphatics: Non tender without lymphadenopathy.  Genitourinary: defer to GYN Musculoskeletal: Full ROM all peripheral extremities,5/5 strength, and mildly antalgic gait. She has bilateral achilles thickening. Non-boggy/tender. Left with well healed surgical incision. Skin: Warm, dry without rashes, lesions, ecchymosis. Neuro: Cranial nerves intact, reflexes equal bilaterally. Normal  muscle tone, no cerebellar symptoms. Sensation intact.  Psych: Awake and oriented X 3, normal affect, Insight and Judgment appropriate.   EKG: Sinus brady, rate 48, AVB1  Vauda Salvucci C Georgean Spainhower 10:23 AM [facility]

## 2021-07-10 ENCOUNTER — Encounter: Payer: Self-pay | Admitting: Adult Health

## 2021-07-10 ENCOUNTER — Ambulatory Visit (INDEPENDENT_AMBULATORY_CARE_PROVIDER_SITE_OTHER): Payer: BC Managed Care – PPO | Admitting: Adult Health

## 2021-07-10 VITALS — BP 130/82 | HR 70 | Temp 97.3°F | Ht 66.0 in | Wt 177.2 lb

## 2021-07-10 DIAGNOSIS — E559 Vitamin D deficiency, unspecified: Secondary | ICD-10-CM | POA: Diagnosis not present

## 2021-07-10 DIAGNOSIS — Z1329 Encounter for screening for other suspected endocrine disorder: Secondary | ICD-10-CM

## 2021-07-10 DIAGNOSIS — Z136 Encounter for screening for cardiovascular disorders: Secondary | ICD-10-CM | POA: Diagnosis not present

## 2021-07-10 DIAGNOSIS — I10 Essential (primary) hypertension: Secondary | ICD-10-CM | POA: Diagnosis not present

## 2021-07-10 DIAGNOSIS — Z131 Encounter for screening for diabetes mellitus: Secondary | ICD-10-CM | POA: Diagnosis not present

## 2021-07-10 DIAGNOSIS — Z79899 Other long term (current) drug therapy: Secondary | ICD-10-CM | POA: Diagnosis not present

## 2021-07-10 DIAGNOSIS — R7309 Other abnormal glucose: Secondary | ICD-10-CM

## 2021-07-10 DIAGNOSIS — Z0001 Encounter for general adult medical examination with abnormal findings: Secondary | ICD-10-CM

## 2021-07-10 DIAGNOSIS — Z13220 Encounter for screening for lipoid disorders: Secondary | ICD-10-CM | POA: Diagnosis not present

## 2021-07-10 DIAGNOSIS — Z0000 Encounter for general adult medical examination without abnormal findings: Secondary | ICD-10-CM

## 2021-07-10 DIAGNOSIS — I440 Atrioventricular block, first degree: Secondary | ICD-10-CM

## 2021-07-10 DIAGNOSIS — R0683 Snoring: Secondary | ICD-10-CM

## 2021-07-10 DIAGNOSIS — E669 Obesity, unspecified: Secondary | ICD-10-CM

## 2021-07-10 DIAGNOSIS — E785 Hyperlipidemia, unspecified: Secondary | ICD-10-CM

## 2021-07-10 DIAGNOSIS — Z85828 Personal history of other malignant neoplasm of skin: Secondary | ICD-10-CM

## 2021-07-10 DIAGNOSIS — Z860101 Personal history of adenomatous and serrated colon polyps: Secondary | ICD-10-CM

## 2021-07-10 DIAGNOSIS — M5030 Other cervical disc degeneration, unspecified cervical region: Secondary | ICD-10-CM

## 2021-07-10 DIAGNOSIS — F325 Major depressive disorder, single episode, in full remission: Secondary | ICD-10-CM

## 2021-07-10 DIAGNOSIS — E66811 Obesity, class 1: Secondary | ICD-10-CM

## 2021-07-10 DIAGNOSIS — Z86010 Personal history of colonic polyps: Secondary | ICD-10-CM

## 2021-07-10 DIAGNOSIS — F419 Anxiety disorder, unspecified: Secondary | ICD-10-CM

## 2021-07-10 DIAGNOSIS — Z1389 Encounter for screening for other disorder: Secondary | ICD-10-CM

## 2021-07-10 DIAGNOSIS — K219 Gastro-esophageal reflux disease without esophagitis: Secondary | ICD-10-CM

## 2021-07-10 DIAGNOSIS — E663 Overweight: Secondary | ICD-10-CM

## 2021-07-10 NOTE — Patient Instructions (Addendum)
  Natasha Fritz , Thank you for taking time to come for your Annual Wellness Visit. I appreciate your ongoing commitment to your health goals. Please review the following plan we discussed and let me know if I can assist you in the future.   These are the goals we discussed:  Goals       Blood Pressure < 130/80      LDL CALC < 130      Weight (lb) < 170 lb (77.1 kg) (pt-stated)        This is a list of the screening recommended for you and due dates:  Health Maintenance  Topic Date Due   Pneumococcal Vaccination (1 - PCV) Never done   Pap Smear  01/01/2021   COVID-19 Vaccine (5 - Booster for Pfizer series) 10/04/2021   Mammogram  03/07/2023   Colon Cancer Screening  04/28/2023   Tetanus Vaccine  08/11/2027   Flu Shot  Completed   Hepatitis C Screening: USPSTF Recommendation to screen - Ages 18-79 yo.  Completed   HIV Screening  Completed   Zoster (Shingles) Vaccine  Completed   HPV Vaccine  Aged Out     Know what a healthy weight is for you (roughly BMI <25) and aim to maintain this  Aim for 7+ servings of fruits and vegetables daily  65-80+ fluid ounces of water or unsweet tea for healthy kidneys  Limit to max 1 drink of alcohol per day; avoid smoking/tobacco  Limit animal fats in diet for cholesterol and heart health - choose grass fed whenever available  Avoid highly processed foods, and foods high in saturated/trans fats  Aim for low stress - take time to unwind and care for your mental health  Aim for 150 min of moderate intensity exercise weekly for heart health, and weights twice weekly for bone health  Aim for 7-9 hours of sleep daily    A great goal to work towards is aiming to get in a serving daily of some of the most nutritionally dense foods - G- BOMBS daily

## 2021-07-11 ENCOUNTER — Ambulatory Visit: Payer: BC Managed Care – PPO | Admitting: Physical Therapy

## 2021-07-11 LAB — CBC WITH DIFFERENTIAL/PLATELET
Absolute Monocytes: 449 cells/uL (ref 200–950)
Basophils Absolute: 61 cells/uL (ref 0–200)
Basophils Relative: 0.9 %
Eosinophils Absolute: 102 cells/uL (ref 15–500)
Eosinophils Relative: 1.5 %
HCT: 42.6 % (ref 35.0–45.0)
Hemoglobin: 14.3 g/dL (ref 11.7–15.5)
Lymphs Abs: 1496 cells/uL (ref 850–3900)
MCH: 29.8 pg (ref 27.0–33.0)
MCHC: 33.6 g/dL (ref 32.0–36.0)
MCV: 88.8 fL (ref 80.0–100.0)
MPV: 11.5 fL (ref 7.5–12.5)
Monocytes Relative: 6.6 %
Neutro Abs: 4692 cells/uL (ref 1500–7800)
Neutrophils Relative %: 69 %
Platelets: 279 10*3/uL (ref 140–400)
RBC: 4.8 10*6/uL (ref 3.80–5.10)
RDW: 12.3 % (ref 11.0–15.0)
Total Lymphocyte: 22 %
WBC: 6.8 10*3/uL (ref 3.8–10.8)

## 2021-07-11 LAB — URINALYSIS, ROUTINE W REFLEX MICROSCOPIC
Bilirubin Urine: NEGATIVE
Glucose, UA: NEGATIVE
Hgb urine dipstick: NEGATIVE
Ketones, ur: NEGATIVE
Leukocytes,Ua: NEGATIVE
Nitrite: NEGATIVE
Protein, ur: NEGATIVE
Specific Gravity, Urine: 1.016 (ref 1.001–1.035)
pH: 6.5 (ref 5.0–8.0)

## 2021-07-11 LAB — MAGNESIUM: Magnesium: 2.3 mg/dL (ref 1.5–2.5)

## 2021-07-11 LAB — COMPLETE METABOLIC PANEL WITH GFR
AG Ratio: 1.8 (calc) (ref 1.0–2.5)
ALT: 24 U/L (ref 6–29)
AST: 21 U/L (ref 10–35)
Albumin: 4.6 g/dL (ref 3.6–5.1)
Alkaline phosphatase (APISO): 110 U/L (ref 37–153)
BUN: 22 mg/dL (ref 7–25)
CO2: 29 mmol/L (ref 20–32)
Calcium: 10.3 mg/dL (ref 8.6–10.4)
Chloride: 104 mmol/L (ref 98–110)
Creat: 0.77 mg/dL (ref 0.50–1.05)
Globulin: 2.5 g/dL (calc) (ref 1.9–3.7)
Glucose, Bld: 90 mg/dL (ref 65–99)
Potassium: 4.4 mmol/L (ref 3.5–5.3)
Sodium: 140 mmol/L (ref 135–146)
Total Bilirubin: 0.5 mg/dL (ref 0.2–1.2)
Total Protein: 7.1 g/dL (ref 6.1–8.1)
eGFR: 88 mL/min/{1.73_m2} (ref >=60)

## 2021-07-11 LAB — LIPID PANEL
Cholesterol: 170 mg/dL (ref <200)
HDL: 39 mg/dL — ABNORMAL LOW (ref >=50)
LDL Cholesterol (Calc): 105 mg/dL (calc) — ABNORMAL HIGH
Non-HDL Cholesterol (Calc): 131 mg/dL (calc) — ABNORMAL HIGH (ref <130)
Total CHOL/HDL Ratio: 4.4 (calc) (ref <5.0)
Triglycerides: 145 mg/dL (ref <150)

## 2021-07-11 LAB — TSH: TSH: 2.04 mIU/L (ref 0.40–4.50)

## 2021-07-11 LAB — HEMOGLOBIN A1C
Hgb A1c MFr Bld: 5.6 % of total Hgb (ref <5.7)
Mean Plasma Glucose: 114 mg/dL
eAG (mmol/L): 6.3 mmol/L

## 2021-07-11 LAB — MICROALBUMIN / CREATININE URINE RATIO
Creatinine, Urine: 70 mg/dL (ref 20–275)
Microalb Creat Ratio: 26 mcg/mg creat (ref <30)
Microalb, Ur: 1.8 mg/dL

## 2021-07-11 LAB — VITAMIN D 25 HYDROXY (VIT D DEFICIENCY, FRACTURES): Vit D, 25-Hydroxy: 48 ng/mL (ref 30–100)

## 2021-08-26 DIAGNOSIS — Z08 Encounter for follow-up examination after completed treatment for malignant neoplasm: Secondary | ICD-10-CM | POA: Diagnosis not present

## 2021-08-26 DIAGNOSIS — L814 Other melanin hyperpigmentation: Secondary | ICD-10-CM | POA: Diagnosis not present

## 2021-08-26 DIAGNOSIS — D225 Melanocytic nevi of trunk: Secondary | ICD-10-CM | POA: Diagnosis not present

## 2021-08-26 DIAGNOSIS — L821 Other seborrheic keratosis: Secondary | ICD-10-CM | POA: Diagnosis not present

## 2021-09-23 ENCOUNTER — Other Ambulatory Visit (HOSPITAL_COMMUNITY): Payer: Self-pay

## 2021-10-01 ENCOUNTER — Encounter: Payer: Self-pay | Admitting: Neurology

## 2021-10-01 ENCOUNTER — Ambulatory Visit (INDEPENDENT_AMBULATORY_CARE_PROVIDER_SITE_OTHER): Payer: BC Managed Care – PPO | Admitting: Neurology

## 2021-10-01 VITALS — BP 156/97 | HR 67 | Ht 66.5 in | Wt 180.0 lb

## 2021-10-01 DIAGNOSIS — R0683 Snoring: Secondary | ICD-10-CM

## 2021-10-01 DIAGNOSIS — M2619 Other specified anomalies of jaw-cranial base relationship: Secondary | ICD-10-CM | POA: Diagnosis not present

## 2021-10-01 DIAGNOSIS — G478 Other sleep disorders: Secondary | ICD-10-CM | POA: Diagnosis not present

## 2021-10-01 NOTE — Patient Instructions (Signed)

## 2021-10-01 NOTE — Progress Notes (Signed)
SLEEP MEDICINE CLINIC    Provider:  Larey Seat, MD  Primary Care Physician:  Unk Pinto, MD [address] [place] [address]     Referring Provider: Liane Comber, [address] [place],  [place] [address]          Chief Complaint according to patient   Patient presents with:     New Patient (Initial Visit)     Internal referral for snoring and fatigue. Here to r/o OSA. No prior SS. Pt noticed snoring about 3 years ago. Works from home.       HISTORY OF PRESENT ILLNESS:  Natasha Fritz is a 61 y.o.  patient who is seen here upon referral on 10/01/2021 from Dr. Melford Aase for a Sleep consultation. .  Chief concern according to patient :  Internal referral for snoring and fatigue. Here to r/o OSA. No prior SS. Pt noticed snoring about 3 years ago. Works from home.     Natasha Fritz  has a past medical history of Achilles tendinitis, Acid reflux (07/09/2020), Anxiety, Basal cell carcinoma of nose (05/23/2015), Endometrial hyperplasia without atypia, simple (09/10/2012), Endometrial hyperplasia without atypia, simple (09/10/2012), HSV-1 infection, HTN (hypertension), Hyperlipidemia, Migraine, and PONV (postoperative nausea and vomiting). Achilles tendon surgery 09-2020.      Sleep relevant medical history: snoring for about 3 years ( weight gain  pandemic) since mother's death in 2018/12/19.   Nocturia 1-3, no Tonsillectomy no other ENT surgery, cervical spine surgery anterior fusion.    Family medical /sleep history: Father with OSA.Natasha Fritz    Social history:  Patient is working as Engineer, technical sales for [employer] and lives in a household with spouse, adult 3 children-  one dog.  The patient currently works from home , often at night, too. 10 hour shifts, on call duties.  Tobacco use; never .  ETOH use ; social ,  Caffeine intake in form of Coffee( /) Soda( /) Tea ( 2-3 cups in Am and hot tea) ,nor energy drinks. Regular exercise in form of walking the  dog.    Sleep habits are as follows: The patient's dinner time is between 6-7 PM. The patient goes to bed at 8-12 PM and continues to sleep for 2-4 hours, wakes for 1-3 bathroom breaks.   The preferred sleep position is on her sides, with the support of 1-2 pillows. Snores in supine and on her side.  Dreams are reportedly infrequent.Natasha Fritz  6  AM is the usual rise time. The patient wakes up with an alarm.  She reports not feeling refreshed or restored in AM, with symptoms such as dry mouth, morning headaches, and residual fatigue.  Naps are taken infrequently, lasting from 60 to 120 minutes and are not refreshing.    Review of Systems: Out of a complete 14 system review, the patient complains of only the following symptoms, and all other reviewed systems are negative.:   HTN Fatigue, sleepiness , snoring, fragmented sleep,  Insomnia is rare.    How likely are you to doze in the following situations: 0 = not likely, 1 = slight chance, 2 = moderate chance, 3 = high chance   Sitting and Reading? Watching Television? Sitting inactive in a public place (theater or meeting)? As a passenger in a car for an hour without a break? Lying down in the afternoon when circumstances permit? Sitting and talking to someone? Sitting quietly after lunch without alcohol? In a car, while stopped for a  few minutes in traffic?   Total = 06/ 24 points   FSS endorsed at 39/ 63 points.  GDS: 1/ 15 , grief reaction, mother's estate is burdensome, disagreement with one of 2 brothers. .   Social History   Socioeconomic History   Marital status: Married    Spouse name: Natasha Fritz   Number of children: 3   Years of education: Not on file   Highest education level: Bachelor's degree (e.g., BA, AB, BS)  Occupational History   Not on file  Tobacco Use   Smoking status: Never    Passive exposure: Yes   Smokeless tobacco: Never   Tobacco comments:    tobacco use - no   Vaping Use   Vaping Use: Never used   Substance and Sexual Activity   Alcohol use: Yes    Alcohol/week: 0.0 standard drinks    Comment: rare   Drug use: No   Sexual activity: Not Currently    Partners: Male    Birth control/protection: Post-menopausal, Surgical  Other Topics Concern   Not on file  Social History Narrative   Married    Lives at home with husband   Right handed   Caffeine: 3 cups of tea with same tea bag.    full time, gets regular exercise.    Social Determinants of Health   Financial Resource Strain: Not on file  Food Insecurity: Not on file  Transportation Needs: Not on file  Physical Activity: Not on file  Stress: Not on file  Social Connections: Not on file    Family History  Problem Relation Age of Onset   Hypertension Mother    Stroke Mother    Breast cancer Mother    Lung cancer Mother    Alzheimer's disease Father    Hyperlipidemia Brother    Heart disease Brother    Diabetes Brother    Hypertension Brother    Hypertension Brother    CVA Brother 14   Cancer Maternal Grandfather    Esophageal cancer Neg Hx    Colon cancer Neg Hx    Colon polyps Neg Hx    Stomach cancer Neg Hx    Rectal cancer Neg Hx     Past Medical History:  Diagnosis Date   Achilles tendinitis    Acid reflux 07/09/2020   Anxiety    Basal cell carcinoma of nose 05/23/2015   Endometrial hyperplasia without atypia, simple 09/10/2012   Endometrial hyperplasia without atypia, simple 09/10/2012   HSV-1 infection    HTN (hypertension)    Hyperlipidemia    Migraine    PONV (postoperative nausea and vomiting)     Past Surgical History:  Procedure Laterality Date   ACHILLES TENDON SURGERY Right 10/19/2020   Dr. Milinda Pointer   acl replacement     BILATERAL SALPINGECTOMY  09/10/2012   Procedure: BILATERAL SALPINGECTOMY;  Surgeon: Elveria Royals, MD;  Location: [facility];  Service: Gynecology;  Laterality: Bilateral;   CERVICAL DISC SURGERY  2012   fusion, Dr. Joya Salm   COLONOSCOPY  2011   Lionel December release  Bilateral 2019   Dr. Burney Gauze    ROBOTIC ASSISTED TOTAL HYSTERECTOMY  09/10/2012   Procedure: ROBOTIC ASSISTED TOTAL HYSTERECTOMY;  Surgeon: Elveria Royals, MD;  Location: [facility];  Service: Gynecology;  Laterality: N/A;  3 hrs.   TUBAL LIGATION  1994     Current Outpatient Medications on File Prior to Visit  Medication Sig Dispense Refill   ALPRAZolam (XANAX) 0.5 MG tablet TAKE 1  TABLET BY MOUTH 2 TIMES DAILY AS NEEDED 60 tablet 0   aspirin 81 MG EC tablet Take 81 mg by mouth daily.     cholecalciferol (VITAMIN D) 1000 UNITS tablet Take 1,000 Units by mouth daily.     ezetimibe (ZETIA) 10 MG tablet Take 1 tablet (10 mg total) by mouth daily. 90 tablet 3   famotidine (PEPCID) 10 MG tablet Take 10 mg by mouth 2 (two) times daily as needed.     fluocinonide cream (LIDEX) 5.32 % Apply 1 application topically 2 (two) times daily.     fluticasone (FLONASE) 50 MCG/ACT nasal spray Place 2 sprays into both nostrils at bedtime. 16 g 1   ketoconazole (NIZORAL) 2 % cream Apply 1 application topically daily.     meloxicam (MOBIC) 15 MG tablet Take 15 mg by mouth daily.     Multiple Vitamin (MULTIVITAMIN) tablet Take 1 tablet by mouth daily.     olmesartan (BENICAR) 40 MG tablet TAKE 1 TABLET DAILY FOR BLOOD PRESSURE 90 tablet 3   Omega-3 Fatty Acids (FISH OIL) 1200 MG CAPS Take 1 capsule by mouth daily.     promethazine-dextromethorphan (PROMETHAZINE-DM) 6.25-15 MG/5ML syrup TAKE 1 TEASPOONFUL (5 mls) BY MOUTH 4 TIMES DAILY AS NEEDED FOR COUGH 240 mL 1   valACYclovir (VALTREX) 500 MG tablet Take     1 tablet     Daily  -  prophylaxis for Cold Sores / Fever Blisters (Patient taking differently: as needed. Take     1 tablet     Daily  -  prophylaxis for Cold Sores / Fever Blisters) 90 tablet 0   No current facility-administered medications on file prior to visit.    Allergies  Allergen Reactions   Penicillins     REACTION: hives   Pravastatin Other (See Comments)    myalgias   @lab : Mrs. Lagrand was  tested last year for an HgbA1c which was elevated at 5.8 this could be the prediabetic range, her cholesterol at the time was 211 total which is also elevated her vitamin D score was 38 which is in the low normal range.  She endorsed snoring since 2020 so far without apneic episodes being witnessed but she is chronically more fatigued for several years occasionally wakes up with headaches and pressure sore throat or dry mouth.  She has a history of an AV block first-degree that was coincidentally found many years ago but follow-up with cardiology did not reveal any abnormalities or arrhythmias.    Physical exam:  Today's Vitals   10/01/21 0853  BP: (!) 156/97  Pulse: 67  Weight: 180 lb (81.6 kg)  Height: 5' 6.5" (1.689 m)   Body mass index is 28.62 kg/m.   Wt Readings from Last 3 Encounters:  10/01/21 180 lb (81.6 kg)  07/10/21 177 lb 3.2 oz (80.4 kg)  01/11/21 185 lb 3.2 oz (84 kg)     Ht Readings from Last 3 Encounters:  10/01/21 5' 6.5" (1.689 m)  07/10/21 5\' 6"  (1.676 m)  07/09/20 5' 5.75" (1.67 m)      General: The patient is awake, alert and appears not in acute distress. The patient is well groomed. Head: Normocephalic, atraumatic. Neck is supple.  Mallampati 3,  neck circumference:15 inches . Nasal airflow  patent.  Retrognathia is  seen.  Dental status: crowded, small jaw.  Cardiovascular:  Regular rate and cardiac rhythm by pulse,  without distended neck veins. Respiratory: Lungs are clear to auscultation.  Skin:  Without evidence  of ankle edema, or rash. Trunk: The patient's posture is erect.   Neurologic exam : The patient is awake and alert, oriented to place and time.   Memory subjective described as intact.  Attention span & concentration ability appears normal.  Speech is fluent,  without  dysarthria, dysphonia or aphasia.  Mood and affect are appropriate.   Cranial nerves: no loss of smell or taste reported  Pupils are equal and briskly reactive to  light. Funduscopic exam deferred..  Extraocular movements in vertical and horizontal planes were intact and without nystagmus. No Diplopia. Visual fields by finger perimetry are intact. Hearing was intact to soft voice and finger rubbing.    Facial sensation intact to fine touch.  Facial motor strength is symmetric and tongue and uvula move midline.  Neck ROM : rotation, tilt and flexion extension were normal for age and shoulder shrug was symmetrical.    Motor exam:  Symmetric bulk, tone and ROM.   Normal tone without cog -wheeling, symmetric grip strength .   Sensory:  Fine touch and vibration were normal, except decreased at the right medial ankle. Natasha Fritz  Proprioception tested in the upper extremities was normal.   Coordination: Rapid alternating movements in the fingers/hands were of normal speed.  The Finger-to-nose maneuver was intact without evidence of ataxia, dysmetria or tremor.   Gait and station: Patient could rise unassisted from a seated position, walked without assistive device.  Stance is of normal width/ base and the patient turned with 3 steps.  Toe and heel walk were deferred.  Deep tendon reflexes: in the  upper and lower extremities are symmetric and intact.  Babinski response was deferred.        After spending a total time of  40  minutes face to face and additional time for physical and neurologic examination, review of laboratory studies,  personal review of imaging studies, reports and results of other testing and review of referral information / records as far as provided in visit, I have established the following assessments:  1) The patient endorsed high degree of fatigue  2) non restorative sleep, EDS.  3) Snoring in all positions, according to husband.    My Plan is to proceed with:  1) HST or in lab study.  Screening for sleep apnea, no seizure history, neither stroke, nor CHF and no RLS.    I would like to thank Unk Pinto, MD and Liane Comber, [address] [place],  [place] [address] for allowing me to meet with and to take care of this pleasant patient.   [name] will follow up either personally or through our NP within 2-4 months.   CC: I will share my notes with PCP. Natasha Fritz  Electronically signed by: Larey Seat, MD 10/01/2021 9:27 AM  [facility] Board certified by The [employer] of Sleep Medicine and Diplomate of the [employer] of Sleep Medicine. Board certified In Neurology through the [employer], Fellow of the [employer] of Neurology. Medical Director of [facility].

## 2021-10-03 ENCOUNTER — Other Ambulatory Visit (HOSPITAL_COMMUNITY): Payer: Self-pay

## 2021-10-08 DIAGNOSIS — Z01419 Encounter for gynecological examination (general) (routine) without abnormal findings: Secondary | ICD-10-CM | POA: Diagnosis not present

## 2021-10-08 DIAGNOSIS — Z6828 Body mass index (BMI) 28.0-28.9, adult: Secondary | ICD-10-CM | POA: Diagnosis not present

## 2021-11-05 ENCOUNTER — Encounter: Payer: Self-pay | Admitting: Neurology

## 2021-11-06 ENCOUNTER — Ambulatory Visit (INDEPENDENT_AMBULATORY_CARE_PROVIDER_SITE_OTHER): Payer: BC Managed Care – PPO | Admitting: Neurology

## 2021-11-06 DIAGNOSIS — M2619 Other specified anomalies of jaw-cranial base relationship: Secondary | ICD-10-CM

## 2021-11-06 DIAGNOSIS — G478 Other sleep disorders: Secondary | ICD-10-CM

## 2021-11-06 DIAGNOSIS — G4733 Obstructive sleep apnea (adult) (pediatric): Secondary | ICD-10-CM

## 2021-11-06 DIAGNOSIS — R0683 Snoring: Secondary | ICD-10-CM

## 2021-11-08 ENCOUNTER — Encounter: Payer: Self-pay | Admitting: Adult Health

## 2021-11-11 NOTE — Progress Notes (Signed)
[facility] TEST REPORT ( by Watch PAT)   STUDY DATE:  11-07-2021   ORDERING CLINICIAN: Larey Seat, MD  REFERRING CLINICIAN:   Liane Comber, NP / Unk Pinto, MD CLINICAL INFORMATION/HISTORY: Natasha Fritz is a 61 y.o.  patient who is seen here upon referral on 10/01/2021 from Dr. Melford Aase for a Sleep consultation. .  Chief concern: Here to r/o OSA.  Pt noticed snoring about 3 years ago.      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 Retrognathia.Prediabetes and AV block.  Snoring for about 3 years ( weight gain  pandemic) since mother's death in 2018-12-28.   Nocturia 1-3, no Tonsillectomy no other ENT surgery, but this patient had a cervical spine surgery/ anterior fusion. Family sleep history: Father with OSA.[name]    Epworth sleepiness score: 6/24.  FSS at 39/63 points    BMI: 28 kg/m   Neck Circumference: 15"    FINDINGS:   Sleep Summary:   Total Recording Time (hours, min): Total recording time was 8 hours and 39 minutes of which the total sleep time was 7 hours and 32 minutes with 20.5% REM sleep time.                                         Respiratory Indices:   Calculated pAHI (per hour): Overall apnea hypopnea index was 28.8/h, slightly higher and REM sleep at 32.1/h then in non-REM sleep at 28/h                           Positional supine sleep clearly exacerbated the apnea hypopnea index at an AHI of 52.8.  The patient slept for 52 minutes in supine prone sleep was associated with an AHI of 15.8 left-sided sleep with an AHI of 18.8/h.  The patient slept 142 minutes on her right side and here again the AHI was 38.5.    Snoring  was recorded with a mean volume of 43 dB and was present for almost 50% of the night.                                                   Oxygen Saturation Statistics:   O2 Saturation Range (%): Varied between a nadir of 84 and a maximum of 98% with a mean oxygenation at 94%.                                      O2 Saturation (minutes) <89%:    1.4 minutes       Pulse Rate Statistics:         Pulse Range:   Heart rate varied between 46 and 94 bpm with a mean heart rate of 64 bpm.              IMPRESSION:  This HST confirms  the presence of moderately severe sleep apnea of obstructive origin.  Hypoxia was not clinically relevant but there were clearly some bradycardic.  And sleep apnea was not REM sleep dependent.  Oxygen saturation however seems to vary the most during REM sleep.  Snoring was moderate severe.   RECOMMENDATION: This degree of apnea warrants intervention.  The patient could choose between CPAP, a dental device and she could also potentially use an implanted inspire device.    I recommend to start with positive airway pressure therapy and see if the patient can tolerate it well.  As the patient is retrognathia I would like for her not to use a full facemask but rather a nasal pillow or nasal interface and if necessary a chinstrap. An auto-titration CPAP device should be set up between 6 and 17 cmH2O pressure with 2 cm EPR, heated humidification and above instructions for a mask fitting.  The patient also should  be furbished with a ResMed device that is Wi-Fi compatible.    INTERPRETING PHYSICIAN:   Larey Seat, MD   Medical Director of [facility] at [facility].

## 2021-11-19 DIAGNOSIS — G4733 Obstructive sleep apnea (adult) (pediatric): Secondary | ICD-10-CM | POA: Insufficient documentation

## 2021-11-19 NOTE — Addendum Note (Signed)
Addended by: Larey Seat on: 11/19/2021 06:23 PM   Modules accepted: Orders

## 2021-11-19 NOTE — Progress Notes (Signed)
IMPRESSION:  This HST confirms the presence of moderately severe sleep apnea of obstructive origin.  Hypoxia was not clinically relevant but there were clearly some bradycardic periods.  This sleep apnea was not REM sleep dependent.  Oxygen saturation however seems to vary the most during REM sleep.   Snoring was moderate severe.  RECOMMENDATION: This degree of apnea warrants intervention.  The patient could choose between CPAP, a dental device and she could also potentially use an implanted inspire device.    I recommend to start with positive airway pressure therapy and see if the patient can tolerate it well.  As the patient has  retrognathia I would like for her not to use a full facemask but rather a nasal pillow or nasal interface and if necessary a chinstrap. An auto-titration CPAP device should be set up between 6 and 17 cmH2O pressure with 2 cm EPR, heated humidification and above instructions for a mask fitting.  The patient also should  be furbished with a ResMed device that is Wi-Fi compatible.

## 2021-11-19 NOTE — Procedures (Signed)
[facility] TEST REPORT ( by Watch PAT)   STUDY DATE:  11-07-2021   ORDERING CLINICIAN: Larey Seat, MD  REFERRING CLINICIAN:   Liane Comber, NP / Unk Pinto, MD CLINICAL INFORMATION/HISTORY: Natasha Fritz is a 61 y.o.  patient who is seen here upon referral on 10/01/2021 from Dr. Melford Aase for a Sleep consultation. .  Chief concern: Here to r/o OSA.  Pt noticed snoring about 3 years ago.      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 Retrognathia.Prediabetes and AV block.  Snoring for about 3 years ( weight gain  pandemic) since mother's death in 2018/12/19.   Nocturia 1-3, no Tonsillectomy no other ENT surgery, but this patient had a cervical spine surgery/ anterior fusion. Family sleep history: Father with OSA.[name]    Epworth sleepiness score: 6/24.  FSS at 39/63 points    BMI: 28 kg/m   Neck Circumference: 15"    FINDINGS:   Sleep Summary:   Total Recording Time (hours, min): Total recording time was 8 hours and 39 minutes of which the total sleep time was 7 hours and 32 minutes with 20.5% REM sleep time.                                         Respiratory Indices:   Calculated pAHI (per hour): Overall apnea hypopnea index was 28.8/h, slightly higher and REM sleep at 32.1/h then in non-REM sleep at 28/h                           Positional supine sleep clearly exacerbated the apnea hypopnea index at an AHI of 52.8.  The patient slept for 52 minutes in supine prone sleep was associated with an AHI of 15.8 left-sided sleep with an AHI of 18.8/h.  The patient slept 142 minutes on her right side and here again the AHI was 38.5.    Snoring was  recorded with a mean volume of 43 dB and was present for almost 50% of the night.                                                   Oxygen Saturation Statistics:   O2 Saturation Range (%): Varied between a nadir of 84 and a maximum of 98% with a mean oxygenation at 94%.                                      O2 Saturation (minutes) <89%:    1.4 minutes       Pulse Rate Statistics:         Pulse Range:   Heart rate varied between 46 and 94 bpm with a mean heart rate of 64 bpm.              IMPRESSION:  This HST confirms the presence of  moderately severe sleep apnea of obstructive origin.  Hypoxia was not clinically relevant but there were clearly some bradycardic.  And sleep apnea was not REM sleep dependent.  Oxygen saturation however seems to vary the most during REM sleep.  Snoring was moderate severe.   RECOMMENDATION: This degree of apnea warrants intervention.  The patient could choose between CPAP, a dental device and she could also potentially use an implanted inspire device.    I recommend to start with positive airway pressure therapy and see if the patient can tolerate it well.  As the patient is retrognathia I would like for her not to use a full facemask but rather a nasal pillow or nasal interface and if necessary a chinstrap. An auto-titration CPAP device should be set up between 6 and 17 cmH2O pressure with 2 cm EPR, heated humidification and above instructions for a mask fitting.  The patient also should  be furbished with a ResMed device that is Wi-Fi compatible.    INTERPRETING PHYSICIAN:   Larey Seat, MD   Medical Director of [facility] at [facility].

## 2021-11-20 ENCOUNTER — Telehealth: Payer: Self-pay | Admitting: *Deleted

## 2021-11-20 ENCOUNTER — Encounter: Payer: Self-pay | Admitting: *Deleted

## 2021-11-20 NOTE — Telephone Encounter (Signed)
I called pt. I advised pt that Dr. Brett Fairy reviewed their sleep study results and found that pt has sleep apnea. Dr. Brett Fairy recommends that pt start cpap. I reviewed PAP compliance expectations with the pt. Pt is agreeable to starting a CPAP. I advised pt that an order will be sent to a DME, [facility], and [facility] will call the pt within about one week after they file with the pt's insurance. [facility] will show the pt how to use the machine, fit for masks, and troubleshoot the CPAP if needed. A follow up appt was made for insurance purposes with Dr. Brett Fairy on 02/05/22 at 2:30pm. Pt verbalized understanding to arrive 15 minutes early and bring their CPAP. A letter with all of this information in it will be mailed to the pt as a reminder. I verified with the pt that the address we have on file is correct. Pt verbalized understanding of results. Pt had no questions at this time but was encouraged to call back if questions arise. I have sent the order to [facility] via fax and have received confirmation that they have received the order. ? ?

## 2021-11-20 NOTE — Telephone Encounter (Signed)
-----   Message from Larey Seat, MD sent at 11/19/2021  6:23 PM EST ----- ?IMPRESSION:  This HST confirms the presence of moderately severe sleep apnea of obstructive origin.  Hypoxia was not clinically relevant but there were clearly some bradycardic periods.  This sleep apnea was not REM sleep dependent.  Oxygen saturation however seems to vary the most during REM sleep.   ?Snoring was moderate severe. ? ?RECOMMENDATION: This degree of apnea warrants intervention.  The patient could choose between CPAP, a dental device and she could also potentially use an implanted inspire device.  ?? ? I recommend to start with positive airway pressure therapy and see if the patient can tolerate it well.  As the patient has  retrognathia I would like for her not to use a full facemask but rather a nasal pillow or nasal interface and if necessary a chinstrap. ?An auto-titration CPAP device should be set up between 6 and 17 cmH2O pressure with 2 cm EPR, heated humidification and above instructions for a mask fitting.  The patient also should  be furbished with a ResMed device that is Wi-Fi compatible. ?? ?? ?

## 2021-11-25 DIAGNOSIS — G4733 Obstructive sleep apnea (adult) (pediatric): Secondary | ICD-10-CM | POA: Diagnosis not present

## 2021-12-13 ENCOUNTER — Other Ambulatory Visit (HOSPITAL_COMMUNITY): Payer: Self-pay

## 2021-12-26 DIAGNOSIS — G4733 Obstructive sleep apnea (adult) (pediatric): Secondary | ICD-10-CM | POA: Diagnosis not present

## 2021-12-28 ENCOUNTER — Telehealth: Payer: BC Managed Care – PPO | Admitting: Nurse Practitioner

## 2021-12-28 DIAGNOSIS — J40 Bronchitis, not specified as acute or chronic: Secondary | ICD-10-CM

## 2021-12-28 MED ORDER — AZITHROMYCIN 250 MG PO TABS: ORAL_TABLET | ORAL | 0 refills | Status: DC

## 2021-12-28 MED ORDER — PREDNISONE 20 MG PO TABS: 40.0000 mg | ORAL_TABLET | Freq: Every day | ORAL | 0 refills | Status: AC

## 2021-12-28 NOTE — Progress Notes (Signed)
? ?Virtual Visit Consent  ? ?Natasha Fritz, you are scheduled for a virtual visit with Natasha Fritz, [place], a [facility] provider, today.   ?  ?Just as with appointments in the office, your consent must be obtained to participate.  Your consent will be active for this visit and any virtual visit you may have with one of our providers in the next 365 days.   ?  ?If you have a MyChart account, a copy of this consent can be sent to you electronically.  All virtual visits are billed to your insurance company just like a traditional visit in the office.   ? ?As this is a virtual visit, video technology does not allow for your provider to perform a traditional examination.  This may limit your provider's ability to fully assess your condition.  If your provider identifies any concerns that need to be evaluated in person or the need to arrange testing (such as labs, EKG, etc.), we will make arrangements to do so.   ?  ?Although advances in technology are sophisticated, we cannot ensure that it will always work on either your end or our end.  If the connection with a video visit is poor, the visit may have to be switched to a telephone visit.  With either a video or telephone visit, we are not always able to ensure that we have a secure connection.    ? ?I need to obtain your verbal consent now.   Are you willing to proceed with your visit today? YES ?  ?SHAKAYLA HICKOX has provided verbal consent on 12/28/2021 for a virtual visit (video or telephone). ?  ?Natasha Hassell Done, FNP  ? ?Date: 12/28/2021 7:28 PM ? ? ?Virtual Visit via Video Note  ? ?I, Natasha Fritz, connected with Natasha Fritz ([id number], [birth year]) on 12/28/21 at  7:30 PM EDT by a video-enabled telemedicine application and verified that I am speaking with the correct person using two identifiers. ? ?Location: ?Patient: Virtual Visit Location Patient: Home ?Provider: Virtual Visit Location Provider: Mobile ?  ?I discussed the limitations of  evaluation and management by telemedicine and the availability of in person appointments. The patient expressed understanding and agreed to proceed.   ? ?History of Present Illness: ?Natasha Fritz is a 61 y.o. who identifies as a female who was assigned female at birth, and is being seen today for uri. ? ?HPI: URI  ?This is a new problem. The current episode started in the past 7 days. The problem has been gradually worsening. There has been no fever. Associated symptoms include congestion, coughing, headaches, rhinorrhea and a sore throat. Associated symptoms comments: Cough has gotten worse the last 2 days and she is wheezing some.. She has tried antihistamine and NSAIDs for the symptoms. The treatment provided mild relief.  Tested negative for covid. ? ?Review of Systems  ?HENT:  Positive for congestion, rhinorrhea and sore throat.   ?Respiratory:  Positive for cough.   ?Neurological:  Positive for headaches.  ? ?Problems:  ?Patient Active Problem List  ? Diagnosis Date Noted  ? Moderate obstructive sleep apnea-hypopnea syndrome 11/19/2021  ? 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 Non-restorative sleep 10/01/2021  ? Major depressive disorder, single episode, in remission ([place]) 07/10/2021  ? AV block, 1st degree 07/10/2021  ? History of adenomatous polyp of colon 07/09/2020  ? Achilles tendinitis of both lower extremities 07/09/2020  ? Snoring 07/09/2020  ? Acid reflux 07/09/2020  ? History of basal cell carcinoma (BCC) 07/05/2020  ?  Overweight (BMI 25.0-29.9) 02/09/2018  ? Anxiety 02/09/2018  ? Other abnormal glucose 05/23/2015  ? Medication management 05/23/2015  ? Vitamin D deficiency 05/23/2015  ? DDD (degenerative disc disease), cervical 05/23/2015  ? Essential hypertension 02/01/2010  ? Hyperlipidemia 01/31/2010  ?  ?Allergies:  ?Allergies  ?Allergen Reactions  ? Penicillins   ?  REACTION: hives  ? Pravastatin Other (See Comments)  ?  myalgias  ? ?Medications:  ?Current Outpatient Medications:  ?  ALPRAZolam (XANAX) 0.5  MG tablet, TAKE 1 TABLET BY MOUTH 2 TIMES DAILY AS NEEDED, Disp: 60 tablet, Rfl: 0 ?  aspirin 81 MG EC tablet, Take 81 mg by mouth daily., Disp: , Rfl:  ?  cholecalciferol (VITAMIN D) 1000 UNITS tablet, Take 1,000 Units by mouth daily., Disp: , Rfl:  ?  ezetimibe (ZETIA) 10 MG tablet, Take 1 tablet (10 mg total) by mouth daily., Disp: 90 tablet, Rfl: 3 ?  famotidine (PEPCID) 10 MG tablet, Take 10 mg by mouth 2 (two) times daily as needed., Disp: , Rfl:  ?  fluocinonide cream (LIDEX) 9.89 %, Apply 1 application topically 2 (two) times daily., Disp: , Rfl:  ?  fluticasone (FLONASE) 50 MCG/ACT nasal spray, Place 2 sprays into both nostrils at bedtime., Disp: 16 g, Rfl: 1 ?  ketoconazole (NIZORAL) 2 % cream, Apply 1 application topically daily., Disp: , Rfl:  ?  meloxicam (MOBIC) 15 MG tablet, Take 15 mg by mouth daily., Disp: , Rfl:  ?  Multiple Vitamin (MULTIVITAMIN) tablet, Take 1 tablet by mouth daily., Disp: , Rfl:  ?  olmesartan (BENICAR) 40 MG tablet, TAKE 1 TABLET DAILY FOR BLOOD PRESSURE, Disp: 90 tablet, Rfl: 3 ?  Omega-3 Fatty Acids (FISH OIL) 1200 MG CAPS, Take 1 capsule by mouth daily., Disp: , Rfl:  ?  promethazine-dextromethorphan (PROMETHAZINE-DM) 6.25-15 MG/5ML syrup, TAKE 1 TEASPOONFUL (5 mls) BY MOUTH 4 TIMES DAILY AS NEEDED FOR COUGH, Disp: 240 mL, Rfl: 1 ?  valACYclovir (VALTREX) 500 MG tablet, Take     1 tablet     Daily  -  prophylaxis for Cold Sores / Fever Blisters (Patient taking differently: as needed. Take     1 tablet     Daily  -  prophylaxis for Cold Sores / Fever Blisters), Disp: 90 tablet, Rfl: 0 ? ?Observations/Objective: ?Patient is well-developed, well-nourished in no acute distress.  ?Resting comfortably  at home.  ?Head is normocephalic, atraumatic.  ?No labored breathing.  ?Speech is clear and coherent with logical content.  ?Patient is alert and oriented at baseline.  ?Raspy voice ? ?Assessment and Plan: ? ?Natasha Fritz in today with chief complaint of URI ? ? ?1. Bronchitis ?1.  Take meds as prescribed ?2. Use a cool mist humidifier especially during the winter months and when heat has been humid. ?3. Use saline nose sprays frequently ?4. Saline irrigations of the nose can be very helpful if Fritz frequently. ? * 4X daily for 1 week* ? * Use of a nettie pot can be helpful with this. Follow directions with this* ?5. Drink plenty of fluids ?6. Keep thermostat turn down low ?7.For any cough or congestion- promethazine DM (pt already has some ) ?8. For fever or aces or pains- take tylenol or ibuprofen appropriate for age and weight. ? * for fevers greater than 101 orally you may alternate ibuprofen and tylenol every  3 hours. ?  ?Meds ordered this encounter  ?Medications  ? azithromycin (ZITHROMAX Z-PAK) 250 MG tablet  ?  Sig:  As directed  ?  Dispense:  6 tablet  ?  Refill:  0  ?  Order Specific Question:   Supervising Provider  ?  Answer:   Noemi Chapel [[id number]]  ? predniSONE (DELTASONE) 20 MG tablet  ?  Sig: Take 2 tablets (40 mg total) by mouth daily with breakfast for 5 days. 2 po daily for 5 days  ?  Dispense:  10 tablet  ?  Refill:  0  ?  Order Specific Question:   Supervising Provider  ?  Answer:   Noemi Chapel [[id number]]  ? ? ? ? ? ?Follow Up Instructions: ?I discussed the assessment and treatment plan with the patient. The patient was provided an opportunity to ask questions and all were answered. The patient agreed with the plan and demonstrated an understanding of the instructions.  A copy of instructions were sent to the patient via MyChart. ? ?The patient was advised to call back or seek an in-person evaluation if the symptoms worsen or if the condition fails to improve as anticipated. ? ?Time:  ?I spent 9 minutes with the patient via telehealth technology discussing the above problems/concerns.   ? ?Natasha Hassell Done, FNP ? ?

## 2021-12-28 NOTE — Patient Instructions (Signed)

## 2022-01-02 ENCOUNTER — Other Ambulatory Visit: Payer: Self-pay | Admitting: Adult Health

## 2022-01-08 ENCOUNTER — Ambulatory Visit: Payer: BC Managed Care – PPO | Admitting: Adult Health

## 2022-01-22 ENCOUNTER — Ambulatory Visit (INDEPENDENT_AMBULATORY_CARE_PROVIDER_SITE_OTHER): Payer: BC Managed Care – PPO | Admitting: Adult Health

## 2022-01-22 ENCOUNTER — Encounter: Payer: Self-pay | Admitting: Adult Health

## 2022-01-22 VITALS — BP 132/80 | HR 80 | Temp 97.9°F | Resp 16 | Ht 66.5 in | Wt 183.0 lb

## 2022-01-22 DIAGNOSIS — Z79899 Other long term (current) drug therapy: Secondary | ICD-10-CM | POA: Diagnosis not present

## 2022-01-22 DIAGNOSIS — E663 Overweight: Secondary | ICD-10-CM

## 2022-01-22 DIAGNOSIS — M7701 Medial epicondylitis, right elbow: Secondary | ICD-10-CM

## 2022-01-22 DIAGNOSIS — G4733 Obstructive sleep apnea (adult) (pediatric): Secondary | ICD-10-CM | POA: Diagnosis not present

## 2022-01-22 DIAGNOSIS — E785 Hyperlipidemia, unspecified: Secondary | ICD-10-CM

## 2022-01-22 DIAGNOSIS — Z23 Encounter for immunization: Secondary | ICD-10-CM

## 2022-01-22 DIAGNOSIS — F325 Major depressive disorder, single episode, in full remission: Secondary | ICD-10-CM

## 2022-01-22 DIAGNOSIS — I10 Essential (primary) hypertension: Secondary | ICD-10-CM | POA: Diagnosis not present

## 2022-01-22 DIAGNOSIS — R7309 Other abnormal glucose: Secondary | ICD-10-CM

## 2022-01-22 DIAGNOSIS — E559 Vitamin D deficiency, unspecified: Secondary | ICD-10-CM

## 2022-01-22 MED ORDER — MELOXICAM 15 MG PO TABS: ORAL_TABLET | ORAL | 1 refills | Status: DC

## 2022-01-22 MED ORDER — PREDNISONE 20 MG PO TABS: ORAL_TABLET | ORAL | 0 refills | Status: DC

## 2022-01-22 NOTE — Progress Notes (Signed)
6MOV ? ?Assessment and Plan: ? ? ?Essential hypertension ?Continue meds ?Monitor blood pressure at home; call if consistently over 130/80 ?Continue DASH diet.   ?Reminder to go to the ER if any CP, SOB, nausea, dizziness, severe HA, changes vision/speech, left arm numbness and tingling and jaw pain. ?- CBC with Differential/Platelet ?- CMP/GFR ? ? Hyperlipidemia ?-myalgia with pravastatin 20 mg  ?- onzetia 10 mg daily; near LDL goal <100 ?- check lipids, decrease fatty foods, increase activity.  ?- Lipid panel ?- TSH ? ?Other abnormal glucose ?Recent A1Cs at goal ?Discussed diet/exercise, weight management  ?Defer A1C; check CMP  ? ?Medication management ?- Magnesium ? ?Vitamin D deficiency ?supplementation for goal of 60-100 ?Defer vitamin D level ? ?Overweight - BMI 29 ?Long discussion about weight loss, diet, and exercise ?Recommended diet heavy in fruits and veggies and low in animal meats, cheeses, and dairy products, appropriate calorie intake ?Patient will work on portions, add exercise - try stationary cycle if tolerated  ?Discussed appropriate weight for height and initial goal <170 lb  ?Follow up at next visit ? ?Anxiety ?Recently controlled; on low dose effexor for hot flashes ?Rare xanax use ?Stress management techniques discussed, increase water, good sleep hygiene discussed, increase exercise, and increase veggies.  ? ?R medial epicondylytis/ R finger pain ?Start prednisone taper then meloxicam 2-4 weeks ?Exercises given for elbow; use band and avoid straining/lifting short term until improved; follow up if not resolving and can try injection ?Then voltaren if needed for chronic finger ? ? ?Discussed med's effects and SE's. Screening labs and tests as requested with regular follow-up as recommended. ?Over 40 minutes of exam, counseling, chart review, and complex, high level critical decision making was performed this visit.  ?Future Appointments  ?Date Time Provider [place]  ?02/05/2022  2:30  PM Dohmeier, Asencion Partridge, MD [facility] None  ?07/10/2022 10:00 AM Liane Comber, NP [facility] None  ? ? ?HPI  ?61 y.o. female  presents for a 6 month follow up on htn, hyperlipidemia, prediabetes, vitamin D def, weight.  ? ?S/p achilles tendon repair after rupture on R by Dr. Milinda Pointer in 09/2020, also calcifications on L, completed PT, still has some discomfort. Has exercises but hasn't been doing.  ? ?She is R handed, reports 1 month of R medial elbow pain, also several months of R 2nd digit MCP joint pain intermittently (worse with using mouse at work).  ? ?She follows with derm annually, Dr. Ledell Peoples office but he retired, seeing new provider, history of [place] to nose  ? ?Her mom had lung cancer and passed away in 12/14/18. Patent was on lexapro for depressive episode with benefit but has since tapered off and reports doing well. Has xanax PRN with rare use.  ? ?She was reporting snoring and non-restorative sleep, had sleep study by Dr. Brett Fairy 11/04/2021 that showed moderate OSA, was recommended CPAP, reports 100% compliance, at least 4 hours (typically 7-8) but notes hasn't seem sleep quality improvement. ? ?BMI is Body mass index is 29.09 kg/m?., she has been working on diet, doing gentle walking due to achilles. Considering omada lifestyle program through insurance.  ?Wt Readings from Last 3 Encounters:  ?01/22/22 183 lb (83 kg)  ?10/01/21 180 lb (81.6 kg)  ?07/10/21 177 lb 3.2 oz (80.4 kg)  ? ?Her blood pressure has been controlled at home (typically 120-130/70s, higher this week/when having more pain), today their BP is BP: 132/80 ?She does not workout. She denies chest pain, shortness of breath, dizziness.  ? ?She  is not on cholesterol medication (pravastatin 20 mg daily caused myalgias, now on zetia 10 mg) and denies myalgias. Her cholesterol is not at goal. Mom had CVA (hemorrhagic). The cholesterol last visit was:   ?Lab Results  ?Component Value Date  ? CHOL 170 07/10/2021  ? HDL 39 (L) 07/10/2021  ? LDLCALC 105 (H)  07/10/2021  ? TRIG 145 07/10/2021  ? CHOLHDL 4.4 07/10/2021  ? ?She has been working on diet and exercise for prediabetes, and denies paresthesia of the feet, polydipsia, polyuria and visual disturbances. Last A1C in the office was:  ?Lab Results  ?Component Value Date  ? HGBA1C 5.6 07/10/2021  ? ?Patient is on Vitamin D supplement, increased to taking 2000 IU last visit    ?Lab Results  ?Component Value Date  ? VD25OH 48 07/10/2021  ?   ? ? ?Current Medications:  ?Current Outpatient Medications on File Prior to Visit  ?Medication Sig Dispense Refill  ? ALPRAZolam (XANAX) 0.5 MG tablet TAKE 1 TABLET BY MOUTH 2 TIMES DAILY AS NEEDED 60 tablet 0  ? aspirin 81 MG EC tablet Take 81 mg by mouth daily.    ? cholecalciferol (VITAMIN D) 1000 UNITS tablet Take 1,000 Units by mouth daily.    ? ezetimibe (ZETIA) 10 MG tablet TAKE 1 TABLET DAILY 90 tablet 3  ? famotidine (PEPCID) 10 MG tablet Take 10 mg by mouth 2 (two) times daily as needed.    ? fluocinonide cream (LIDEX) 6.19 % Apply 1 application topically 2 (two) times daily.    ? fluticasone (FLONASE) 50 MCG/ACT nasal spray Place 2 sprays into both nostrils at bedtime. 16 g 1  ? ketoconazole (NIZORAL) 2 % cream Apply 1 application topically daily.    ? meloxicam (MOBIC) 15 MG tablet Take 15 mg by mouth daily.    ? Multiple Vitamin (MULTIVITAMIN) tablet Take 1 tablet by mouth daily.    ? olmesartan (BENICAR) 40 MG tablet TAKE 1 TABLET DAILY FOR BLOOD PRESSURE 90 tablet 3  ? Omega-3 Fatty Acids (FISH OIL) 1200 MG CAPS Take 1 capsule by mouth daily.    ? valACYclovir (VALTREX) 500 MG tablet Take     1 tablet     Daily  -  prophylaxis for Cold Sores / Fever Blisters (Patient taking differently: as needed. Take     1 tablet     Daily  -  prophylaxis for Cold Sores / Fever Blisters) 90 tablet 0  ? azithromycin (ZITHROMAX Z-PAK) 250 MG tablet As directed (Patient not taking: Reported on 01/22/2022) 6 tablet 0  ? promethazine-dextromethorphan (PROMETHAZINE-DM) 6.25-15 MG/5ML syrup  TAKE 1 TEASPOONFUL (5 mls) BY MOUTH 4 TIMES DAILY AS NEEDED FOR COUGH (Patient not taking: Reported on 01/22/2022) 240 mL 1  ? ?No current facility-administered medications on file prior to visit.  ? ?Medical History:  ?Past Medical History:  ?Diagnosis Date  ? Achilles tendinitis   ? Acid reflux 07/09/2020  ? Anxiety   ? Basal cell carcinoma of nose 05/23/2015  ? Endometrial hyperplasia without atypia, simple 09/10/2012  ? Endometrial hyperplasia without atypia, simple 09/10/2012  ? HSV-1 infection   ? HTN (hypertension)   ? Hyperlipidemia   ? Migraine   ? PONV (postoperative nausea and vomiting)   ? ?Allergies ?Allergies  ?Allergen Reactions  ? Penicillins   ?  REACTION: hives  ? Pravastatin Other (See Comments)  ?  myalgias  ? ? ?SURGICAL HISTORY ?She  has a past surgical history that includes Tubal ligation (1994); acl  replacement; Cervical disc surgery (2012); Robotic assisted total hysterectomy (09/10/2012); Bilateral salpingectomy (09/10/2012); De Quervian's release (Bilateral, 2019); Colonoscopy (2011); and Achilles tendon surgery (Right, 10/19/2020). ?FAMILY HISTORY ?Her family history includes Alzheimer's disease in her father; Breast cancer in her mother; CVA (age of onset: 15) in her brother; Cancer in her maternal grandfather; Diabetes in her brother; Heart disease in her brother; Hyperlipidemia in her brother; Hypertension in her brother, brother, and mother; Lung cancer in her mother; Stroke in her mother. ?SOCIAL HISTORY ?She  reports that she has never smoked. She has been exposed to tobacco smoke. She has never used smokeless tobacco. She reports current alcohol use. She reports that she does not use drugs. ? ?Review of Systems:  ?Review of Systems  ?Constitutional: Negative.  Negative for malaise/fatigue and weight loss.  ?HENT:  Negative for congestion, ear discharge, ear pain, hearing loss, nosebleeds, sore throat and tinnitus.   ?Eyes: Negative.  Negative for blurred vision and double vision.   ?Respiratory: Negative.  Negative for cough, shortness of breath, wheezing and stridor.   ?Cardiovascular: Negative.  Negative for chest pain, palpitations, orthopnea, claudication and leg swelling.  ?G

## 2022-01-22 NOTE — Patient Instructions (Signed)
Golfer's Elbow ? ?Golfer's elbow (medial epicondylitis) is a condition that results from inflammation of the strong bands of tissue (tendons) that attach your forearm muscles to the inside of your bone at the elbow. These tendons affect the muscles that bend the palm toward the wrist (flexion). The tendons become less flexible with age. ?This condition is called golfer's elbow because it is more common among people who constantly bend and twist their wrists, such as golfers. This injury is usually caused by repeated use of the same muscles. ?What are the causes? ?This condition is caused by: ?Repeatedly flexing, turning, or twisting your wrist. ?Frequently gripping objects with your hands. ?Sudden injury. ?What increases the risk? ?This condition is more likely to develop in people who play golf, baseball, or tennis. This injury is more common among people who have jobs that require the constant use of their hands, such as: ?People who use computers. ?Carpenters. ?Butchers. ?Musicians. ?What are the signs or symptoms? ?This condition causes elbow pain that may spread to your forearm and upper arm. Symptoms of this condition include: ?Pain at the inner elbow, forearm, or wrist. ?A weak grip in the hand. ?The pain may get worse when you bend your wrist downward. ?How is this diagnosed? ?This condition is diagnosed based on your symptoms, your medical history, and a physical exam. During the exam, your health care provider may: ?Test your grip strength. ?Move your wrist to check for pain. ?You may also have an MRI to: ?Confirm the diagnosis. ?Look for other issues. ?Check for tears in the ligaments, muscles, or tendons. ?How is this treated? ?Treatment for this condition includes: ?Stopping all activities that make you bend or twist your elbow or wrist and waiting until your pain and other symptoms go away before resuming those activities. ?Wearing an elbow brace or wrist splint to restrict the movements that cause  symptoms. ?Icing your inner elbow, forearm, or wrist to relieve pain. ?Taking NSAIDs, such as ibuprofen, or getting corticosteroid injections to reduce pain and swelling. ?Doing stretching, range-of-motion, and strengthening exercises (physical therapy) as told by your health care provider. ?In rare cases, surgery may be needed if your condition does not improve. ?Follow these instructions at home: ?If you have a brace or splint: ?Wear the brace or splint as told by your health care provider. Remove it only as told by your health care provider. ?Check the skin around the brace or splint every day. Tell your health care provider about any concerns. ?Loosen the brace or splint if your fingers tingle, become numb, or turn cold and blue. ?Keep it clean. ?If the brace or splint is not waterproof: ?Do not let it get wet. ?Cover it with a watertight covering when you take a bath or shower. ?Managing pain, stiffness, and swelling ? ?If directed, put ice on the injured area. To do this: ?If you have a removable brace or splint, remove it as told by your health care provider. ?Put ice in a plastic bag. ?Place a towel between your skin and the bag. ?Leave the ice on for 20 minutes, 2-3 times a day. ?Remove the ice if your skin turns bright red. This is very important. If you cannot feel pain, heat, or cold, you have a greater risk of damage to the area. ?Move your fingers often to avoid stiffness and swelling. ?Activity ?Rest your injured area as told by your health care provider. ?Return to your normal activities as told by your health care provider. Ask your health care  provider what activities are safe for you. ?Do exercises as told by your health care provider. ?Lifestyle ?If your condition is caused by sports, work with a trainer to make sure that you: ?Use the correct technique. ?Use the proper equipment. ?If your condition is work related, talk with your employer about ways to manage your condition at work. ?General  instructions ?Take over-the-counter and prescription medicines only as told by your health care provider. ?Do not use any products that contain nicotine or tobacco. These products include cigarettes, chewing tobacco, and vaping devices, such as e-cigarettes. If you need help quitting, ask your health care provider. ?Keep all follow-up visits. This is important. ?How is this prevented? ?Before and after activity: ?Warm up and stretch before being active. ?Cool down and stretch after being active. ?Give your body time to rest between periods of activity. ?During activity: ?Make sure to use equipment that fits you. ?If you play golf, slow your golf swing to reduce shock in the arm when making contact with the ball. ?Maintain physical fitness, including: ?Strength. ?Flexibility. ?Endurance. ?Do exercises to strengthen the forearm muscles. ?Contact a health care provider if: ?Your pain does not improve or it gets worse. ?You notice numbness in your hand. ?Get help right away if: ?Your pain is severe. ?You cannot move your wrist. ?Summary ?Golfer's elbow, also called medial epicondylitis, is a condition that results from inflammation of the strong bands of tissue (tendons) that attach your forearm muscles to the inside of your bone at the elbow. ?This injury usually results from overuse. ?Symptoms of this condition include decreased grip strength and pain at the inner elbow, forearm, or wrist. ?This injury is treated with rest, a brace or splint, ice, medicines, physical therapy, and surgery as needed. ?This information is not intended to replace advice given to you by your health care provider. Make sure you discuss any questions you have with your health care provider. ?Document Revised: 03/20/2020 Document Reviewed: 03/20/2020 ?Elsevier Patient Education ? [place]. ? ? ? ?Golfer's Elbow Rehab ?Ask your health care provider which exercises are safe for you. Do exercises exactly as told by your health care  provider and adjust them as directed. It is normal to feel mild stretching, pulling, tightness, or discomfort as you do these exercises. Stop right away if you feel sudden pain or your pain gets worse. Do not begin these exercises until told by your health care provider. ?Stretching and range-of-motion exercises ?These exercises warm up your muscles and joints and improve the movement and flexibility of your elbow. ?Wrist extension, assisted ? ?Straighten your left / right elbow in front of you with your palm facing up toward the ceiling. ?If told by your health care provider, bend your left / right elbow to a 90-degree angle (right angle) at your side instead of holding it straight. ?With your other hand, gently pull your left / right hand and fingers toward the floor (extension). Stop when you feel a gentle stretch on the palm side of your forearm. ?Hold this position for __________ seconds. ?Repeat __________ times. Complete this exercise __________ times a day. ?Wrist flexion, assisted ? ?Straighten your left / right elbow in front of you with your palm facing down toward the floor. ?If told by your health care provider, bend your left / right elbow to a 90-degree angle (right angle) at your side instead of holding it straight. ?With your other hand, gently push over the back of your left / right hand so your  fingers point toward the floor (flexion). Stop when you feel a gentle stretch on the back of your forearm. ?Hold this position for __________ seconds. ?Repeat __________ times. Complete this exercise __________ times a day. ?Assisted forearm rotation, supination ?Sit or stand with your elbows at your side. ?Bend your left / right elbow to a 90-degree angle (right angle). ?Using your uninjured hand, turn your left / right palm up toward the ceiling (supination) until you feel a gentle stretch along the inside of your forearm. ?Hold this position for __________ seconds. ?Repeat __________ times. Complete this  exercise __________ times a day. ?Assisted forearm rotation, pronation ?Sit or stand with your elbows at your side. ?Bend your left / right elbow to a 90-degree angle (right angle). ?Using your uninjured hand,

## 2022-01-23 LAB — LIPID PANEL
Cholesterol: 186 mg/dL (ref <200)
HDL: 44 mg/dL — ABNORMAL LOW (ref >=50)
LDL Cholesterol (Calc): 114 mg/dL (calc) — ABNORMAL HIGH
Non-HDL Cholesterol (Calc): 142 mg/dL (calc) — ABNORMAL HIGH (ref <130)
Total CHOL/HDL Ratio: 4.2 (calc) (ref <5.0)
Triglycerides: 166 mg/dL — ABNORMAL HIGH (ref <150)

## 2022-01-23 LAB — CBC WITH DIFFERENTIAL/PLATELET
Absolute Monocytes: 443 cells/uL (ref 200–950)
Basophils Absolute: 53 cells/uL (ref 0–200)
Basophils Relative: 0.7 %
Eosinophils Absolute: 173 cells/uL (ref 15–500)
Eosinophils Relative: 2.3 %
HCT: 40.4 % (ref 35.0–45.0)
Hemoglobin: 13.5 g/dL (ref 11.7–15.5)
Lymphs Abs: 1890 cells/uL (ref 850–3900)
MCH: 29.7 pg (ref 27.0–33.0)
MCHC: 33.4 g/dL (ref 32.0–36.0)
MCV: 88.8 fL (ref 80.0–100.0)
MPV: 11.4 fL (ref 7.5–12.5)
Monocytes Relative: 5.9 %
Neutro Abs: 4943 cells/uL (ref 1500–7800)
Neutrophils Relative %: 65.9 %
Platelets: 291 10*3/uL (ref 140–400)
RBC: 4.55 10*6/uL (ref 3.80–5.10)
RDW: 12.5 % (ref 11.0–15.0)
Total Lymphocyte: 25.2 %
WBC: 7.5 10*3/uL (ref 3.8–10.8)

## 2022-01-23 LAB — COMPLETE METABOLIC PANEL WITH GFR
AG Ratio: 1.7 (calc) (ref 1.0–2.5)
ALT: 29 U/L (ref 6–29)
AST: 23 U/L (ref 10–35)
Albumin: 4.5 g/dL (ref 3.6–5.1)
Alkaline phosphatase (APISO): 112 U/L (ref 37–153)
BUN: 18 mg/dL (ref 7–25)
CO2: 28 mmol/L (ref 20–32)
Calcium: 10 mg/dL (ref 8.6–10.4)
Chloride: 103 mmol/L (ref 98–110)
Creat: 0.75 mg/dL (ref 0.50–1.05)
Globulin: 2.7 g/dL (calc) (ref 1.9–3.7)
Glucose, Bld: 89 mg/dL (ref 65–99)
Potassium: 4.3 mmol/L (ref 3.5–5.3)
Sodium: 141 mmol/L (ref 135–146)
Total Bilirubin: 0.4 mg/dL (ref 0.2–1.2)
Total Protein: 7.2 g/dL (ref 6.1–8.1)
eGFR: 90 mL/min/{1.73_m2} (ref >=60)

## 2022-01-23 LAB — MAGNESIUM: Magnesium: 2.4 mg/dL (ref 1.5–2.5)

## 2022-01-23 LAB — TSH: TSH: 2.45 mIU/L (ref 0.40–4.50)

## 2022-01-25 DIAGNOSIS — G4733 Obstructive sleep apnea (adult) (pediatric): Secondary | ICD-10-CM | POA: Diagnosis not present

## 2022-02-05 ENCOUNTER — Ambulatory Visit (INDEPENDENT_AMBULATORY_CARE_PROVIDER_SITE_OTHER): Payer: BC Managed Care – PPO | Admitting: Family Medicine

## 2022-02-05 ENCOUNTER — Encounter: Payer: Self-pay | Admitting: Family Medicine

## 2022-02-05 ENCOUNTER — Ambulatory Visit: Payer: BC Managed Care – PPO | Admitting: Neurology

## 2022-02-05 VITALS — BP 155/102 | HR 75 | Ht 66.5 in | Wt 180.5 lb

## 2022-02-05 DIAGNOSIS — Z9989 Dependence on other enabling machines and devices: Secondary | ICD-10-CM

## 2022-02-05 DIAGNOSIS — G4733 Obstructive sleep apnea (adult) (pediatric): Secondary | ICD-10-CM | POA: Diagnosis not present

## 2022-02-05 NOTE — Progress Notes (Signed)
? ? ?PATIENT: Natasha Fritz ?DOB: [birth year] ? ?REASON FOR VISIT: follow up ?HISTORY FROM: patient ? ?Chief Complaint  ?Patient presents with  ? Obstructive Sleep Apnea  ?  Rm 1, alone. Here for initial CPAP f/u. Pt reports doing well. Still getting used to wearing it.   ?  ? ?HISTORY OF PRESENT ILLNESS: ? ?02/05/22 ALL:  ?Natasha Fritz is a 61 y.o. female here today for follow up for OSA on CPAP. She was seen in consult with Dr Brett Fairy 09/2021 for snoring and fatigue. HST 11/06/2021 showed moderately severe OSA with total AHI 28.8/hr, REM AHI 32.1/hr an no significant hypoxia. AutoPAP ordered. Since, she is doing fairly well. She does not love using CPAP but does note that she wakes up less often at night. Her husband has told her that she no longer snores. She has noticed a leak with small nasal pillow but reports that she has not changed mask since starting therapy.  ? ? ? ?HISTORY: (copied from Dr Dohmeier's previous note) ? ?Natasha Fritz is a 61 y.o.  patient who is seen here upon referral on 10/01/2021 from Dr. Melford Aase for a Sleep consultation. [name]  ?Chief concern according to patient :  Internal referral for snoring and fatigue. Here to r/o OSA. No prior SS. Pt noticed snoring about 3 years ago. Works from home.  ?  ? Natasha Fritz  has a past medical history of Achilles tendinitis, Acid reflux (07/09/2020), Anxiety, Basal cell carcinoma of nose (05/23/2015), Endometrial hyperplasia without atypia, simple (09/10/2012), Endometrial hyperplasia without atypia, simple (09/10/2012), HSV-1 infection, HTN (hypertension), Hyperlipidemia, Migraine, and PONV (postoperative nausea and vomiting). Achilles tendon surgery 09-2020.  ?  ?Sleep relevant medical history: snoring for about 3 years ( weight gain  pandemic) since mother's death in 12/25/2018.   ?Nocturia 1-3, no Tonsillectomy no other ENT surgery, cervical spine surgery anterior fusion. ?  ?Family medical /sleep history: Father with OSA..  ?  ?Social history:  Patient is  working as Engineer, technical sales for [employer] and lives in a household with spouse, adult 3 children-  one dog.  ?The patient currently works from home , often at night, too. 10 hour shifts, on call duties.  ?Tobacco use; never .  ETOH use ; social ,  ?Caffeine intake in form of Coffee( /) Soda( /) Tea ( 2-3 cups in Am and hot tea) ,nor energy drinks. ?Regular exercise in form of walking the dog.   ?  ?Sleep habits are as follows: The patient's dinner time is between 6-7 PM. The patient goes to bed at 8-12 PM and continues to sleep for 2-4 hours, wakes for 1-3 bathroom breaks.   ?The preferred sleep position is on her sides, with the support of 1-2 pillows. Snores in supine and on her side.  ?Dreams are reportedly infrequent..  ?6  AM is the usual rise time. The patient wakes up with an alarm.  ?She reports not feeling refreshed or restored in AM, with symptoms such as dry mouth, morning headaches, and residual fatigue.  ?Naps are taken infrequently, lasting from 60 to 120 minutes and are not refreshing.  ? ? ?REVIEW OF SYSTEMS: Out of a complete 14 system review of symptoms, the patient complains only of the following symptoms, fatigue and all other reviewed systems are negative. ? ?ESS: 10/24 ? ?ALLERGIES: ?Allergies  ?Allergen Reactions  ? Penicillins   ?  REACTION: hives  ? Pravastatin Other (See Comments)  ?  myalgias  ? ? ?  HOME MEDICATIONS: ?Outpatient Medications Prior to Visit  ?Medication Sig Dispense Refill  ? ALPRAZolam (XANAX) 0.5 MG tablet TAKE 1 TABLET BY MOUTH 2 TIMES DAILY AS NEEDED 60 tablet 0  ? aspirin 81 MG EC tablet Take 81 mg by mouth daily.    ? cholecalciferol (VITAMIN D) 1000 UNITS tablet Take 1,000 Units by mouth daily.    ? ezetimibe (ZETIA) 10 MG tablet TAKE 1 TABLET DAILY 90 tablet 3  ? famotidine (PEPCID) 10 MG tablet Take 10 mg by mouth 2 (two) times daily as needed.    ? fluocinonide cream (LIDEX) 7.86 % Apply 1 application topically 2 (two) times daily.    ? fluticasone (FLONASE) 50 MCG/ACT nasal  spray Place 2 sprays into both nostrils at bedtime. 16 g 1  ? ketoconazole (NIZORAL) 2 % cream Apply 1 application topically daily.    ? meloxicam (MOBIC) 15 MG tablet Take 1 tab daily with food for 2-4 weeks then daily as needed. Do not take with ibuprofen or aleve. 30 tablet 1  ? Multiple Vitamin (MULTIVITAMIN) tablet Take 1 tablet by mouth daily.    ? olmesartan (BENICAR) 40 MG tablet TAKE 1 TABLET DAILY FOR BLOOD PRESSURE 90 tablet 3  ? Omega-3 Fatty Acids (FISH OIL) 1200 MG CAPS Take 1 capsule by mouth daily.    ? predniSONE (DELTASONE) 20 MG tablet 2 tablets daily for 3 days, 1 tablet daily for 4 days. Take with food, do not take with ibuprofen or aleve. 10 tablet 0  ? valACYclovir (VALTREX) 500 MG tablet Take     1 tablet     Daily  -  prophylaxis for Cold Sores / Fever Blisters (Patient taking differently: as needed. Take     1 tablet     Daily  -  prophylaxis for Cold Sores / Fever Blisters) 90 tablet 0  ? venlafaxine XR (EFFEXOR-XR) 37.5 MG 24 hr capsule Take 37.5 mg by mouth daily. For hot flashes.    ? ?No facility-administered medications prior to visit.  ? ? ?PAST MEDICAL HISTORY: ?Past Medical History:  ?Diagnosis Date  ? Achilles tendinitis   ? Acid reflux 07/09/2020  ? Anxiety   ? Basal cell carcinoma of nose 05/23/2015  ? Endometrial hyperplasia without atypia, simple 09/10/2012  ? Endometrial hyperplasia without atypia, simple 09/10/2012  ? HSV-1 infection   ? HTN (hypertension)   ? Hyperlipidemia   ? Migraine   ? PONV (postoperative nausea and vomiting)   ? ? ?PAST SURGICAL HISTORY: ?Past Surgical History:  ?Procedure Laterality Date  ? ACHILLES TENDON SURGERY Right 10/19/2020  ? Dr. Milinda Pointer  ? acl replacement    ? BILATERAL SALPINGECTOMY  09/10/2012  ? Procedure: BILATERAL SALPINGECTOMY;  Surgeon: Elveria Royals, MD;  Location: [facility];  Service: Gynecology;  Laterality: Bilateral;  ? [facility] [place] [facility]GERY  2012  ? fusion, Dr. Joya Salm  ? COLONOSCOPY  2011  ? De Quervian's release Bilateral 2019  ?  Dr. Burney Gauze   ? ROBOTIC ASSISTED TOTAL HYSTERECTOMY  09/10/2012  ? Procedure: ROBOTIC ASSISTED TOTAL HYSTERECTOMY;  Surgeon: Elveria Royals, MD;  Location: [facility];  Service: Gynecology;  Laterality: N/A;  3 hrs.  ? TUBAL LIGATION  1994  ? ? ?FAMILY HISTORY: ?Family History  ?Problem Relation Age of Onset  ? Hypertension Mother   ? Stroke Mother   ? Breast cancer Mother   ? Lung cancer Mother   ? Alzheimer's disease Father   ? Hyperlipidemia Brother   ? Heart disease  Brother   ? Diabetes Brother   ? Hypertension Brother   ? Hypertension Brother   ? CVA Brother 59  ? Cancer Maternal Grandfather   ? Esophageal cancer Neg Hx   ? Colon cancer Neg Hx   ? Colon polyps Neg Hx   ? Stomach cancer Neg Hx   ? Rectal cancer Neg Hx   ? ? ?SOCIAL HISTORY: ?Social History  ? ?Socioeconomic History  ? Marital status: Married  ?  Spouse name: Herbie Baltimore  ? Number of children: 3  ? Years of education: Not on file  ? Highest education level: Bachelor's degree (e.g., BA, AB, BS)  ?Occupational History  ? Not on file  ?Tobacco Use  ? Smoking status: Never  ?  Passive exposure: Yes  ? Smokeless tobacco: Never  ? Tobacco comments:  ?  tobacco use - no   ?Vaping Use  ? Vaping Use: Never used  ?Substance and Sexual Activity  ? Alcohol use: Yes  ?  Alcohol/week: 0.0 standard drinks  ?  Comment: rare  ? Drug use: No  ? Sexual activity: Not Currently  ?  Partners: Male  ?  Birth control/protection: Post-menopausal, Surgical  ?Other Topics Concern  ? Not on file  ?Social History Narrative  ? Married   ? Lives at home with husband  ? Right handed  ? Caffeine: 3 cups of tea with same tea bag.  ?  full time, gets regular exercise.   ? ?Social Determinants of Health  ? ?Financial Resource Strain: Not on file  ?Food Insecurity: Not on file  ?Transportation Needs: Not on file  ?Physical Activity: Not on file  ?Stress: Not on file  ?Social Connections: Not on file  ?Intimate Partner Violence: Not on file  ? ? ? ?PHYSICAL EXAM ? ?Vitals:  ? 02/05/22 1308   ?BP: (!) 155/102  ?Pulse: 75  ?Weight: 180 lb 8 oz (81.9 kg)  ?Height: 5' 6.5" (1.689 m)  ? ?Body mass index is 28.7 kg/m?. ? ?Generalized: Well developed, in no acute distress  ?Cardiology: normal r

## 2022-02-05 NOTE — Patient Instructions (Addendum)

## 2022-02-25 DIAGNOSIS — G4733 Obstructive sleep apnea (adult) (pediatric): Secondary | ICD-10-CM | POA: Diagnosis not present

## 2022-02-28 ENCOUNTER — Other Ambulatory Visit (HOSPITAL_COMMUNITY): Payer: Self-pay

## 2022-03-27 DIAGNOSIS — G4733 Obstructive sleep apnea (adult) (pediatric): Secondary | ICD-10-CM | POA: Diagnosis not present

## 2022-04-21 ENCOUNTER — Other Ambulatory Visit: Payer: Self-pay | Admitting: Internal Medicine

## 2022-04-21 DIAGNOSIS — D485 Neoplasm of uncertain behavior of skin: Secondary | ICD-10-CM | POA: Diagnosis not present

## 2022-04-21 DIAGNOSIS — L439 Lichen planus, unspecified: Secondary | ICD-10-CM | POA: Diagnosis not present

## 2022-04-21 DIAGNOSIS — L538 Other specified erythematous conditions: Secondary | ICD-10-CM | POA: Diagnosis not present

## 2022-04-27 DIAGNOSIS — G4733 Obstructive sleep apnea (adult) (pediatric): Secondary | ICD-10-CM | POA: Diagnosis not present

## 2022-05-28 DIAGNOSIS — G4733 Obstructive sleep apnea (adult) (pediatric): Secondary | ICD-10-CM | POA: Diagnosis not present

## 2022-05-30 ENCOUNTER — Other Ambulatory Visit (HOSPITAL_COMMUNITY): Payer: Self-pay

## 2022-06-02 ENCOUNTER — Other Ambulatory Visit: Payer: Self-pay | Admitting: Obstetrics & Gynecology

## 2022-06-02 DIAGNOSIS — Z1231 Encounter for screening mammogram for malignant neoplasm of breast: Secondary | ICD-10-CM

## 2022-06-24 ENCOUNTER — Ambulatory Visit: Payer: BC Managed Care – PPO

## 2022-06-27 DIAGNOSIS — G4733 Obstructive sleep apnea (adult) (pediatric): Secondary | ICD-10-CM | POA: Diagnosis not present

## 2022-07-01 DIAGNOSIS — J0100 Acute maxillary sinusitis, unspecified: Secondary | ICD-10-CM | POA: Diagnosis not present

## 2022-07-07 DIAGNOSIS — J0100 Acute maxillary sinusitis, unspecified: Secondary | ICD-10-CM | POA: Diagnosis not present

## 2022-07-10 ENCOUNTER — Encounter: Payer: BC Managed Care – PPO | Admitting: Nurse Practitioner

## 2022-07-11 ENCOUNTER — Ambulatory Visit
Admission: RE | Admit: 2022-07-11 | Discharge: 2022-07-11 | Disposition: A | Discharge status: Home / Self Care | Payer: BC Managed Care – PPO | Source: Ambulatory Visit | Attending: Obstetrics & Gynecology | Admitting: Obstetrics & Gynecology

## 2022-07-11 DIAGNOSIS — Z1231 Encounter for screening mammogram for malignant neoplasm of breast: Secondary | ICD-10-CM

## 2022-07-15 NOTE — Progress Notes (Signed)
Complete Physical  Assessment and Plan:  Encounter for general adult medical examination with abnormal findings Due annually  Essential hypertension Discussed DASH (Dietary Approaches to Stop Hypertension) DASH diet is lower in sodium than a typical American diet. Cut back on foods that are high in saturated fat, cholesterol, and trans fats. Eat more whole-grain foods, fish, poultry, and nuts Remain active and exercise as tolerated daily.  Monitor BP at home-Call if greater than 130/80.  Check and monitor CMP/CBC   Hyperlipidemia Discussed lifestyle modifications. Recommended diet heavy in fruits and veggies, omega 3's. Decrease consumption of animal meats, cheeses, and dairy products. Remain active and exercise as tolerated. Continue to monitor. Check and monitor lipids/TSH  Prediabetes Education: Reviewed 'ABCs' of diabetes management  Discussed goals to be met and/or maintained include A1C (<7) Blood pressure (<130/80) Cholesterol (LDL <70) Continue Eye Exam yearly  Continue Dental Exam Q6 mo Discussed dietary recommendations Discussed Physical Activity recommendations Check and monitor A1C  Medication management All medications discussed and reviewed in full. All questions and concerns regarding medications addressed.    Vitamin D deficiency Continue supplement Monitor levels  DDD (degenerative disc disease), cervical Controlled Meloxicam PRN Continue to monitor   Hx of basal cell carcinoma of nose Derm following annually  Apt 07/2022  Other migraine without status migrainosus, not intractable Resolved  Obesity - BMI 30  Discussed appropriate BMI Goal of losing 1 lb per month. Diet modification. Physical activity. Encouraged/praised to build confidence.   OSA OSA on CPAP  Follows Neurology - last seen 01/2022 Consulted 09/2021 for snoring and fatigue.   HST 11/06/2021 showed moderately severel OSA. Denies other concerning sx of  OSA  Anxiety Continue Effexor - also uses for hot flashes Reviewed relaxation techniques.  Sleep hygiene. Recommended mindfulness meditation and exercise.   Psychoeducation:  encouraged personality growth wand development through coping techniques and problem-solving skills. Limit/Decrease/Monitor drug/alcohol intake.     Bilateral achilles tendonitis Currently controlled. Continue to monitor  Screening for thyroid disorder Check and monitor TSH  Screening for proteinuria/hematuria Check and monitor UA  B12 Deficiency Check and monitor levels  Tick Bite Check alpha gal  Screening for heart disease Monitor EKG  Other Chest Pain Chest X-ray to be completed Report to ER for any difficulty breathing  Sinusitis Suggested daily antihistamine Stay well hydrated Possible referral to Asthma/Allergy specialist if s/s fail to improve.  Orders Placed This Encounter  Procedures   DG Chest 2 View    Standing Status:   Future    Number of Occurrences:   1    Standing Expiration Date:   07/17/2023    Order Specific Question:   Reason for Exam (SYMPTOM  OR DIAGNOSIS REQUIRED)    Answer:   right mid anterior lower chest pain along 10th and 11th rib, pain with palpation    Order Specific Question:   Preferred imaging location?    Answer:   [facility]   CBC with Differential/Platelet   COMPLETE METABOLIC PANEL WITH GFR   Magnesium   Lipid panel   TSH   Hemoglobin A1c   Insulin, random   VITAMIN D 25 Hydroxy (Vit-D Deficiency, Fractures)   Urinalysis, Routine w reflex microscopic   Microalbumin / creatinine urine ratio   Vitamin B12   Alpha-Gal Panel   EKG 12-Lead    Notify office for further evaluation and treatment, questions or concerns if any reported s/s fail to improve.   The patient was advised to call back or seek an in-person  evaluation if any symptoms worsen or if the condition fails to improve as anticipated.   Further disposition pending results of  labs. Discussed med's effects and SE's.    I discussed the assessment and treatment plan with the patient. The patient was provided an opportunity to ask questions and all were answered. The patient agreed with the plan and demonstrated an understanding of the instructions.  Discussed med's effects and SE's. Screening labs and tests as requested with regular follow-up as recommended.  I provided 35 minutes of face-to-face time during this encounter including counseling, chart review, and critical decision making was preformed.  Future Appointments  Date Time Provider Department Center  01/14/2023 10:30 AM Adela Glimpse, NP [facility] None  02/11/2023  1:00 PM Shawnie Dapper, NP [facility] None  07/20/2023  9:00 AM Ramey Ketcherside, Archie Patten, NP [facility] None    HPI  61 y.o. female  presents for a complete physical. She has Hyperlipidemia; Essential hypertension; Other abnormal glucose; Medication management; Vitamin D deficiency; DDD (degenerative disc disease), cervical; Overweight (BMI 25.0-29.9); Anxiety; History of basal cell carcinoma (BCC); History of adenomatous polyp of colon; Achilles tendinitis of both lower extremities; Acid reflux; Major depressive disorder, single episode, in remission (HCC); AV block, 1st degree; Retrognathia; Moderate obstructive sleep apnea-hypopnea syndrome; and Medial epicondylitis of right elbow on their problem list.  She is married, 3 grown kids, 2 daughters and a son, 3 grandchildren in [place].  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  Her mom had lung cancer and passed away in 2018-09-29. She is still trying to handle her mother's estate.  Mood has become more stable.  She is no longer taking Lexapro. She is continuing Effexor for hot flashes.    She is concerned for persistent sickness, including sinusitis, cold type symptoms that have occurred several times since the spring season.  She has tried antihistamines that have not worked.  She feels as though it could be related to a meat  allergy.  Reports having [employer] and the next day breaking out into a sinus infection and feeling poorly.  She does note being bit by a tick several months ago on the right upper back.  The area is still raised and red.  She saved the tick but then threw it out recently.   She also notes pain in the right lower chest area.  More present when lying down and during palpation.  Has been present for the last several months.  Has not noticed any radiating pain.  No changes to BM, constipation or diarrhea.  Denies blood in stool. Denies any recent fall, trauma, injury.   Follows Dr. Juliene Pina at [facility] annually. Hx of hysterectomy sparing ovaries.   She follows with derm annually, due to history of BCC to nose.  Had a recent ares on the chest that was removed - no concern for malignancy.  She has hx of snoring, obesity.  Saw [facility] 09/29/21 for HST which revealed moderate OSA.  Has CPAP.  She continues to use nightly and reports feeling rested.  BMI is Body mass index is 28.31 kg/m., she has been working on diet and exercise.  Has completed PT for achilles tendonitis.She has lost 5 lb since the last visit. Wt Readings from Last 3 Encounters:  07/16/22 175 lb 6.4 oz (79.6 kg)  02/05/22 180 lb 8 oz (81.9 kg)  01/22/22 183 lb (83 kg)   Today their BP is BP: 120/80 and well controlled.  She denies chest pain, shortness of breath,  dizziness.   She is not on cholesterol medication and denies myalgias. Her cholesterol is not at goal. The cholesterol last visit was:   Lab Results  Component Value Date   CHOL 186 01/22/2022   HDL 44 (L) 01/22/2022   LDLCALC 114 (H) 01/22/2022   TRIG 166 (H) 01/22/2022   CHOLHDL 4.2 01/22/2022   She has been working on diet and exercise for prediabetes, and denies paresthesia of the feet, polydipsia, polyuria and visual disturbances. Last A1C in the office was:  Lab Results  Component Value Date   HGBA1C 5.6 07/10/2021   Patient is on Vitamin D  supplement.   Lab Results  Component Value Date   VD25OH 48 07/10/2021       Current Medications:  Current Outpatient Medications on File Prior to Visit  Medication Sig Dispense Refill   ALPRAZolam (XANAX) 0.5 MG tablet TAKE 1 TABLET BY MOUTH 2 TIMES DAILY AS NEEDED 60 tablet 0   aspirin 81 MG EC tablet Take 81 mg by mouth daily.     CHELATED MAGNESIUM PO Take by mouth daily.     cholecalciferol (VITAMIN D) 1000 UNITS tablet Take 1,000 Units by mouth daily.     ezetimibe (ZETIA) 10 MG tablet TAKE 1 TABLET DAILY 90 tablet 3   fluocinonide cream (LIDEX) 0.05 % Apply 1 application topically 2 (two) times daily.     fluticasone (FLONASE) 50 MCG/ACT nasal spray Place 2 sprays into both nostrils at bedtime. 16 g 1   ketoconazole (NIZORAL) 2 % cream Apply 1 application topically daily.     meloxicam (MOBIC) 15 MG tablet Take 1 tab daily with food for 2-4 weeks then daily as needed. Do not take with ibuprofen or aleve. 30 tablet 1   Multiple Vitamin (MULTIVITAMIN) tablet Take 1 tablet by mouth daily.     olmesartan (BENICAR) 40 MG tablet TAKE 1 TABLET DAILY FOR BLOOD PRESSURE 90 tablet 3   Omega-3 Fatty Acids (FISH OIL) 1200 MG CAPS Take 1 capsule by mouth daily.     valACYclovir (VALTREX) 500 MG tablet Take     1 tablet     Daily  -  prophylaxis for Cold Sores / Fever Blisters (Patient taking differently: as needed. Take     1 tablet     Daily  -  prophylaxis for Cold Sores / Fever Blisters) 90 tablet 0   venlafaxine XR (EFFEXOR-XR) 37.5 MG 24 hr capsule Take 37.5 mg by mouth daily. For hot flashes.     famotidine (PEPCID) 10 MG tablet Take 10 mg by mouth 2 (two) times daily as needed. (Patient not taking: Reported on 07/16/2022)     predniSONE (DELTASONE) 20 MG tablet 2 tablets daily for 3 days, 1 tablet daily for 4 days. Take with food, do not take with ibuprofen or aleve. 10 tablet 0   No current facility-administered medications on file prior to visit.   Health Maintenance:   Immunization  History  Administered Date(s) Administered   Influenza Inj Mdck Quad Pf 07/02/2021   Influenza Inj Mdck Quad With Preservative 07/09/2020   Influenza-Unspecified 06/22/2013, 06/22/2017, 07/12/2018, 07/04/2019, 06/17/2022   PFIZER(Purple Top)SARS-COV-2 Vaccination 10/12/2019, 11/02/2019, 06/26/2020, 06/04/2021   Pfizer Covid-19 Vaccine Bivalent Booster 44yrs & up 03/28/2022, 06/24/2022   Tdap 09/22/2006, 08/10/2017   Zoster Recombinat (Shingrix) 06/22/2017, 11/04/2017   Tetanus: 2018 Pneumovax: N/A Prevnar 13: N/A Flu vaccine: 06/2022 Pharmacy Shingrix: 2/2 Covid 19: 3/3, 2021, pfizer, boosted 06/2022 Pharmacy  LMP: 2013, TAH Pap: April 2019 Dr.  Juliene Pina, going annually  MGM: 06/2022 Negative 1 year DEXA: N/A  Colonoscopy: 04/2020, [place], adenomatous polyp, due 3 years EGD: N/A  Last Dental Exam: Dr. Ethelle Lyon,  2023 q6 mo [facility]  last 2023 Last Eye Exam: Dr. Caryn Section, wears glasses, annually, last 2023  Patient Care Team: Lucky Cowboy, MD as PCP - General (Internal Medicine) Salvatore Marvel, MD as Consulting Physician (Orthopedic Surgery) Mateo Flow, MD as Consulting Physician (Ophthalmology) Pricilla Riffle, MD as Consulting Physician (Cardiology) Louis Meckel, MD (Inactive) as Consulting Physician (Gastroenterology) Shea Evans, MD as Consulting Physician (Obstetrics and Gynecology) Hilda Lias, MD as Consulting Physician (Neurosurgery) Cherlyn Roberts, MD as Consulting Physician (Dermatology)  Medical History:  Past Medical History:  Diagnosis Date   Achilles tendinitis    Acid reflux 07/09/2020   Anxiety    Basal cell carcinoma of nose 05/23/2015   Endometrial hyperplasia without atypia, simple 09/10/2012   Endometrial hyperplasia without atypia, simple 09/10/2012   HSV-1 infection    HTN (hypertension)    Hyperlipidemia    Migraine    PONV (postoperative nausea and vomiting)    Allergies Allergies  Allergen Reactions   Penicillins      REACTION: hives   Pravastatin Other (See Comments)    myalgias    SURGICAL HISTORY She  has a past surgical history that includes Tubal ligation (1994); acl replacement; Cervical disc surgery (2012); Robotic assisted total hysterectomy (09/10/2012); Bilateral salpingectomy (09/10/2012); De Quervian's release (Bilateral, 2019); Colonoscopy (2011); and Achilles tendon surgery (Right, 10/19/2020). FAMILY HISTORY Her family history includes Alzheimer's disease in her father; Breast cancer in her mother; CVA (age of onset: 34) in her brother; Cancer in her maternal grandfather; Diabetes in her brother; Heart disease in her brother; Hyperlipidemia in her brother; Hypertension in her brother, brother, and mother; Lung cancer in her mother; Stroke in her mother. SOCIAL HISTORY She  reports that she has never smoked. She has been exposed to tobacco smoke. She has never used smokeless tobacco. She reports current alcohol use. She reports that she does not use drugs.  Review of Systems:  Review of Systems  Constitutional: Negative.  Negative for malaise/fatigue and weight loss.  HENT:  Negative for congestion, ear discharge, ear pain, hearing loss, nosebleeds, sore throat and tinnitus.   Eyes: Negative.  Negative for blurred vision and double vision.  Respiratory: Negative.  Negative for cough, shortness of breath, wheezing and stridor.   Cardiovascular: Negative.  Negative for chest pain, palpitations, orthopnea, claudication and leg swelling.  Gastrointestinal: Negative.  Negative for abdominal pain, blood in stool, constipation, diarrhea, heartburn, melena, nausea and vomiting.  Genitourinary: Negative.   Musculoskeletal:  Negative for back pain, falls, joint pain, myalgias and neck pain.  Skin: Negative.  Negative for rash.  Neurological:  Negative for dizziness, tingling, tremors, sensory change, speech change, focal weakness, seizures, loss of consciousness, weakness and headaches.   Endo/Heme/Allergies: Negative.  Negative for polydipsia.  Psychiatric/Behavioral:  Negative for depression, hallucinations, memory loss, substance abuse and suicidal ideas. The patient is not nervous/anxious and does not have insomnia.   All other systems reviewed and are negative.   Physical Exam: Estimated body mass index is 28.31 kg/m as calculated from the following:   Height as of this encounter: 5\' 6"  (1.676 m).   Weight as of this encounter: 175 lb 6.4 oz (79.6 kg). BP 120/80   Pulse 77   Temp (!) 96.9 F (36.1 C)   Ht 5\' 6"  (1.676 m)   Wt 175 lb  6.4 oz (79.6 kg)   LMP 09/05/2012   SpO2 98%   BMI 28.31 kg/m   Wt Readings from Last 3 Encounters:  07/16/22 175 lb 6.4 oz (79.6 kg)  02/05/22 180 lb 8 oz (81.9 kg)  01/22/22 183 lb (83 kg)   General Appearance: Well nourished, in no apparent distress.  Eyes: PERRLA, EOMs, conjunctiva no swelling or erythema Sinuses: No Frontal/maxillary tenderness  ENT/Mouth: Ext aud canals clear, normal light reflex with TMs without erythema, bulging. Good dentition. No erythema, swelling, or exudate on post pharynx. Tonsils not swollen or erythematous. Hearing normal.  Neck: Supple, thyroid normal. No bruits  Respiratory: Respiratory effort normal, BS equal bilaterally without rales, rhonchi, wheezing or stridor.  Cardio: RRR without murmurs, rubs or gallops. Brisk peripheral pulses without edema.  Chest: Right lower anterior chest with pain upon palpation along 10th and 11th rib.  Otherwise symmetric, with normal excursions and percussion.  Breasts: defer to GYN Abdomen: Mildly tender to RUQ otherwise soft, no guarding, rebound, hernias, masses, or organomegaly.  Lymphatics: Non tender without lymphadenopathy.  Genitourinary: defer to GYN Musculoskeletal: Full ROM all peripheral extremities,5/5 strength, and mildly antalgic gait. She has bilateral achilles tendon tenderness and mild swelling/boggy without erythema or heat.  Skin: Right  upper posterior back with slightly raised firm mildly erythematous bump. Surrounding skin is warm, dry without rashes, lesions, ecchymosis. Neuro: Cranial nerves intact, reflexes equal bilaterally. Normal muscle tone, no cerebellar symptoms. Sensation intact.  Psych: Awake and oriented X 3, normal affect, Insight and Judgment appropriate.   EKG: NSR  Aryn Kops 1:45 PM [facility]

## 2022-07-16 ENCOUNTER — Ambulatory Visit (INDEPENDENT_AMBULATORY_CARE_PROVIDER_SITE_OTHER): Payer: BC Managed Care – PPO | Admitting: Nurse Practitioner

## 2022-07-16 ENCOUNTER — Encounter: Payer: Self-pay | Admitting: Nurse Practitioner

## 2022-07-16 ENCOUNTER — Ambulatory Visit
Admission: RE | Admit: 2022-07-16 | Discharge: 2022-07-16 | Disposition: A | Discharge status: Home / Self Care | Payer: BC Managed Care – PPO | Source: Ambulatory Visit | Attending: Nurse Practitioner | Admitting: Nurse Practitioner

## 2022-07-16 VITALS — BP 120/80 | HR 77 | Temp 96.9°F | Ht 66.0 in | Wt 175.4 lb

## 2022-07-16 DIAGNOSIS — Z130 Encounter for screening for diseases of the blood and blood-forming organs and certain disorders involving the immune mechanism: Secondary | ICD-10-CM | POA: Diagnosis not present

## 2022-07-16 DIAGNOSIS — Z0000 Encounter for general adult medical examination without abnormal findings: Secondary | ICD-10-CM | POA: Diagnosis not present

## 2022-07-16 DIAGNOSIS — G4733 Obstructive sleep apnea (adult) (pediatric): Secondary | ICD-10-CM

## 2022-07-16 DIAGNOSIS — R079 Chest pain, unspecified: Secondary | ICD-10-CM | POA: Diagnosis not present

## 2022-07-16 DIAGNOSIS — Z1389 Encounter for screening for other disorder: Secondary | ICD-10-CM | POA: Diagnosis not present

## 2022-07-16 DIAGNOSIS — M7661 Achilles tendinitis, right leg: Secondary | ICD-10-CM

## 2022-07-16 DIAGNOSIS — E559 Vitamin D deficiency, unspecified: Secondary | ICD-10-CM

## 2022-07-16 DIAGNOSIS — Z13220 Encounter for screening for lipoid disorders: Secondary | ICD-10-CM

## 2022-07-16 DIAGNOSIS — E538 Deficiency of other specified B group vitamins: Secondary | ICD-10-CM

## 2022-07-16 DIAGNOSIS — M5030 Other cervical disc degeneration, unspecified cervical region: Secondary | ICD-10-CM

## 2022-07-16 DIAGNOSIS — Z136 Encounter for screening for cardiovascular disorders: Secondary | ICD-10-CM | POA: Diagnosis not present

## 2022-07-16 DIAGNOSIS — G43809 Other migraine, not intractable, without status migrainosus: Secondary | ICD-10-CM

## 2022-07-16 DIAGNOSIS — Z131 Encounter for screening for diabetes mellitus: Secondary | ICD-10-CM | POA: Diagnosis not present

## 2022-07-16 DIAGNOSIS — I10 Essential (primary) hypertension: Secondary | ICD-10-CM

## 2022-07-16 DIAGNOSIS — E669 Obesity, unspecified: Secondary | ICD-10-CM

## 2022-07-16 DIAGNOSIS — E785 Hyperlipidemia, unspecified: Secondary | ICD-10-CM | POA: Diagnosis not present

## 2022-07-16 DIAGNOSIS — R7303 Prediabetes: Secondary | ICD-10-CM | POA: Diagnosis not present

## 2022-07-16 DIAGNOSIS — F419 Anxiety disorder, unspecified: Secondary | ICD-10-CM

## 2022-07-16 DIAGNOSIS — Z85828 Personal history of other malignant neoplasm of skin: Secondary | ICD-10-CM

## 2022-07-16 DIAGNOSIS — Z0001 Encounter for general adult medical examination with abnormal findings: Secondary | ICD-10-CM

## 2022-07-16 DIAGNOSIS — Z79899 Other long term (current) drug therapy: Secondary | ICD-10-CM | POA: Diagnosis not present

## 2022-07-16 DIAGNOSIS — W57XXXA Bitten or stung by nonvenomous insect and other nonvenomous arthropods, initial encounter: Secondary | ICD-10-CM

## 2022-07-16 DIAGNOSIS — R0789 Other chest pain: Secondary | ICD-10-CM

## 2022-07-16 DIAGNOSIS — J329 Chronic sinusitis, unspecified: Secondary | ICD-10-CM

## 2022-07-16 DIAGNOSIS — Z1329 Encounter for screening for other suspected endocrine disorder: Secondary | ICD-10-CM

## 2022-07-16 NOTE — Patient Instructions (Signed)
Alpha-gal Syndrome Alpha-gal syndrome (AGS) is an allergic reaction to a type of sugar commonly called alpha-gal. It is found in the meat and organ meats of mammals, such as cows, pigs, and sheep. It may also be found in products that come from animals, such as gelatin, medicines, medicine capsules, some milk products, vaccines, and cosmetics. AGS causes an allergic reaction that can be immediate or delayed for several hours and can range from mild to severe. A mild reaction may cause nausea, vomiting, or an itchy rash (hives). A severe reaction can cause breathing difficulties or loss of consciousness (anaphylaxis). This can be life-threatening. What are the causes? This allergy is first triggered by a tick bite from a lone star or blackleg tick. These ticks bite animals, such as cows, pigs, or sheep, and pick up the alpha-gal sugar from their blood. If the same tick bites you, it may cause your body's defense system (immune system) to produce antibodies to alpha-gal and cause the allergic reaction. What increases the risk? People who live in areas of the [place] where the lone star tick is common are at highest risk, these areas may include: [place]heastern. [place]. [place] into parts of [place]. People who are hunters and people who work in jobs that involve caring for trees (foresters) have an increased risk of this condition. What are the signs or symptoms? AGS may not cause an allergic reaction every time you eat red meat or come into contact with alpha-gal. If you do have a reaction, symptoms may include: Hives. Severe stomachache. Nausea or vomiting. Swelling of the lips, face, tongue, or throat. Making a high-pitched whistling sound when you breathe, most often when you breathe out (wheezing). Sneezing and runny nose. Headache. Symptoms of anaphylaxis may include: Difficulty breathing. Difficulty swallowing. Dizziness. Fainting. This may happen due to a sudden drop in  blood pressure. You may have AGS if you had anaphylaxis after eating something but do not have any known food allergies. Unlike other food allergies, the reaction does not start soon after the exposure to alpha-gal. There may be a delay of several hours. How is this diagnosed? This condition may be diagnosed based on signs and symptoms of the condition, especially if you have a history of tick bites and a delayed reaction to red meat. You may also have a blood test to check for antibodies to alpha-gal or a skin test to see if there is a reaction to alpha-gal. How is this treated? This condition may be treated by: Avoiding meat and organ meats that may contain alpha-gal. Avoiding medicines or other products that may contain alpha-gal. Using medicines to reduce an allergic reaction. Carrying an epinephrine auto-injector to use in case of a severe AGS reaction. AGS should be treated by an allergist or a health care provider who has experience with AGS. Follow these instructions at home:  Medicines Take over-the-counter and prescription medicines only as told by your health care provider. Follow instructions from your health care provider about when and how to use an epinephrine auto-injector. General instructions Avoid red meat and organ meat, and check food labels for meat-based ingredients in packaged foods such as soup, gravy, and flavoring. Work with your allergist to find what other foods or products you may need to avoid, some people may benefit from avoiding dairy. Keep all follow-up visits. This is important. How is this prevented? Take steps to prevent tick bites as frequent tick bites may increase the risk of an AGS reaction. These steps   include: Avoiding woods and fields with high grass. Wearing clothing protected with the anti-tick chemical permethrin when outdoors in these areas or using a U.S. Environmental Protection Agency-approved insect repellent. Checking your clothing for  ticks before you come back indoors. Checking your body for ticks when you take a shower. Checking your pets for ticks. Where to find more information Centers for Disease Control and Prevention: www.cdc.gov National Institute of Allergy and Infectious Diseases: www.niaid.nih.gov Contact a health care provider if: You have any signs or symptoms of AGS food allergy. You have a tick bite that causes a skin reaction. Get help right away if: You have a severe AGS reaction. You have trouble swallowing or breathing. These symptoms may represent a serious problem that is an emergency. Do not wait to see if the symptoms will go away. Get medical help right away. Call your local emergency services (911 in the [place] Do not drive yourself to the hospital. Summary AGS is a food allergy caused by a tick bite. Red meats, organ meats, and other products or medicines may trigger the alpha-gal reaction. AGS reactions can be mild or severe. If you have AGS, you should not eat red meat, organ meat, or products that may contain alpha-gal. Avoiding tick bites prevents AGS and more serious AGS reactions. This information is not intended to replace advice given to you by your health care provider. Make sure you discuss any questions you have with your health care provider. Document Revised: 12/25/2020 Document Reviewed: 12/25/2020 Elsevier Patient Education  2023 [employer]  

## 2022-07-17 ENCOUNTER — Other Ambulatory Visit: Payer: Self-pay | Admitting: Internal Medicine

## 2022-07-17 LAB — CBC WITH DIFFERENTIAL/PLATELET
Absolute Monocytes: 532 cells/uL (ref 200–950)
Basophils Absolute: 77 cells/uL (ref 0–200)
Basophils Relative: 1.1 %
Eosinophils Absolute: 189 cells/uL (ref 15–500)
Eosinophils Relative: 2.7 %
HCT: 43.6 % (ref 35.0–45.0)
Hemoglobin: 14.3 g/dL (ref 11.7–15.5)
Lymphs Abs: 1288 cells/uL (ref 850–3900)
MCH: 29.7 pg (ref 27.0–33.0)
MCHC: 32.8 g/dL (ref 32.0–36.0)
MCV: 90.6 fL (ref 80.0–100.0)
MPV: 11.2 fL (ref 7.5–12.5)
Monocytes Relative: 7.6 %
Neutro Abs: 4914 cells/uL (ref 1500–7800)
Neutrophils Relative %: 70.2 %
Platelets: 277 10*3/uL (ref 140–400)
RBC: 4.81 10*6/uL (ref 3.80–5.10)
RDW: 11.9 % (ref 11.0–15.0)
Total Lymphocyte: 18.4 %
WBC: 7 10*3/uL (ref 3.8–10.8)

## 2022-07-17 LAB — MICROALBUMIN / CREATININE URINE RATIO
Creatinine, Urine: 54 mg/dL (ref 20–275)
Microalb Creat Ratio: 6 mcg/mg creat (ref <30)
Microalb, Ur: 0.3 mg/dL

## 2022-07-17 LAB — INSULIN, RANDOM: Insulin: 10.9 u[IU]/mL

## 2022-07-17 LAB — COMPLETE METABOLIC PANEL WITH GFR
AG Ratio: 1.8 (calc) (ref 1.0–2.5)
ALT: 16 U/L (ref 6–29)
AST: 18 U/L (ref 10–35)
Albumin: 4.7 g/dL (ref 3.6–5.1)
Alkaline phosphatase (APISO): 113 U/L (ref 37–153)
BUN: 22 mg/dL (ref 7–25)
CO2: 26 mmol/L (ref 20–32)
Calcium: 10 mg/dL (ref 8.6–10.4)
Chloride: 104 mmol/L (ref 98–110)
Creat: 1.04 mg/dL (ref 0.50–1.05)
Globulin: 2.6 g/dL (calc) (ref 1.9–3.7)
Glucose, Bld: 91 mg/dL (ref 65–99)
Potassium: 5.1 mmol/L (ref 3.5–5.3)
Sodium: 140 mmol/L (ref 135–146)
Total Bilirubin: 0.4 mg/dL (ref 0.2–1.2)
Total Protein: 7.3 g/dL (ref 6.1–8.1)
eGFR: 61 mL/min/{1.73_m2} (ref >=60)

## 2022-07-17 LAB — URINALYSIS, ROUTINE W REFLEX MICROSCOPIC
Bilirubin Urine: NEGATIVE
Glucose, UA: NEGATIVE
Hgb urine dipstick: NEGATIVE
Ketones, ur: NEGATIVE
Leukocytes,Ua: NEGATIVE
Nitrite: NEGATIVE
Protein, ur: NEGATIVE
Specific Gravity, Urine: 1.011 (ref 1.001–1.035)
pH: 7.5 (ref 5.0–8.0)

## 2022-07-17 LAB — HEMOGLOBIN A1C
Hgb A1c MFr Bld: 5.9 % of total Hgb — ABNORMAL HIGH (ref <5.7)
Mean Plasma Glucose: 123 mg/dL
eAG (mmol/L): 6.8 mmol/L

## 2022-07-17 LAB — MAGNESIUM: Magnesium: 2.4 mg/dL (ref 1.5–2.5)

## 2022-07-17 LAB — VITAMIN B12: Vitamin B-12: 696 pg/mL (ref 200–1100)

## 2022-07-17 LAB — LIPID PANEL
Cholesterol: 172 mg/dL (ref <200)
HDL: 44 mg/dL — ABNORMAL LOW (ref >=50)
LDL Cholesterol (Calc): 106 mg/dL (calc) — ABNORMAL HIGH
Non-HDL Cholesterol (Calc): 128 mg/dL (calc) (ref <130)
Total CHOL/HDL Ratio: 3.9 (calc) (ref <5.0)
Triglycerides: 127 mg/dL (ref <150)

## 2022-07-17 LAB — VITAMIN D 25 HYDROXY (VIT D DEFICIENCY, FRACTURES): Vit D, 25-Hydroxy: 61 ng/mL (ref 30–100)

## 2022-07-17 LAB — TSH: TSH: 2.37 mIU/L (ref 0.40–4.50)

## 2022-07-19 LAB — INTERPRETATION:

## 2022-07-19 LAB — ALPHA-GAL PANEL
Allergen, Mutton, f88: <0.1 kU/L
Allergen, Pork, f26: <0.1 kU/L
Beef: <0.1 kU/L
CLASS: 0
CLASS: 0
Class: 0
GALACTOSE-ALPHA-1,3-GALACTOSE IGE*: <0.1 kU/L (ref <0.10)

## 2022-07-21 ENCOUNTER — Encounter: Payer: Self-pay | Admitting: Nurse Practitioner

## 2022-07-21 DIAGNOSIS — J329 Chronic sinusitis, unspecified: Secondary | ICD-10-CM

## 2022-07-21 DIAGNOSIS — J3089 Other allergic rhinitis: Secondary | ICD-10-CM

## 2022-07-28 DIAGNOSIS — G4733 Obstructive sleep apnea (adult) (pediatric): Secondary | ICD-10-CM | POA: Diagnosis not present

## 2022-08-12 DIAGNOSIS — G4733 Obstructive sleep apnea (adult) (pediatric): Secondary | ICD-10-CM | POA: Diagnosis not present

## 2022-08-27 DIAGNOSIS — L814 Other melanin hyperpigmentation: Secondary | ICD-10-CM | POA: Diagnosis not present

## 2022-08-27 DIAGNOSIS — D225 Melanocytic nevi of trunk: Secondary | ICD-10-CM | POA: Diagnosis not present

## 2022-08-27 DIAGNOSIS — G4733 Obstructive sleep apnea (adult) (pediatric): Secondary | ICD-10-CM | POA: Diagnosis not present

## 2022-08-27 DIAGNOSIS — L570 Actinic keratosis: Secondary | ICD-10-CM | POA: Diagnosis not present

## 2022-08-27 DIAGNOSIS — L821 Other seborrheic keratosis: Secondary | ICD-10-CM | POA: Diagnosis not present

## 2022-09-10 ENCOUNTER — Encounter: Payer: Self-pay | Admitting: Nurse Practitioner

## 2022-09-10 ENCOUNTER — Other Ambulatory Visit: Payer: Self-pay

## 2022-09-10 ENCOUNTER — Ambulatory Visit (INDEPENDENT_AMBULATORY_CARE_PROVIDER_SITE_OTHER): Payer: BC Managed Care – PPO | Admitting: Nurse Practitioner

## 2022-09-10 VITALS — BP 162/90 | HR 66 | Temp 97.5°F | Ht 66.0 in | Wt 184.2 lb

## 2022-09-10 DIAGNOSIS — Z1152 Encounter for screening for COVID-19: Secondary | ICD-10-CM | POA: Diagnosis not present

## 2022-09-10 DIAGNOSIS — I10 Essential (primary) hypertension: Secondary | ICD-10-CM | POA: Diagnosis not present

## 2022-09-10 DIAGNOSIS — R6889 Other general symptoms and signs: Secondary | ICD-10-CM | POA: Diagnosis not present

## 2022-09-10 DIAGNOSIS — J069 Acute upper respiratory infection, unspecified: Secondary | ICD-10-CM | POA: Diagnosis not present

## 2022-09-10 LAB — POC COVID19 BINAXNOW: SARS Coronavirus 2 Ag: NEGATIVE

## 2022-09-10 LAB — POCT INFLUENZA A/B
Influenza A, POC: NEGATIVE
Influenza B, POC: NEGATIVE

## 2022-09-10 MED ORDER — DEXAMETHASONE 1 MG PO TABS: ORAL_TABLET | ORAL | 0 refills | Status: DC

## 2022-09-10 MED ORDER — AZITHROMYCIN 250 MG PO TABS: ORAL_TABLET | ORAL | 1 refills | Status: DC

## 2022-09-10 MED ORDER — BENZONATATE 100 MG PO CAPS: 100.0000 mg | ORAL_CAPSULE | Freq: Four times a day (QID) | ORAL | 1 refills | Status: DC | PRN

## 2022-09-10 MED ORDER — PROMETHAZINE-DM 6.25-15 MG/5ML PO SYRP: 5.0000 mL | ORAL_SOLUTION | Freq: Four times a day (QID) | ORAL | 1 refills | Status: DC | PRN

## 2022-09-10 NOTE — Patient Instructions (Signed)

## 2022-09-10 NOTE — Progress Notes (Signed)
Assessment and Plan:  Diagnoses and all orders for this visit:  Flu-like symptoms -     POCT Influenza A/B- negative  Encounter for screening for COVID-19 -     POC COVID-19- negative  URI, acute Push fluids Advised to use Mucinex rather than sudafed as it can raise he BP -     dexamethasone (DECADRON) 1 MG tablet; Take 3 tabs for 3 days, 2 tabs for 3 days 1 tab for 5 days. Take with food. -     azithromycin (ZITHROMAX) 250 MG tablet; Take 2 tablets (500 mg) on  Day 1,  followed by 1 tablet (250 mg) once daily on Days 2 through 5. -     promethazine-dextromethorphan (PROMETHAZINE-DM) 6.25-15 MG/5ML syrup; Take 5 mLs by mouth 4 (four) times daily as needed for cough. -     benzonatate (TESSALON PERLES) 100 MG capsule; Take 1 capsule (100 mg total) by mouth every 6 (six) hours as needed for cough. If no improvement in the next several days notify the office  Essential hypertension - continue medications, DASH diet, exercise and monitor at home. Call if greater than 130/80.       Further disposition pending results of labs. Discussed med's effects and SE's.   Over 30 minutes of exam, counseling, chart review, and critical decision making was performed.   Future Appointments  Date Time Provider [place]  10/03/2022 10:00 AM Larose Kells, MD [facility] None  01/14/2023 10:30 AM Darrol Jump, NP [facility] None  02/11/2023  1:00 PM Lomax, Amy, NP [facility] None  07/20/2023  9:00 AM Darrol Jump, NP [facility] None    ------------------------------------------------------------------------------------------------------------------   HPI BP (!) 162/90   Pulse 66   Temp (!) 97.5 F (36.4 C)   Ht '5\' 6"'$  (1.676 m)   Wt 184 lb 3.2 oz (83.6 kg)   LMP 09/05/2012   SpO2 98%   BMI 29.73 kg/m   61 y.o.female presents for weakness/fatigue, runny nose, productive cough of green mucus and headache x 1 week.  Has been taking Promethazine DM cough syrup. Denies fever, nausea,  vomiting and diarrhea.   She is to be taking Olmesartan 40 mg 1/2 tab when BP greater than 130/90 but has not been taking it regularly. She has been using Sudafed so has had to take 1/2 tab of olmesartan the past 2 days BP Readings from Last 3 Encounters:  09/10/22 (!) 162/90  07/16/22 120/80  02/05/22 (!) 155/102   BMI is Body mass index is 29.73 kg/m., she has not been working on diet and exercise. Wt Readings from Last 3 Encounters:  09/10/22 184 lb 3.2 oz (83.6 kg)  07/16/22 175 lb 6.4 oz (79.6 kg)  02/05/22 180 lb 8 oz (81.9 kg)   She has had 4-5 migraines in the past month - uses amerge as neeeded.  Past Medical History:  Diagnosis Date   Achilles tendinitis    Acid reflux 07/09/2020   Anxiety    Basal cell carcinoma of nose 05/23/2015   Endometrial hyperplasia without atypia, simple 09/10/2012   Endometrial hyperplasia without atypia, simple 09/10/2012   HSV-1 infection    HTN (hypertension)    Hyperlipidemia    Migraine    PONV (postoperative nausea and vomiting)      Allergies  Allergen Reactions   Penicillins     REACTION: hives   Pravastatin Other (See Comments)    myalgias    Current Outpatient Medications on File Prior to Visit  Medication Sig  ALPRAZolam (XANAX) 0.5 MG tablet TAKE 1 TABLET BY MOUTH 2 TIMES DAILY AS NEEDED   aspirin 81 MG EC tablet Take 81 mg by mouth daily.   CHELATED MAGNESIUM PO Take by mouth daily.   cholecalciferol (VITAMIN D) 1000 UNITS tablet Take 1,000 Units by mouth daily.   ezetimibe (ZETIA) 10 MG tablet TAKE 1 TABLET DAILY   fluocinonide cream (LIDEX) 7.51 % Apply 1 application topically 2 (two) times daily.   fluticasone (FLONASE) 50 MCG/ACT nasal spray Place 2 sprays into both nostrils at bedtime.   ketoconazole (NIZORAL) 2 % cream Apply 1 application topically daily.   meloxicam (MOBIC) 15 MG tablet Take 1 tab daily with food for 2-4 weeks then daily as needed. Do not take with ibuprofen or aleve.   Multiple Vitamin  (MULTIVITAMIN) tablet Take 1 tablet by mouth daily.   Omega-3 Fatty Acids (FISH OIL) 1200 MG CAPS Take 1 capsule by mouth daily.   Pseudoephedrine HCl (SUDAFED PO) Take by mouth.   Triprolidine-Pseudoephedrine (ANTIHISTAMINE PO) Take by mouth.   valACYclovir (VALTREX) 500 MG tablet Take     1 tablet     Daily  -  prophylaxis for Cold Sores / Fever Blisters (Patient taking differently: as needed. Take     1 tablet     Daily  -  prophylaxis for Cold Sores / Fever Blisters)   venlafaxine XR (EFFEXOR-XR) 37.5 MG 24 hr capsule Take 37.5 mg by mouth daily. For hot flashes.   famotidine (PEPCID) 10 MG tablet Take 10 mg by mouth 2 (two) times daily as needed. (Patient not taking: Reported on 07/16/2022)   olmesartan (BENICAR) 40 MG tablet TAKE 1 TABLET DAILY FOR BLOOD PRESSURE (Patient not taking: Reported on 09/10/2022)   No current facility-administered medications on file prior to visit.    ROS: all negative except above.   Physical Exam:  BP (!) 162/90   Pulse 66   Temp (!) 97.5 F (36.4 C)   Ht '5\' 6"'$  (1.676 m)   Wt 184 lb 3.2 oz (83.6 kg)   LMP 09/05/2012   SpO2 98%   BMI 29.73 kg/m   General Appearance: Well nourished, in no apparent distress. Eyes: PERRLA, EOMs, conjunctiva no swelling or erythema Sinuses: Positive maxillary tenderness ENT/Mouth: Ext aud canals clear, TMs without erythema, bulging. No erythema, swelling, or exudate on post pharynx.  Tonsils not swollen or erythematous. Hearing normal.  Neck: Supple, thyroid normal.  Respiratory: Respiratory effort normal, BS equal bilaterally without rales, rhonchi, wheezing or stridor.  Cardio: RRR with no MRGs. Brisk peripheral pulses without edema.  Abdomen: Soft, + BS.  Non tender, no guarding, rebound, hernias, masses. Lymphatics: Positive tender cervical adenopathy Musculoskeletal: Full ROM, 5/5 strength, normal gait.  Skin: Warm, dry without rashes, lesions, ecchymosis.  Neuro: Cranial nerves intact. Normal muscle tone, no  cerebellar symptoms. Sensation intact.  Psych: Awake and oriented X 3, normal affect, Insight and Judgment appropriate.     Alycia Rossetti, NP 10:19 AM [facility]

## 2022-09-16 ENCOUNTER — Encounter: Payer: Self-pay | Admitting: Nurse Practitioner

## 2022-09-24 DIAGNOSIS — U071 COVID-19: Secondary | ICD-10-CM | POA: Diagnosis not present

## 2022-09-24 DIAGNOSIS — J029 Acute pharyngitis, unspecified: Secondary | ICD-10-CM | POA: Diagnosis not present

## 2022-09-24 DIAGNOSIS — J0100 Acute maxillary sinusitis, unspecified: Secondary | ICD-10-CM | POA: Diagnosis not present

## 2022-09-24 DIAGNOSIS — J019 Acute sinusitis, unspecified: Secondary | ICD-10-CM | POA: Diagnosis not present

## 2022-10-03 ENCOUNTER — Ambulatory Visit (INDEPENDENT_AMBULATORY_CARE_PROVIDER_SITE_OTHER): Payer: BC Managed Care – PPO | Admitting: Internal Medicine

## 2022-10-03 ENCOUNTER — Encounter: Payer: Self-pay | Admitting: Internal Medicine

## 2022-10-03 ENCOUNTER — Other Ambulatory Visit: Payer: Self-pay

## 2022-10-03 VITALS — BP 154/100 | HR 70 | Temp 97.9°F | Resp 12 | Ht 65.75 in | Wt 182.8 lb

## 2022-10-03 DIAGNOSIS — H1013 Acute atopic conjunctivitis, bilateral: Secondary | ICD-10-CM | POA: Diagnosis not present

## 2022-10-03 DIAGNOSIS — J3089 Other allergic rhinitis: Secondary | ICD-10-CM | POA: Diagnosis not present

## 2022-10-03 DIAGNOSIS — J301 Allergic rhinitis due to pollen: Secondary | ICD-10-CM

## 2022-10-03 MED ORDER — FLUTICASONE PROPIONATE 50 MCG/ACT NA SUSP: 2.0000 | Freq: Every day | NASAL | 5 refills | Status: DC

## 2022-10-03 MED ORDER — AZELASTINE HCL 0.1 % NA SOLN: 1.0000 | Freq: Two times a day (BID) | NASAL | 5 refills | Status: DC

## 2022-10-03 MED ORDER — CETIRIZINE HCL 10 MG PO TABS: 10.0000 mg | ORAL_TABLET | Freq: Every day | ORAL | 5 refills | Status: DC | PRN

## 2022-10-03 NOTE — Patient Instructions (Addendum)
Allergic Rhinitis: - Positive skin test 09/2022: red cedar - Avoidance measures discussed. - Use nasal saline rinses before nose sprays such as with Neilmed Sinus Rinse.  Use distilled water.   - Use Flonase 2 sprays each nostril daily. Aim upward and outward. - Use Azelastine 1-2 sprays each nostril twice daily as needed. Aim upward and outward. - Use Zyrtec 10 mg daily as needed runny nose, sneezing, itching.   ALLERGEN AVOIDANCE MEASURES Pollen Avoidance Pollen levels are highest during the mid-day and afternoon.  Consider this when planning outdoor activities. Avoid being outside when the grass is being mowed, or wear a mask if the pollen-allergic person must be the one to mow the grass. Keep the windows closed to keep pollen outside of the home. Use an air conditioner to filter the air. Take a shower, wash hair, and change clothing after working or playing outdoors during pollen season.  Return in about 6 weeks (around 11/14/2022).

## 2022-10-03 NOTE — Progress Notes (Signed)
NEW PATIENT  Date of Service/Encounter:  10/03/22  Consult requested by: Unk Pinto, MD   Subjective:   Natasha Fritz (DOB: [birth year]) is a 62 y.o. female who presents to the clinic on 10/03/2022 with a chief complaint of Establish Care and Allergy Testing .    History obtained from: chart review and patient.  Rhinitis:  Started around several years ago but worse the past year. Symptoms include: nasal congestion, rhinorrhea, post nasal drainage, sneezing, watery eyes, and itchy eyes  She has OSA on CPAP with a humidifier.  This was started around the same time as her symptoms started worsening.  She did recently have COVID and was treated with Paxlovid and antibiotics.  She had required antibiotics about twice this year for sinus infections.  No other GI or skin or lower respiratory infections.  Also got a new dog around the time her symptoms worsened but has had dogs in the past without issues.  Occurs year-round Potential triggers: CPAP machine  Treatments tried:  Afrin a few times a year Saline rinses  Flonase but can't recall   Previous allergy testing: no History of reflux/heartburn: no History of sinus surgery: no Nonallergic triggers: none   Past Medical History: Past Medical History:  Diagnosis Date   Achilles tendinitis    Acid reflux 07/09/2020   Anxiety    Basal cell carcinoma of nose 05/23/2015   Endometrial hyperplasia without atypia, simple 09/10/2012   Endometrial hyperplasia without atypia, simple 09/10/2012   HSV-1 infection    HTN (hypertension)    Hyperlipidemia    Migraine    PONV (postoperative nausea and vomiting)    Recurrent upper respiratory infection (URI)    Urticaria     Past Surgical History: Past Surgical History:  Procedure Laterality Date   ACHILLES TENDON SURGERY Right 10/19/2020   Dr. Milinda Pointer   acl replacement     BILATERAL SALPINGECTOMY  09/10/2012   Procedure: BILATERAL SALPINGECTOMY;  Surgeon: Elveria Royals, MD;   Location: [facility];  Service: Gynecology;  Laterality: Bilateral;   CERVICAL DISC SURGERY  2012   fusion, Dr. Joya Salm   COLONOSCOPY  2011   Lionel December release Bilateral 2019   Dr. Burney Gauze    ROBOTIC ASSISTED TOTAL HYSTERECTOMY  09/10/2012   Procedure: ROBOTIC ASSISTED TOTAL HYSTERECTOMY;  Surgeon: Elveria Royals, MD;  Location: [facility];  Service: Gynecology;  Laterality: N/A;  3 hrs.   TUBAL LIGATION  1994    Family History: Family History  Problem Relation Age of Onset   Hypertension Mother    Stroke Mother    Breast cancer Mother    Lung cancer Mother    Alzheimer's disease Father    Hyperlipidemia Brother    Heart disease Brother    Diabetes Brother    Hypertension Brother    Hypertension Brother    CVA Brother 10   Cancer Maternal Grandfather    Asthma Daughter    Esophageal cancer Neg Hx    Colon cancer Neg Hx    Colon polyps Neg Hx    Stomach cancer Neg Hx    Rectal cancer Neg Hx     Social History:  Lives in a 70 year house Flooring in bedroom: carpet Pets: cat and dog Tobacco use/exposure: none Job: IT  Medication List:  Allergies as of 10/03/2022       Reactions   Penicillins    REACTION: hives   Pravastatin Other (See Comments)   myalgias  Medication List        Accurate as of October 03, 2022 12:01 PM. If you have any questions, ask your nurse or doctor.          STOP taking these medications    azithromycin 250 MG tablet Commonly known as: Zithromax Stopped by: Larose Kells, MD   dexamethasone 1 MG tablet Commonly known as: DECADRON Stopped by: Larose Kells, MD       TAKE these medications    ALPRAZolam 0.5 MG tablet Commonly known as: XANAX TAKE 1 TABLET BY MOUTH 2 TIMES DAILY AS NEEDED   ANTIHISTAMINE PO Take by mouth.   aspirin EC 81 MG tablet Take 81 mg by mouth daily.   benzonatate 100 MG capsule Commonly known as: Tessalon Perles Take 1 capsule (100 mg total) by mouth every 6 (six) hours as needed for  cough.   CHELATED MAGNESIUM PO Take by mouth daily.   cholecalciferol 1000 units tablet Commonly known as: VITAMIN D Take 1,000 Units by mouth daily.   ezetimibe 10 MG tablet Commonly known as: ZETIA TAKE 1 TABLET DAILY   Fish Oil 1200 MG Caps Take 1 capsule by mouth daily.   fluocinonide cream 0.05 % Commonly known as: LIDEX Apply 1 application topically 2 (two) times daily.   fluticasone 50 MCG/ACT nasal spray Commonly known as: Flonase Place 2 sprays into both nostrils at bedtime.   ketoconazole 2 % cream Commonly known as: NIZORAL Apply 1 application topically daily.   meloxicam 15 MG tablet Commonly known as: MOBIC Take 1 tab daily with food for 2-4 weeks then daily as needed. Do not take with ibuprofen or aleve.   multivitamin tablet Take 1 tablet by mouth daily.   olmesartan 40 MG tablet Commonly known as: BENICAR TAKE 1 TABLET DAILY FOR BLOOD PRESSURE   promethazine-dextromethorphan 6.25-15 MG/5ML syrup Commonly known as: PROMETHAZINE-DM Take 5 mLs by mouth 4 (four) times daily as needed for cough.   SUDAFED PO Take by mouth.   valACYclovir 500 MG tablet Commonly known as: VALTREX Take     1 tablet     Daily  -  prophylaxis for Cold Sores / Fever Blisters What changed:  when to take this reasons to take this   venlafaxine XR 37.5 MG 24 hr capsule Commonly known as: EFFEXOR-XR Take 37.5 mg by mouth daily. For hot flashes.         REVIEW OF SYSTEMS: Pertinent positives and negatives discussed in HPI.   Objective:   Physical Exam: BP (!) 154/100   Pulse 70   Temp 97.9 F (36.6 C) (Temporal)   Resp 12   Ht 5' 5.75" (1.67 m)   Wt 182 lb 12.8 oz (82.9 kg)   LMP 09/05/2012   SpO2 97%   BMI 29.73 kg/m  Body mass index is 29.73 kg/m. GEN: alert, well developed HEENT: clear conjunctiva, TM grey and translucent, nose with + inferior turbinate hypertrophy, pale nasal mucosa, slight clear rhinorrhea, + cobblestoning HEART: regular rate and  rhythm, no murmur LUNGS: clear to auscultation bilaterally, no coughing, unlabored respiration ABDOMEN: soft, non distended  SKIN: no rashes or lesions  Reviewed:  09/10/2022: seen by Carlye Grippe NP for runny nose, mucous, headache, fatigue x1 week, thought to be viral URI.  Flu and COVID were negative.  Prescribed decadron and azithromycin.   07/07/2022: seen by Tanner PA for acute maxillary sinusitis with recent recurrence, started on Bactrim and Medrol dose pack.  07/01/2022: seen by Satira Sark PA for  acute maxillary sinusitis, started on azithromycin.  02/05/2022: seen for follow up for OSA on CPAP and adjusted settings and mask due to possible leak. Compliance was excellent.  Large leak noted and told to change mask regularly.   Skin Testing:  Skin prick testing was placed, which includes aeroallergens/foods, histamine control, and saline control.  Verbal consent was obtained prior to placing test.  Patient tolerated procedure well.  Allergy testing results were read and interpreted by myself, documented by clinical staff. Adequate positive and negative control.  Results discussed with patient/family.  Airborne Adult Perc - 10/03/22 1106     Time Antigen Placed 1107    Allergen Manufacturer Lavella Hammock    Location Back    Number of Test 59    Panel 1 Select    1. Control-Buffer 50% Glycerol Negative    2. Control-Histamine 1 mg/ml 3+    3. Albumin saline Negative    4. [place] Negative    5. [place] Negative    6. Johnson Negative    7. Bertrand Blue Negative    8. Meadow Fescue Negative    9. Perennial Rye Negative    10. Sweet Vernal Negative    11. Timothy Negative    12. Cocklebur Negative    13. Burweed Marshelder Negative    14. Ragweed, short Negative    15. Ragweed, Giant Negative    16. Plantain,  English Negative    17. Lamb's Quarters Negative    18. Sheep Sorrell Negative    19. Rough Pigweed Negative    20. Marsh Elder, Rough Negative    21. Mugwort, Common Negative     22. Ash mix Negative    23. Birch mix Negative    24. Beech American Negative    25. Box, Elder Negative    26. Cedar, red 3+    27. Cottonwood, [place] Negative    28. Elm mix Negative    29. Hickory Negative    30. Maple mix Negative    31. Oak, [place] mix Negative    32. Pecan Pollen Negative    33. Pine mix Negative    34. Sycamore Eastern Negative    35. [place], Black Pollen Negative    36. Alternaria alternata Negative    37. Cladosporium Herbarum Negative    38. Aspergillus mix Negative    39. Penicillium mix Negative    40. Bipolaris sorokiniana (Helminthosporium) Negative    41. Drechslera spicifera (Curvularia) Negative    42. Mucor plumbeus Negative    43. Fusarium moniliforme Negative    44. Aureobasidium pullulans (pullulara) Negative    45. Rhizopus oryzae Negative    46. Botrytis cinera Negative    47. Epicoccum nigrum Negative    48. Phoma betae Negative    49. Candida Albicans Negative    50. Trichophyton mentagrophytes Negative    51. Mite, D Farinae  5,000 AU/ml Negative    52. Mite, D Pteronyssinus  5,000 AU/ml Negative    53. Cat Hair 10,000 BAU/ml Negative    54.  Dog Epithelia Negative    55. Mixed Feathers Negative    56. Horse Epithelia Negative    57. Cockroach, German Negative    58. Mouse Negative    59. Tobacco Leaf Negative             Intradermal - 10/03/22 1157     Time Antigen Placed 1137    Allergen Manufacturer Lavella Hammock    Location Back    Number of  Test 14    Control Negative    Guatem[place] Negative    Johnson Negative    7 Grass Negative    Ragweed mix Negative    Weed mix Negative    Mold 1 Negative    Mold 2 Negative    Mold 3 Negative    Mold 4 Negative    Cat Negative    Dog Negative    Cockroach Negative    Mite mix Negative               Assessment:   1. Perennial allergic rhinitis   2. Allergic conjunctivitis of both eyes     Plan/Recommendations:  Allergic Rhinitis Allergic Conjunctivitis  -  Positive skin test 09/2022: red cedar - Avoidance measures discussed. - Use nasal saline rinses before nose sprays such as with Neilmed Sinus Rinse.  Use distilled water.   - Use Flonase 2 sprays each nostril daily. Aim upward and outward. - Use Azelastine 1-2 sprays each nostril twice daily as needed. Aim upward and outward. - Use Zyrtec 10 mg daily as needed runny nose, sneezing, itching.   Return in about 6 weeks (around 11/14/2022).  Harlon Flor, MD [facility]

## 2022-11-04 ENCOUNTER — Other Ambulatory Visit: Payer: Self-pay

## 2022-11-04 MED ORDER — EZETIMIBE 10 MG PO TABS: 10.0000 mg | ORAL_TABLET | Freq: Every day | ORAL | 3 refills | Status: DC

## 2022-11-18 ENCOUNTER — Ambulatory Visit: Payer: BC Managed Care – PPO | Admitting: Internal Medicine

## 2022-12-02 ENCOUNTER — Encounter: Payer: Self-pay | Admitting: Internal Medicine

## 2022-12-02 ENCOUNTER — Other Ambulatory Visit: Payer: Self-pay

## 2022-12-02 ENCOUNTER — Ambulatory Visit (INDEPENDENT_AMBULATORY_CARE_PROVIDER_SITE_OTHER): Payer: BC Managed Care – PPO | Admitting: Internal Medicine

## 2022-12-02 VITALS — BP 138/100 | HR 75 | Temp 98.1°F | Resp 16 | Wt 188.1 lb

## 2022-12-02 DIAGNOSIS — J301 Allergic rhinitis due to pollen: Secondary | ICD-10-CM

## 2022-12-02 MED ORDER — AZELASTINE HCL 0.1 % NA SOLN: 1.0000 | Freq: Two times a day (BID) | NASAL | 11 refills | Status: AC

## 2022-12-02 MED ORDER — CETIRIZINE HCL 10 MG PO TABS: 10.0000 mg | ORAL_TABLET | Freq: Every day | ORAL | 11 refills | Status: DC | PRN

## 2022-12-02 MED ORDER — FLUTICASONE PROPIONATE 50 MCG/ACT NA SUSP: 2.0000 | Freq: Every day | NASAL | 11 refills | Status: AC

## 2022-12-02 NOTE — Patient Instructions (Addendum)
Allergic Rhinitis: - Positive skin test 09/2022: red cedar - Avoidance measures discussed. - Use nasal saline rinses before nose sprays such as with Neilmed Sinus Rinse.  Use distilled water.   - Use Flonase 2 sprays each nostril daily. Aim upward and outward. - Use Azelastine 1-2 sprays each nostril twice daily as needed. Aim upward and outward. - Use Zyrtec 10 mg daily as needed runny nose, sneezing, itching.   ALLERGEN AVOIDANCE MEASURES Pollen Avoidance Pollen levels are highest during the mid-day and afternoon.  Consider this when planning outdoor activities. Avoid being outside when the grass is being mowed, or wear a mask if the pollen-allergic person must be the one to mow the grass. Keep the windows closed to keep pollen outside of the home. Use an air conditioner to filter the air. Take a shower, wash hair, and change clothing after working or playing outdoors during pollen season.  Return in about 1 year (around 12/02/2023).

## 2022-12-02 NOTE — Progress Notes (Signed)
   FOLLOW UP Date of Service/Encounter:  12/02/22   Subjective:  Natasha Fritz (DOB: 07-Nov-1960) is a 62 y.o. female who returns to the [facility] on 12/02/2022 for follow up for allergic rhinoconjunctivitis.   History obtained from: chart review and patient. Last visit was with me 10/03/2022 for allergic rhinoconjunctivitis and was positive SPT to red cedar.  Started on Flonase/Azelastine/Zyrtec.  She was doing well until a couple of days ago.  Reports increased sneezing, runny nose and congestion the past few days.  Doing saline rinses followed by Flonase and Zyrtec daily.  Also recently added Azelastine in the past few days.  Denies much issues with ocular symptoms.   Past Medical History: Past Medical History:  Diagnosis Date   Achilles tendinitis    Acid reflux 07/09/2020   Anxiety    Basal cell carcinoma of nose 05/23/2015   Endometrial hyperplasia without atypia, simple 09/10/2012   Endometrial hyperplasia without atypia, simple 09/10/2012   HSV-1 infection    HTN (hypertension)    Hyperlipidemia    Migraine    PONV (postoperative nausea and vomiting)    Recurrent upper respiratory infection (URI)    Urticaria     Objective:  BP (!) 130/100   Pulse 75   Temp 98.1 F (36.7 C) (Temporal)   Resp 16   Wt 188 lb 1.6 oz (85.3 kg)   LMP 09/05/2012   SpO2 96%   BMI 30.59 kg/m  Body mass index is 30.59 kg/m. Physical Exam: GEN: alert, well developed HEENT: clear conjunctiva, TM grey and translucent, nose with mild inferior turbinate hypertrophy, pink nasal mucosa, clear rhinorrhea, no cobblestoning HEART: regular rate and rhythm, no murmur LUNGS: clear to auscultation bilaterally, no coughing, unlabored respiration SKIN: no rashes or lesions  BP elevated but informed to keep an eye on it and follow up with PCP if it remains high .  Assessment:   1. Non-seasonal allergic rhinitis due to pollen     Plan/Recommendations:  Allergic Rhinitis: - Not  well controlled, discussed addition of INAH to her INCS/OAH regimen.  - Positive skin test 09/2022: red cedar - Avoidance measures discussed. - Use nasal saline rinses before nose sprays such as with Neilmed Sinus Rinse.  Use distilled water.   - Use Flonase 2 sprays each nostril daily. Aim upward and outward. - Use Azelastine 1-2 sprays each nostril twice daily as needed. Aim upward and outward. - Use Zyrtec 10 mg daily as needed runny nose, sneezing, itching.   ALLERGEN AVOIDANCE MEASURES Pollen Avoidance Pollen levels are highest during the mid-day and afternoon.  Consider this when planning outdoor activities. Avoid being outside when the grass is being mowed, or wear a mask if the pollen-allergic person must be the one to mow the grass. Keep the windows closed to keep pollen outside of the home. Use an air conditioner to filter the air. Take a shower, wash hair, and change clothing after working or playing outdoors during pollen season.   Return in about 1 year (around 12/02/2023).  Harlon Flor, MD [facility]

## 2023-01-04 ENCOUNTER — Other Ambulatory Visit: Payer: Self-pay

## 2023-01-04 ENCOUNTER — Encounter (HOSPITAL_BASED_OUTPATIENT_CLINIC_OR_DEPARTMENT_OTHER): Payer: Self-pay

## 2023-01-04 ENCOUNTER — Emergency Department (HOSPITAL_BASED_OUTPATIENT_CLINIC_OR_DEPARTMENT_OTHER): Payer: BC Managed Care – PPO

## 2023-01-04 ENCOUNTER — Emergency Department (HOSPITAL_BASED_OUTPATIENT_CLINIC_OR_DEPARTMENT_OTHER)
Admission: EM | Admit: 2023-01-04 | Discharge: 2023-01-04 | Disposition: A | Discharge status: Home / Self Care | Payer: BC Managed Care – PPO | Attending: Emergency Medicine | Admitting: Emergency Medicine

## 2023-01-04 DIAGNOSIS — K8020 Calculus of gallbladder without cholecystitis without obstruction: Secondary | ICD-10-CM | POA: Diagnosis not present

## 2023-01-04 DIAGNOSIS — Z7982 Long term (current) use of aspirin: Secondary | ICD-10-CM | POA: Diagnosis not present

## 2023-01-04 DIAGNOSIS — K8080 Other cholelithiasis without obstruction: Secondary | ICD-10-CM | POA: Diagnosis not present

## 2023-01-04 DIAGNOSIS — D72829 Elevated white blood cell count, unspecified: Secondary | ICD-10-CM | POA: Diagnosis not present

## 2023-01-04 DIAGNOSIS — R109 Unspecified abdominal pain: Secondary | ICD-10-CM | POA: Diagnosis not present

## 2023-01-04 LAB — COMPREHENSIVE METABOLIC PANEL
ALT: 18 U/L (ref 0–44)
AST: 20 U/L (ref 15–41)
Albumin: 4.8 g/dL (ref 3.5–5.0)
Alkaline Phosphatase: 90 U/L (ref 38–126)
Anion gap: 11 (ref 5–15)
BUN: 15 mg/dL (ref 8–23)
CO2: 23 mmol/L (ref 22–32)
Calcium: 9.7 mg/dL (ref 8.9–10.3)
Chloride: 103 mmol/L (ref 98–111)
Creatinine, Ser: 0.69 mg/dL (ref 0.44–1.00)
GFR, Estimated: >60 mL/min (ref >60)
Glucose, Bld: 145 mg/dL — ABNORMAL HIGH (ref 70–99)
Potassium: 3.9 mmol/L (ref 3.5–5.1)
Sodium: 137 mmol/L (ref 135–145)
Total Bilirubin: 0.6 mg/dL (ref 0.3–1.2)
Total Protein: 7.4 g/dL (ref 6.5–8.1)

## 2023-01-04 LAB — CBC
HCT: 40.3 % (ref 36.0–46.0)
Hemoglobin: 13.7 g/dL (ref 12.0–15.0)
MCH: 30.6 pg (ref 26.0–34.0)
MCHC: 34 g/dL (ref 30.0–36.0)
MCV: 90 fL (ref 80.0–100.0)
Platelets: 256 10*3/uL (ref 150–400)
RBC: 4.48 MIL/uL (ref 3.87–5.11)
RDW: 12.8 % (ref 11.5–15.5)
WBC: 12.2 10*3/uL — ABNORMAL HIGH (ref 4.0–10.5)
nRBC: 0 % (ref 0.0–0.2)

## 2023-01-04 LAB — LIPASE, BLOOD: Lipase: 14 U/L (ref 11–51)

## 2023-01-04 MED ORDER — ONDANSETRON HCL 4 MG/2ML IJ SOLN
4.0000 mg | Freq: Once | INTRAMUSCULAR | Status: AC
  Administered 2023-01-04: 4 mg via INTRAVENOUS
  Filled 2023-01-04: qty 2

## 2023-01-04 MED ORDER — IOHEXOL 300 MG/ML  SOLN
100.0000 mL | Freq: Once | INTRAMUSCULAR | Status: AC | PRN
  Administered 2023-01-04: 80 mL via INTRAVENOUS

## 2023-01-04 MED ORDER — OXYCODONE-ACETAMINOPHEN 5-325 MG PO TABS: 1.0000 | ORAL_TABLET | Freq: Four times a day (QID) | ORAL | 0 refills | Status: DC | PRN

## 2023-01-04 MED ORDER — ONDANSETRON HCL 4 MG PO TABS: 4.0000 mg | ORAL_TABLET | Freq: Three times a day (TID) | ORAL | 0 refills | Status: DC | PRN

## 2023-01-04 MED ORDER — OXYCODONE-ACETAMINOPHEN 5-325 MG PO TABS
1.0000 | ORAL_TABLET | Freq: Once | ORAL | Status: AC
  Administered 2023-01-04: 1 via ORAL
  Filled 2023-01-04: qty 1

## 2023-01-04 MED ORDER — SODIUM CHLORIDE 0.9 % IV BOLUS
1000.0000 mL | Freq: Once | INTRAVENOUS | Status: AC
  Administered 2023-01-04: 1000 mL via INTRAVENOUS

## 2023-01-04 NOTE — ED Notes (Signed)
Patient transported to CT 

## 2023-01-04 NOTE — ED Provider Notes (Signed)
[facility] AT [facility] Provider Note   CSN: [id number] Arrival date & time: 01/04/23  1247     History  Chief Complaint  Patient presents with   Abdominal Pain    BERDELLA ROXBURY is a 62 y.o. female who presents emergency department with concerns for diffuse abdominal pain onset last night.  Denies sick contacts.  Denies history of similar symptoms.  Has associated nausea.  Patient's last bowel movement was last night and normal for her.  No meds tried prior to arrival.  Denies diarrhea, constipation, urinary symptoms, chest pain, shortness of breath.  Patient still has her gallbladder and appendix.  The history is provided by the patient. No language interpreter was used.       Home Medications Prior to Admission medications   Medication Sig Start Date End Date Taking? Authorizing Provider  ondansetron (ZOFRAN) 4 MG tablet Take 1 tablet (4 mg total) by mouth every 8 (eight) hours as needed for nausea or vomiting. 01/04/23  Yes Jemal Miskell A, PA-C  oxyCODONE-acetaminophen (PERCOCET/ROXICET) 5-325 MG tablet Take 1 tablet by mouth every 6 (six) hours as needed for severe pain. 01/04/23  Yes Latania Bascomb A, PA-C  ALPRAZolam (XANAX) 0.5 MG tablet TAKE 1 TABLET BY MOUTH 2 TIMES DAILY AS NEEDED 01/11/21   Judd Gaudier, NP  aspirin 81 MG EC tablet Take 81 mg by mouth daily.    [provider]  azelastine (ASTELIN) 0.1 % nasal spray Place 1 spray into both nostrils 2 (two) times daily. Use in each nostril as directed 12/02/22   Birder Robson, MD  benzonatate (TESSALON PERLES) 100 MG capsule Take 1 capsule (100 mg total) by mouth every 6 (six) hours as needed for cough. Patient not taking: Reported on 10/03/2022 09/10/22 09/10/23  Raynelle Dick, NP  cetirizine (ZYRTEC ALLERGY) 10 MG tablet Take 1 tablet (10 mg total) by mouth daily as needed for allergies. 12/02/22   Birder Robson, MD  CHELATED MAGNESIUM PO Take by mouth daily.    [provider]  cholecalciferol (VITAMIN D) 1000 UNITS tablet Take 1,000 Units by mouth daily.    [provider]  ezetimibe (ZETIA) 10 MG tablet Take 1 tablet (10 mg total) by mouth daily. 11/04/22   Raynelle Dick, NP  fluocinonide cream (LIDEX) 0.05 % Apply 1 application topically 2 (two) times daily.    [provider]  fluticasone (FLONASE) 50 MCG/ACT nasal spray Place 2 sprays into both nostrils at bedtime. 08/16/14   Couillard, Victorino Dike, PA-C  fluticasone (FLONASE) 50 MCG/ACT nasal spray Place 2 sprays into both nostrils daily. 12/02/22   Birder Robson, MD  ketoconazole (NIZORAL) 2 % cream Apply 1 application topically daily.    [provider]  meloxicam (MOBIC) 15 MG tablet Take 1 tab daily with food for 2-4 weeks then daily as needed. Do not take with ibuprofen or aleve. 01/22/22   Judd Gaudier, NP  Multiple Vitamin (MULTIVITAMIN) tablet Take 1 tablet by mouth daily.    [provider]  olmesartan (BENICAR) 40 MG tablet TAKE 1 TABLET DAILY FOR BLOOD PRESSURE 04/21/22   Raynelle Dick, NP  Omega-3 Fatty Acids (FISH OIL) 1200 MG CAPS Take 1 capsule by mouth daily.    [provider]  promethazine-dextromethorphan (PROMETHAZINE-DM) 6.25-15 MG/5ML syrup Take 5 mLs by mouth 4 (four) times daily as needed for cough. Patient not taking: Reported on 10/03/2022 09/10/22   Raynelle Dick, NP  Pseudoephedrine HCl (SUDAFED  PO) Take by mouth.    [provider]  Triprolidine-Pseudoephedrine (ANTIHISTAMINE PO) Take by mouth.    [provider]  valACYclovir (VALTREX) 500 MG tablet Take     1 tablet     Daily  -  prophylaxis for Cold Sores / Fever Blisters Patient taking differently: as needed. Take     1 tablet     Daily  -  prophylaxis for Cold Sores / Fever Blisters 09/06/20   Lucky Cowboy, MD  venlafaxine XR (EFFEXOR-XR) 37.5 MG 24 hr capsule Take 37.5 mg by mouth daily. For hot flashes.    [provider]       Allergies    Penicillins and Pravastatin    Review of Systems   Review of Systems  Gastrointestinal:  Positive for abdominal pain.  All other systems reviewed and are negative.   Physical Exam Updated Vital Signs BP (!) 150/97 (BP Location: Right Arm)   Pulse (!) 101   Temp 98.4 F (36.9 C) (Oral)   Resp 18   Ht  (1.702 m)   Wt 83.9 kg   LMP 09/05/2012   SpO2 98%   BMI 28.98 kg/m  Physical Exam Vitals and nursing note reviewed.  Constitutional:      General: She is not in acute distress.    Appearance: She is not diaphoretic.  HENT:     Head: Normocephalic and atraumatic.     Mouth/Throat:     Pharynx: No oropharyngeal exudate.  Eyes:     General: No scleral icterus.    Conjunctiva/sclera: Conjunctivae normal.  Cardiovascular:     Rate and Rhythm: Normal rate and regular rhythm.     Pulses: Normal pulses.     Heart sounds: Normal heart sounds.  Pulmonary:     Effort: Pulmonary effort is normal. No respiratory distress.     Breath sounds: Normal breath sounds. No wheezing.  Abdominal:     General: Bowel sounds are normal.     Palpations: Abdomen is soft. There is no mass.     Tenderness: There is generalized abdominal tenderness. There is no guarding or rebound.     Comments: Diffuse abdominal tenderness to palpation  Musculoskeletal:        General: Normal range of motion.     Cervical back: Normal range of motion and neck supple.  Skin:    General: Skin is warm and dry.  Neurological:     Mental Status: She is alert.  Psychiatric:        Behavior: Behavior normal.     ED Results / Procedures / Treatments   Labs (all labs ordered are listed, but only abnormal results are displayed) Labs Reviewed  COMPREHENSIVE METABOLIC PANEL - Abnormal; Notable for the following components:      Result Value   Glucose, Bld 145 (*)    All other components within normal limits  CBC - Abnormal; Notable for the following components:   WBC 12.2 (*)    All other  components within normal limits  LIPASE, BLOOD  URINALYSIS, ROUTINE W REFLEX MICROSCOPIC    EKG EKG Interpretation  Date/Time:  Sunday January 04 2023 13:00:25 EDT Ventricular Rate:  96 PR Interval:  216 QRS Duration: 103 QT Interval:  366 QTC Calculation: 463 R Axis:   -31 Text Interpretation: Sinus rhythm new  Prolonged PR interval Probable left atrial enlargement Left axis deviation Low voltage, precordial leads Consider anterior infarct Confirmed by Gwyneth Sprout ([id number]) on 01/04/2023 1:16:29 PM  Radiology CT ABDOMEN PELVIS W CONTRAST  Result Date: 01/04/2023 CLINICAL DATA:  Abdominal pain with nausea since last evening. EXAM: CT ABDOMEN AND PELVIS WITH CONTRAST TECHNIQUE: Multidetector CT imaging of the abdomen and pelvis was performed using the standard protocol following bolus administration of intravenous contrast. RADIATION DOSE REDUCTION: This exam was performed according to the departmental dose-optimization program which includes automated exposure control, adjustment of the mA and/or kV according to patient size and/or use of iterative reconstruction technique. CONTRAST:  80mL OMNIPAQUE IOHEXOL 300 MG/ML  SOLN COMPARISON:  None Available. FINDINGS: Lower chest: No acute abnormality. Hepatobiliary: No focal liver abnormality. Multiple stones are identified within the gallbladder. The largest stone is in the gallbladder neck measuring 1.5 cm. By CT, there is no gallbladder wall edema or pericholecystic inflammation/fluid. No intrahepatic bile duct dilatation. The common bile duct is normal in caliber. Pancreas: Unremarkable. No pancreatic ductal dilatation or surrounding inflammatory changes. Spleen: Normal in size without focal abnormality. Adrenals/Urinary Tract: Normal adrenal glands. No nephrolithiasis, hydronephrosis or suspicious mass identified bilaterally. Urinary bladder appears normal. Stomach/Bowel: Stomach is within normal limits. The appendix is visualized and appears  within normal limits. No pathologic dilatation of the large or small bowel loops. No wall thickening or inflammation. Vascular/Lymphatic: Normal appearance of the abdominal aorta. Upper abdominal vascularity appears patent. No signs of abdominopelvic adenopathy. Reproductive: Status post hysterectomy. No adnexal masses. Other: No free fluid or fluid collections. No signs of pneumoperitoneum. No abdominal wall hernia identified. Musculoskeletal: No acute or significant osseous findings. Degenerative disc disease identified at L5-S1. IMPRESSION: 1. No acute findings within the abdomen or pelvis. 2. Gallstones. Electronically Signed   By: Signa Kell M.D.   On: 01/04/2023 14:15    Procedures Procedures    Medications Ordered in ED Medications  sodium chloride 0.9 % bolus 1,000 mL (0 mLs Intravenous Stopped 01/04/23 1542)  ondansetron (ZOFRAN) injection 4 mg (4 mg Intravenous Given 01/04/23 1329)  iohexol (OMNIPAQUE) 300 MG/ML solution 100 mL (80 mLs Intravenous Contrast Given 01/04/23 1350)  oxyCODONE-acetaminophen (PERCOCET/ROXICET) 5-325 MG per tablet 1 tablet (1 tablet Oral Given 01/04/23 1440)    ED Course/ Medical Decision Making/ A&P Clinical Course as of 01/04/23 1612  Sun Jan 04, 2023  1424 Discussed with patient lab and imaging findings.  Patient notes improvement of her nausea. [SB]  1511 Pt re-evaluated and noted improvement of symptoms with treatment regimen in the ED. discussed with patient and family discharge treatment plan.  Answered all vital questions.  Patient appears safe discharge at this time. [SB]  1529 Stable 62 YOF with a chief complaint of abdominal pain found to have gallstones. Symptomatic cholelithiasis referred to OP GS. [CC]    Clinical Course User Index [CC] Glyn Ade, MD [SB] Kimaya Whitlatch A, PA-C                             Medical Decision Making Amount and/or Complexity of Data Reviewed Labs: ordered. Radiology: ordered.  Risk Prescription drug  management.   Patient presents to the emergency department with abdominal pain and nausea onset last night.  Denies sick contacts.  Patient afebrile.  On exam patient with diffuse abdominal tenderness to palpation.  No focal abdominal pain.  Remainder of exam without acute findings. Differential diagnosis includes pancreatitis, cholecystitis, appendicitis, diverticulitis, acute cystitis, nephrolithiasis, pyelonephritis.  Labs:  I ordered, and personally interpreted labs.  The pertinent results include:  Lipase unremarkable CBC with leukocytosis at 12.2 otherwise  unremarkable CMP unremarkable  Imaging: I ordered imaging studies including CT abdomen pelvis with I independently visualized and interpreted imaging which showed  1. No acute findings within the abdomen or pelvis.  2. Gallstones.   I agree with the radiologist interpretation  Medications:  I ordered medication including IV fluids, Percocet, Zofran for symptom management.  Reevaluation of the patient after these medicines and interventions, I reevaluated the patient and found that they have improved I have reviewed the patients home medicines and have made adjustments as needed    Disposition: Presenting suspicious for gallstones, likely symptomatic cholelithiasis.  Doubt concerns at this time for cholecystitis, acute cystitis, diverticulitis, appendicitis, pyelonephritis, nephrolithiasis, pancreatitis. After consideration of the diagnostic results and the patients response to treatment, I feel that the patient would benefit from Discharge home.  PDMP reviewed.  Short course of Percocet sent for breakthrough pain.  Patient sent with a prescription for Zofran.  Patient given information for on-call surgery group to call and set up a follow-up appointment regarding today's ED visit. Supportive care measures and strict return precautions discussed with patient at bedside. Pt acknowledges and verbalizes understanding. Pt appears safe  for discharge. Follow up as indicated in discharge paperwork.   This chart was dictated using voice recognition software, Dragon. Despite the best efforts of this provider to proofread and correct errors, errors may still occur which can change documentation meaning.   Final Clinical Impression(s) / ED Diagnoses Final diagnoses:  Calculus of gallbladder without cholecystitis without obstruction  Symptomatic cholelithiasis    Rx / DC Orders ED Discharge Orders          Ordered    oxyCODONE-acetaminophen (PERCOCET/ROXICET) 5-325 MG tablet  Every 6 hours PRN        01/04/23 1534    ondansetron (ZOFRAN) 4 MG tablet  Every 8 hours PRN        01/04/23 1544              Ward Boissonneault A, PA-C 01/04/23 1612    Gwyneth Sprout, MD 01/07/23 1038

## 2023-01-04 NOTE — Discharge Instructions (Addendum)
It was a pleasure taking care of you today!   Your labs showed slightly elevated white blood cell count. Your CT scan showed concern for gallstones.  You will be sent a prescription for Percocet to take as needed for breakthrough pain.  It is important that you take over-the-counter MiraLAX while taking this medication as this medication can cause constipation. You will be sent a prescription for zofran to take as needed for nausea/vomiting. If you have to take the zofran, wait at least 30 minutes before you attempt small sips/bites of food/fluid. Ensure to maintain fluid intake.  Attached is information for the on-call surgery group, call tomorrow to set up a follow-up appointment regarding today's ED visit.  Return to the emergency department if you experience increasing/worsening abdominal pain, vomiting, fever, worsening symptoms.

## 2023-01-04 NOTE — ED Notes (Signed)
Pt is aware urine sample is needed. 

## 2023-01-04 NOTE — ED Notes (Signed)
RN reviewed discharge instructions with pt. Pt verbalized understanding and had no further questions. VSS upon discharge.  

## 2023-01-04 NOTE — ED Triage Notes (Signed)
Patient here POV from Home.  Endorses ABD Pain that began at 2300 yesterday. Associated with Nausea. No Diarrhea.   Possible Syncopal Episode Yesterday early this AM while dry heaving.   NAD Noted during Triage. A&Ox4. Gcs 15. Ambulatory

## 2023-01-05 ENCOUNTER — Encounter: Payer: Self-pay | Admitting: Nurse Practitioner

## 2023-01-07 DIAGNOSIS — K8020 Calculus of gallbladder without cholecystitis without obstruction: Secondary | ICD-10-CM | POA: Diagnosis not present

## 2023-01-09 NOTE — Progress Notes (Signed)
Sent message, via epic in basket, requesting orders in epic from surgeon.  

## 2023-01-13 ENCOUNTER — Ambulatory Visit: Payer: Self-pay | Admitting: General Surgery

## 2023-01-13 DIAGNOSIS — K8020 Calculus of gallbladder without cholecystitis without obstruction: Secondary | ICD-10-CM

## 2023-01-14 ENCOUNTER — Ambulatory Visit (INDEPENDENT_AMBULATORY_CARE_PROVIDER_SITE_OTHER): Payer: BC Managed Care – PPO | Admitting: Nurse Practitioner

## 2023-01-14 ENCOUNTER — Encounter: Payer: Self-pay | Admitting: Nurse Practitioner

## 2023-01-14 VITALS — BP 148/92 | HR 73 | Temp 97.8°F | Ht 66.5 in | Wt 189.6 lb

## 2023-01-14 DIAGNOSIS — E559 Vitamin D deficiency, unspecified: Secondary | ICD-10-CM | POA: Diagnosis not present

## 2023-01-14 DIAGNOSIS — M7661 Achilles tendinitis, right leg: Secondary | ICD-10-CM

## 2023-01-14 DIAGNOSIS — G4733 Obstructive sleep apnea (adult) (pediatric): Secondary | ICD-10-CM

## 2023-01-14 DIAGNOSIS — Z79899 Other long term (current) drug therapy: Secondary | ICD-10-CM

## 2023-01-14 DIAGNOSIS — R7303 Prediabetes: Secondary | ICD-10-CM

## 2023-01-14 DIAGNOSIS — I10 Essential (primary) hypertension: Secondary | ICD-10-CM | POA: Diagnosis not present

## 2023-01-14 DIAGNOSIS — Z85828 Personal history of other malignant neoplasm of skin: Secondary | ICD-10-CM

## 2023-01-14 DIAGNOSIS — G43809 Other migraine, not intractable, without status migrainosus: Secondary | ICD-10-CM

## 2023-01-14 DIAGNOSIS — K8011 Calculus of gallbladder with chronic cholecystitis with obstruction: Secondary | ICD-10-CM

## 2023-01-14 DIAGNOSIS — F419 Anxiety disorder, unspecified: Secondary | ICD-10-CM

## 2023-01-14 DIAGNOSIS — E785 Hyperlipidemia, unspecified: Secondary | ICD-10-CM

## 2023-01-14 DIAGNOSIS — E669 Obesity, unspecified: Secondary | ICD-10-CM

## 2023-01-14 DIAGNOSIS — M5030 Other cervical disc degeneration, unspecified cervical region: Secondary | ICD-10-CM

## 2023-01-14 DIAGNOSIS — M7662 Achilles tendinitis, left leg: Secondary | ICD-10-CM

## 2023-01-14 LAB — CBC WITH DIFFERENTIAL/PLATELET
Basophils Relative: 1 %
Eosinophils Absolute: 211 cells/uL (ref 15–500)
MCH: 29.4 pg (ref 27.0–33.0)
Neutrophils Relative %: 62.8 %
WBC: 6.2 10*3/uL (ref 3.8–10.8)

## 2023-01-14 NOTE — Progress Notes (Signed)
Complete Physical  Assessment and Plan:  Essential hypertension Discussed DASH (Dietary Approaches to Stop Hypertension) DASH diet is lower in sodium than a typical American diet. Cut back on foods that are high in saturated fat, cholesterol, and trans fats. Eat more whole-grain foods, fish, poultry, and nuts Remain active and exercise as tolerated daily.  Monitor BP at home-Call if greater than 130/80.  Check and monitor CMP/CBC  Hyperlipidemia Discussed lifestyle modifications. Recommended diet heavy in fruits and veggies, omega 3's. Decrease consumption of animal meats, cheeses, and dairy products. Remain active and exercise as tolerated. Continue to monitor. Check and monitor lipids/TSH  Prediabetes Education: Reviewed 'ABCs' of diabetes management  Discussed goals to be met and/or maintained include A1C (<7) Blood pressure (<130/80) Cholesterol (LDL <70) Continue Eye Exam yearly  Continue Dental Exam Q6 mo Discussed dietary recommendations Discussed Physical Activity recommendations Check and monitor A1C  Medication management All medications discussed and reviewed in full. All questions and concerns regarding medications addressed.    Vitamin D deficiency Continue supplement Monitor levels  DDD (degenerative disc disease), cervical Controlled Meloxicam PRN Continue to monitor  Hx of basal cell carcinoma of nose Derm following annually  Apt 07/2022  Other migraine without status migrainosus, not intractable Resolved  Obesity - BMI 30  Discussed appropriate BMI Diet modification. Physical activity. Encouraged/praised to build confidence.  OSA OSA on CPAP  Follows Neurology - last seen 01/2022 Consulted 09/2021 for snoring and fatigue.   HST 11/06/2021 showed moderately severel OSA. Denies other concerning sx of OSA  Anxiety Continue Effexor - also uses for hot flashes Reviewed relaxation techniques.  Sleep hygiene. Recommended mindfulness  meditation and exercise.   Psychoeducation:  encouraged personality growth wand development through coping techniques and problem-solving skills. Limit/Decrease/Monitor drug/alcohol intake.    Bilateral achilles tendonitis Currently controlled. Continue to monitor  Cholelithiasis Continue with cholecystectomy scheduled for 01/19/23  Orders Placed This Encounter  Procedures   CBC with Differential/Platelet   COMPLETE METABOLIC PANEL WITH GFR   Lipid panel   Hemoglobin A1c   VITAMIN D 25 Hydroxy (Vit-D Deficiency, Fractures)    Notify office for further evaluation and treatment, questions or concerns if any reported s/s fail to improve.   The patient was advised to call back or seek an in-person evaluation if any symptoms worsen or if the condition fails to improve as anticipated.   Further disposition pending results of labs. Discussed med's effects and SE's.    I discussed the assessment and treatment plan with the patient. The patient was provided an opportunity to ask questions and all were answered. The patient agreed with the plan and demonstrated an understanding of the instructions.  Discussed med's effects and SE's. Screening labs and tests as requested with regular follow-up as recommended.  I provided 25 minutes of face-to-face time during this encounter including counseling, chart review, and critical decision making was preformed.  Future Appointments  Date Time Provider Department Center  01/16/2023  9:00 AM [facility] PAT 4 [facility] None  02/11/2023  1:00 PM Lomax, Amy, NP [facility] None  07/20/2023  9:00 AM Treyce Spillers, Archie Patten, NP [facility] None    HPI  62 y.o. female  presents for a general follow up. She has Hyperlipidemia; Essential hypertension; Other abnormal glucose; Medication management; Vitamin D deficiency; DDD (degenerative disc disease), cervical; Overweight (BMI 25.0-29.9); Anxiety; History of basal cell carcinoma (BCC); History of adenomatous polyp of colon;  Achilles tendinitis of both lower extremities; Acid reflux; Major depressive disorder, single episode, in remission; AV  block, 1st degree; Retrognathia; Moderate obstructive sleep apnea-hypopnea syndrome; and Medial epicondylitis of right elbow on their problem list.  She is married, 3 grown kids, 2 daughters and a son, 3 grandchildren in [place].   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  Her mom had lung cancer and passed away in 01-21-2019. She is still trying to handle her mother's estate.  Mood has become more stable.  She is no longer   She has been following up with Dr Andrey Campanile for cholecystitis.  Plant to have cholecystectomy 01/19/23.taking Lexapro. She is continuing Effexor for hot flashes.    Saw allergy and asthma 10/03/22 for perennial allergic rhinitis  allergic conjunctivitis of both eyes.  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  She was instructed to continue nasal saline rinses before nose sprays such as with Neilmed Flonase, Azelastine and  Zyrtec.  Follows Dr[facility] Juliene Pina at [facility] annually. Hx of hysterectomy sparing ovaries.   She follows with derm annually, due to history of BCC to nose.  Had a recent ares on the chest that was removed - no concern for malignancy.  She has hx of snoring, obesity.  Saw [facility] 09/2021 for HST which revealed moderate OSA.  Has CPAP.  She continues to use nightly and reports feeling rested.  BMI is Body mass index is 30.14 kg/m., she has been working on diet and exercise.  Has completed PT for achilles tendonitis. Wt Readings from Last 3 Encounters:  01/14/23 189 lb 9.6 oz (86 kg)  01/04/23 185 lb (83.9 kg)  12/02/22 188 lb 1.6 oz (85.3 kg)   Today their BP is BP: (!) 148/92 and well controlled.  She denies chest pain, shortness of breath, dizziness.   She is not on cholesterol medication and denies myalgias. Her cholesterol is not at goal. The cholesterol last visit was:   Lab Results  Component Value Date   CHOL 172  07/16/2022   HDL 44 (L) 07/16/2022   LDLCALC 106 (H) 07/16/2022   TRIG 127 07/16/2022   CHOLHDL 3.9 07/16/2022   She has been working on diet and exercise for prediabetes, and denies paresthesia of the feet, polydipsia, polyuria and visual disturbances. Last A1C in the office was:  Lab Results  Component Value Date   HGBA1C 5.9 (H) 07/16/2022   Patient is on Vitamin D supplement.   Lab Results  Component Value Date   VD25OH 01/21/2060 07/16/2022       Current Medications:  Current Outpatient Medications on File Prior to Visit  Medication Sig Dispense Refill   ALPRAZolam (XANAX) 0.5 MG tablet TAKE 1 TABLET BY MOUTH 2 TIMES DAILY AS NEEDED (Patient taking differently: Take 0.5 mg by mouth daily as needed for anxiety or sleep.) 60 tablet 0   aspirin 81 MG EC tablet Take 81 mg by mouth daily.     azelastine (ASTELIN) 0.1 % nasal spray Place 1 spray into both nostrils 2 (two) times daily. Use in each nostril as directed (Patient taking differently: Place 1 spray into both nostrils 2 (two) times daily as needed for allergies. Use in each nostril as directed) 30 mL 11   benzonatate (TESSALON PERLES) 100 MG capsule Take 1 capsule (100 mg total) by mouth every 6 (six) hours as needed for cough. 30 capsule 1   cetirizine (ZYRTEC ALLERGY) 10 MG tablet Take 1 tablet (10 mg total) by mouth daily as needed for allergies. (Patient taking differently: Take 10 mg by mouth daily.) 30 tablet 11  CHELATED MAGNESIUM PO Take 72 mg by mouth daily.     cholecalciferol (VITAMIN D) 1000 UNITS tablet Take 1,000 Units by mouth daily.     ezetimibe (ZETIA) 10 MG tablet Take 1 tablet (10 mg total) by mouth daily. 90 tablet 3   fluocinonide cream (LIDEX) 0.05 % Apply 1 application  topically daily as needed (itching).     fluticasone (FLONASE) 50 MCG/ACT nasal spray Place 2 sprays into both nostrils daily. 16 g 11   ketoconazole (NIZORAL) 2 % cream Apply 1 application  topically daily as needed for irritation.      meloxicam (MOBIC) 15 MG tablet Take 1 tab daily with food for 2-4 weeks then daily as needed. Do not take with ibuprofen or aleve. 30 tablet 1   Multiple Vitamin (MULTIVITAMIN) tablet Take 1 tablet by mouth daily.     olmesartan (BENICAR) 40 MG tablet TAKE 1 TABLET DAILY FOR BLOOD PRESSURE (Patient taking differently: Take 20 mg by mouth daily.) 90 tablet 3   Omega 3-6-9 Fatty Acids (OMEGA 3-6-9 PO) Take 3,600 mg by mouth daily.     ondansetron (ZOFRAN) 4 MG tablet Take 1 tablet (4 mg total) by mouth every 8 (eight) hours as needed for nausea or vomiting. 12 tablet 0   oxyCODONE-acetaminophen (PERCOCET/ROXICET) 5-325 MG tablet Take 1 tablet by mouth every 6 (six) hours as needed for severe pain. 10 tablet 0   promethazine-dextromethorphan (PROMETHAZINE-DM) 6.25-15 MG/5ML syrup Take 5 mLs by mouth 4 (four) times daily as needed for cough. 240 mL 1   Pseudoephedrine HCl (SUDAFED PO) Take 1 tablet by mouth daily as needed (Sinus).     valACYclovir (VALTREX) 500 MG tablet Take     1 tablet     Daily  -  prophylaxis for Cold Sores / Fever Blisters (Patient taking differently: Take 500 mg by mouth as needed (prophylaxis for Cold Sores / Fever Blisters).) 90 tablet 0   venlafaxine XR (EFFEXOR-XR) 37.5 MG 24 hr capsule Take 37.5 mg by mouth daily. For hot flashes.     No current facility-administered medications on file prior to visit.   Health Maintenance:   Immunization History  Administered Date(s) Administered   Influenza Inj Mdck Quad Pf 07/02/2021   Influenza Inj Mdck Quad With Preservative 07/09/2020   Influenza-Unspecified 06/22/2013, 06/22/2017, 07/12/2018, 07/04/2019, 06/17/2022   PFIZER(Purple Top)SARS-COV-2 Vaccination 10/12/2019, 11/02/2019, 06/26/2020, 06/04/2021   Pfizer Covid-19 Vaccine Bivalent Booster 62yrs & up 03/28/2022, 06/24/2022   Tdap 09/22/2006, 08/10/2017   Zoster Recombinat (Shingrix) 06/22/2017, 11/04/2017    Patient Care Team: Lucky Cowboy, MD as PCP - General  (Internal Medicine) Salvatore Marvel, MD as Consulting Physician (Orthopedic Surgery) Mateo Flow, MD as Consulting Physician (Ophthalmology) Pricilla Riffle, MD as Consulting Physician (Cardiology) Louis Meckel, MD (Inactive) as Consulting Physician (Gastroenterology) Shea Evans, MD as Consulting Physician (Obstetrics and Gynecology) Hilda Lias, MD as Consulting Physician (Neurosurgery) Cherlyn Roberts, MD as Consulting Physician (Dermatology)  Medical History:  Past Medical History:  Diagnosis Date   Achilles tendinitis    Acid reflux 07/09/2020   Anxiety    Basal cell carcinoma of nose 05/23/2015   Endometrial hyperplasia without atypia, simple 09/10/2012   Endometrial hyperplasia without atypia, simple 09/10/2012   HSV-1 infection    HTN (hypertension)    Hyperlipidemia    Migraine    PONV (postoperative nausea and vomiting)    Recurrent upper respiratory infection (URI)    Urticaria    Allergies Allergies  Allergen Reactions   Penicillins  REACTION: hives   Pravastatin Other (See Comments)    myalgias    SURGICAL HISTORY She  has a past surgical history that includes Tubal ligation (1994); acl replacement; Cervical disc surgery (2012); Robotic assisted total hysterectomy (09/10/2012); Bilateral salpingectomy (09/10/2012); De Quervian's release (Bilateral, 2019); Colonoscopy (2011); and Achilles tendon surgery (Right, 10/19/2020). FAMILY HISTORY Her family history includes Alzheimer's disease in her father; Asthma in her daughter; Breast cancer in her mother; CVA (age of onset: 80) in her brother; Cancer in her maternal grandfather; Diabetes in her brother; Heart disease in her brother; Hyperlipidemia in her brother; Hypertension in her brother, brother, and mother; Lung cancer in her mother; Stroke in her mother. SOCIAL HISTORY She  reports that she has never smoked. She has been exposed to tobacco smoke. She has never used smokeless tobacco. She reports  current alcohol use. She reports that she does not use drugs.  Review of Systems:  Review of Systems  Constitutional: Negative.  Negative for malaise/fatigue and weight loss.  HENT:  Negative for congestion, ear discharge, ear pain, hearing loss, nosebleeds, sore throat and tinnitus.   Eyes: Negative.  Negative for blurred vision and double vision.  Respiratory: Negative.  Negative for cough, shortness of breath, wheezing and stridor.   Cardiovascular: Negative.  Negative for chest pain, palpitations, orthopnea, claudication and leg swelling.  Gastrointestinal: Negative.  Negative for abdominal pain, blood in stool, constipation, diarrhea, heartburn, melena, nausea and vomiting.  Genitourinary: Negative.   Musculoskeletal:  Negative for back pain, falls, joint pain, myalgias and neck pain.  Skin: Negative.  Negative for rash.  Neurological:  Negative for dizziness, tingling, tremors, sensory change, speech change, focal weakness, seizures, loss of consciousness, weakness and headaches.  Endo/Heme/Allergies: Negative.  Negative for polydipsia.  Psychiatric/Behavioral:  Negative for depression, hallucinations, memory loss, substance abuse and suicidal ideas. The patient is not nervous/anxious and does not have insomnia.   All other systems reviewed and are negative.   Physical Exam: Estimated body mass index is 30.14 kg/m as calculated from the following:   Height as of this encounter: 5' 6.5" (1.689 m).   Weight as of this encounter: 189 lb 9.6 oz (86 kg). BP (!) 148/92   Pulse 73   Temp 97.8 F (36.6 C)   Ht 5' 6.5" (1.689 m)   Wt 189 lb 9.6 oz (86 kg)   LMP 09/05/2012   SpO2 98%   BMI 30.14 kg/m   Wt Readings from Last 3 Encounters:  01/14/23 189 lb 9.6 oz (86 kg)  01/04/23 185 lb (83.9 kg)  12/02/22 188 lb 1.6 oz (85.3 kg)   General Appearance: Well nourished, in no apparent distress.  Eyes: PERRLA, EOMs, conjunctiva no swelling or erythema Sinuses: No Frontal/maxillary  tenderness  ENT/Mouth: Ext aud canals clear, normal light reflex with TMs without erythema, bulging. Good dentition. No erythema, swelling, or exudate on post pharynx. Tonsils not swollen or erythematous. Hearing normal.  Neck: Supple, thyroid normal. No bruits  Respiratory: Respiratory effort normal, BS equal bilaterally without rales, rhonchi, wheezing or stridor.  Cardio: RRR without murmurs, rubs or gallops. Brisk peripheral pulses without edema.  Chest: Symmetric, with normal excursions and percussion.  Breasts: defer to GYN Abdomen: Nontender.  BS x4 present  Lymphatics: Non tender without lymphadenopathy.  Genitourinary: defer to GYN Musculoskeletal: Full ROM all peripheral extremities,5/5 strength, and mildly antalgic gait. She has bilateral achilles tendon tenderness and mild swelling/boggy without erythema or heat.  Skin: Warm.  WNL.   Neuro: Cranial nerves  intact, reflexes equal bilaterally. Normal muscle tone, no cerebellar symptoms. Sensation intact.  Psych: Awake and oriented X 3, normal affect, Insight and Judgment appropriate.    Dhanvin Szeto 2:00 PM [facility]

## 2023-01-14 NOTE — Patient Instructions (Signed)
DUE TO COVID-19 ONLY TWO VISITORS  (aged 63 and older)  ARE ALLOWED TO COME WITH YOU AND STAY IN THE WAITING ROOM ONLY DURING PRE OP AND PROCEDURE.   **NO VISITORS ARE ALLOWED IN THE SHORT STAY AREA OR RECOVERY ROOM!!**  IF YOU WILL BE ADMITTED INTO THE HOSPITAL YOU ARE ALLOWED ONLY FOUR SUPPORT PEOPLE DURING VISITATION HOURS ONLY (7 AM -8PM)   The support person(s) must pass our screening, gel in and out, and wear a mask at all times, including in the patient's room. Patients must also wear a mask when staff or their support person are in the room. Visitors GUEST BADGE MUST BE WORN VISIBLY  One adult visitor may remain with you overnight and MUST be in the room by 8 P.M.     Your procedure is scheduled on: 01/19/23   Report to [facility] Main Entrance    Report to admitting at : 11:45 AM   Call this number if you have problems the morning of surgery [phone]  Eat a light diet the day before surgery.  Examples including soups, broths, toast, yogurt, mashed potatoes.  Things to avoid include carbonated beverages (fizzy beverages), raw fruits and raw vegetables, or beans.   If your bowels are filled with gas, your surgeon will have difficulty visualizing your pelvic organs which increases your surgical risks.  D o not eat food :After Midnight.   After Midnight you may have the following liquids until : 11:00 AM DAY OF SURGERY  Water Black Coffee (sugar ok, NO MILK/CREAM OR CREAMERS)  Tea (sugar ok, NO MILK/CREAM OR CREAMERS) regular and decaf                             Plain Jell-O (NO RED)                                           Fruit ices (not with fruit pulp, NO RED)                                     Popsicles (NO RED)                                                                  Juice: apple, WHITE grape, WHITE cranberry Sports drinks like Gatorade (NO RED)                Oral Hygiene is also important to reduce your risk of infection.                                     Remember - BRUSH YOUR TEETH THE MORNING OF SURGERY WITH YOUR REGULAR TOOTHPASTE  DENTURES WILL BE REMOVED PRIOR TO SURGERY PLEASE DO NOT APPLY "Poly grip" OR ADHESIVES!!!   Do NOT smoke after Midnight   Take these medicines the morning of surgery with A SIP OF WATER:venlafaxine,cetirizine.Alprazolam,oxycodone as needed.  You may not have any metal on your body including hair pins, jewelry, and body piercing             Do not wear make-up, lotions, powders, perfumes/cologne, or deodorant  Do not wear nail polish including gel and S&S, artificial/acrylic nails, or any other type of covering on natural nails including finger and toenails. If you have artificial nails, gel coating, etc. that needs to be removed by a nail salon please have this removed prior to surgery or surgery may need to be canceled/ delayed if the surgeon/ anesthesia feels like they are unable to be safely monitored.   Do not shave  48 hours prior to surgery.    Do not bring valuables to the hospital. [place].   Contacts, glasses, or bridgework may not be worn into surgery.   Bring small overnight bag day of surgery.   DO NOT [place]achute. PHARMACY WILL DISPENSE MEDICATIONS LISTED ON YOUR MEDICATION LIST TO YOU DURING YOUR ADMISSION [place]!    Patients discharged on the day of surgery will not be allowed to drive home.  Someone NEEDS to stay with you for the first 24 hours after anesthesia.   Special Instructions: Bring a copy of your healthcare power of attorney and living will documents         the day of surgery if you haven't scanned them before.              Please read over the following fact sheets you were given: IF YOU HAVE QUESTIONS ABOUT YOUR PRE-OP INSTRUCTIONS PLEASE CALL [phone]    [facility] - Preparing for Surgery Before surgery, you can play an important role.   Because skin is not sterile, your skin needs to be as free of germs as possible.  You can reduce the number of germs on your skin by washing with CHG (chlorahexidine gluconate) soap before surgery.  CHG is an antiseptic cleaner which kills germs and bonds with the skin to continue killing germs even after washing. Please DO NOT use if you have an allergy to CHG or antibacterial soaps.  If your skin becomes reddened/irritated stop using the CHG and inform your nurse when you arrive at Short Stay. Do not shave (including legs and underarms) for at least 48 hours prior to the first CHG shower.  You may shave your face/neck. Please follow these instructions carefully:  1.  Shower with CHG Soap the night before surgery and the  morning of Surgery.  2.  If you choose to wash your hair, wash your hair first as usual with your  normal  shampoo.  3.  After you shampoo, rinse your hair and body thoroughly to remove the  shampoo.                           4.  Use CHG as you would any other liquid soap.  You can apply chg directly  to the skin and wash                       Gently with a scrungie or clean washcloth.  5.  Apply the CHG Soap to your body ONLY FROM THE NECK DOWN.   Do not use on face/ open  Wound or open sores. Avoid contact with eyes, ears mouth and genitals (private parts).                       Wash face,  Genitals (private parts) with your normal soap.             6.  Wash thoroughly, paying special attention to the area where your surgery  will be performed.  7.  Thoroughly rinse your body with warm water from the neck down.  8.  DO NOT shower/wash with your normal soap after using and rinsing off  the CHG Soap.                9.  Pat yourself dry with a clean towel.            10.  Wear clean pajamas.            11.  Place clean sheets on your bed the night of your first shower and do not  sleep with pets. Day of Surgery : Do not apply any lotions/deodorants the  morning of surgery.  Please wear clean clothes to the hospital/surgery center.  FAILURE TO FOLLOW THESE INSTRUCTIONS MAY RESULT IN THE CANCELLATION OF YOUR SURGERY PATIENT SIGNATURE_________________________________  NURSE SIGNATURE__________________________________  ________________________________________________________________________

## 2023-01-14 NOTE — Patient Instructions (Signed)

## 2023-01-15 LAB — LIPID PANEL
Cholesterol: 173 mg/dL (ref <200)
HDL: 39 mg/dL — ABNORMAL LOW (ref >=50)
LDL Cholesterol (Calc): 102 mg/dL (calc) — ABNORMAL HIGH
Non-HDL Cholesterol (Calc): 134 mg/dL (calc) — ABNORMAL HIGH (ref <130)
Total CHOL/HDL Ratio: 4.4 (calc) (ref <5.0)
Triglycerides: 204 mg/dL — ABNORMAL HIGH (ref <150)

## 2023-01-15 LAB — COMPLETE METABOLIC PANEL WITH GFR
AG Ratio: 1.7 (calc) (ref 1.0–2.5)
ALT: 23 U/L (ref 6–29)
AST: 21 U/L (ref 10–35)
Albumin: 4.5 g/dL (ref 3.6–5.1)
Alkaline phosphatase (APISO): 95 U/L (ref 37–153)
BUN: 16 mg/dL (ref 7–25)
CO2: 29 mmol/L (ref 20–32)
Calcium: 10.2 mg/dL (ref 8.6–10.4)
Chloride: 104 mmol/L (ref 98–110)
Creat: 0.78 mg/dL (ref 0.50–1.05)
Globulin: 2.7 g/dL (calc) (ref 1.9–3.7)
Glucose, Bld: 80 mg/dL (ref 65–99)
Potassium: 4.4 mmol/L (ref 3.5–5.3)
Sodium: 143 mmol/L (ref 135–146)
Total Bilirubin: 0.3 mg/dL (ref 0.2–1.2)
Total Protein: 7.2 g/dL (ref 6.1–8.1)
eGFR: 85 mL/min/{1.73_m2} (ref >=60)

## 2023-01-15 LAB — CBC WITH DIFFERENTIAL/PLATELET
Absolute Monocytes: 378 cells/uL (ref 200–950)
Basophils Absolute: 62 cells/uL (ref 0–200)
Eosinophils Relative: 3.4 %
HCT: 39.6 % (ref 35.0–45.0)
Hemoglobin: 13.1 g/dL (ref 11.7–15.5)
Lymphs Abs: 1655 cells/uL (ref 850–3900)
MCHC: 33.1 g/dL (ref 32.0–36.0)
MCV: 89 fL (ref 80.0–100.0)
MPV: 10.5 fL (ref 7.5–12.5)
Monocytes Relative: 6.1 %
Neutro Abs: 3894 cells/uL (ref 1500–7800)
Platelets: 355 10*3/uL (ref 140–400)
RBC: 4.45 10*6/uL (ref 3.80–5.10)
RDW: 12.2 % (ref 11.0–15.0)
Total Lymphocyte: 26.7 %

## 2023-01-15 LAB — VITAMIN D 25 HYDROXY (VIT D DEFICIENCY, FRACTURES): Vit D, 25-Hydroxy: 58 ng/mL (ref 30–100)

## 2023-01-15 LAB — HEMOGLOBIN A1C
Hgb A1c MFr Bld: 6.2 % of total Hgb — ABNORMAL HIGH (ref <5.7)
Mean Plasma Glucose: 131 mg/dL
eAG (mmol/L): 7.3 mmol/L

## 2023-01-16 ENCOUNTER — Other Ambulatory Visit: Payer: Self-pay

## 2023-01-16 ENCOUNTER — Encounter (HOSPITAL_COMMUNITY)
Admission: RE | Admit: 2023-01-16 | Discharge: 2023-01-16 | Disposition: A | Discharge status: Home / Self Care | Payer: BC Managed Care – PPO | Source: Ambulatory Visit | Attending: General Surgery | Admitting: General Surgery

## 2023-01-16 ENCOUNTER — Encounter (HOSPITAL_COMMUNITY): Payer: Self-pay | Admitting: General Surgery

## 2023-01-16 ENCOUNTER — Encounter (HOSPITAL_COMMUNITY): Payer: Self-pay

## 2023-01-16 DIAGNOSIS — K8020 Calculus of gallbladder without cholecystitis without obstruction: Secondary | ICD-10-CM

## 2023-01-16 DIAGNOSIS — Z01818 Encounter for other preprocedural examination: Secondary | ICD-10-CM | POA: Insufficient documentation

## 2023-01-16 DIAGNOSIS — I10 Essential (primary) hypertension: Secondary | ICD-10-CM | POA: Diagnosis not present

## 2023-01-16 DIAGNOSIS — G4730 Sleep apnea, unspecified: Secondary | ICD-10-CM | POA: Diagnosis not present

## 2023-01-16 DIAGNOSIS — Z8249 Family history of ischemic heart disease and other diseases of the circulatory system: Secondary | ICD-10-CM | POA: Diagnosis not present

## 2023-01-16 DIAGNOSIS — K8012 Calculus of gallbladder with acute and chronic cholecystitis without obstruction: Secondary | ICD-10-CM | POA: Diagnosis not present

## 2023-01-16 HISTORY — DX: Anemia, unspecified: D64.9

## 2023-01-16 HISTORY — DX: Prediabetes: R73.03

## 2023-01-16 LAB — GLUCOSE, CAPILLARY: Glucose-Capillary: 124 mg/dL — ABNORMAL HIGH (ref 70–99)

## 2023-01-16 NOTE — Progress Notes (Addendum)
For Short Stay: COVID SWAB appointment date:  Bowel Prep reminder: N/A   For Anesthesia: PCP - Dr. Lucky Cowboy Cardiologist - N/A  Chest x-ray - 07/18/22 EKG - 01/04/23 Stress Test -  ECHO -  Cardiac Cath -  Pacemaker/ICD device last checked: Pacemaker orders received: Device Rep notified:  Spinal Cord Stimulator: N/A  Sleep Study - Yes CPAP - Yes  Fasting Blood Sugar - N/A Checks Blood Sugar ___0__ times a day Date and result of last Hgb A1c- 6.2: 01/14/23  Last dose of GLP1 agonist- N/A GLP1 instructions:   Last dose of SGLT-2 inhibitors-  SGLT-2 instructions:   Blood Thinner Instructions: Aspirin Instructions: Last Dose:  Activity level: Can go up a flight of stairs and activities of daily living without stopping and without chest pain and/or shortness of breath   Able to exercise without chest pain and/or shortness of breath  Anesthesia review: HTN,Pre-DIA,OSA(CPAP)  Patient denies shortness of breath, fever, cough and chest pain at PAT appointment   Patient verbalized understanding of instructions that were given to them at the PAT appointment. Patient was also instructed that they will need to review over the PAT instructions again at home before surgery.

## 2023-01-19 ENCOUNTER — Other Ambulatory Visit: Payer: Self-pay

## 2023-01-19 ENCOUNTER — Ambulatory Visit (HOSPITAL_COMMUNITY)
Admission: RE | Admit: 2023-01-19 | Discharge: 2023-01-19 | Disposition: A | Discharge status: Home / Self Care | Payer: BC Managed Care – PPO | Attending: General Surgery | Admitting: General Surgery

## 2023-01-19 ENCOUNTER — Encounter (HOSPITAL_COMMUNITY)
Admission: RE | Disposition: A | Discharge status: Home / Self Care | Payer: Self-pay | Source: Home / Self Care | Attending: General Surgery

## 2023-01-19 ENCOUNTER — Ambulatory Visit (HOSPITAL_COMMUNITY): Payer: BC Managed Care – PPO | Admitting: Anesthesiology

## 2023-01-19 ENCOUNTER — Encounter (HOSPITAL_COMMUNITY): Payer: Self-pay | Admitting: General Surgery

## 2023-01-19 DIAGNOSIS — Z8249 Family history of ischemic heart disease and other diseases of the circulatory system: Secondary | ICD-10-CM | POA: Diagnosis not present

## 2023-01-19 DIAGNOSIS — I10 Essential (primary) hypertension: Secondary | ICD-10-CM | POA: Diagnosis not present

## 2023-01-19 DIAGNOSIS — K8012 Calculus of gallbladder with acute and chronic cholecystitis without obstruction: Secondary | ICD-10-CM | POA: Diagnosis not present

## 2023-01-19 DIAGNOSIS — K8020 Calculus of gallbladder without cholecystitis without obstruction: Secondary | ICD-10-CM | POA: Diagnosis not present

## 2023-01-19 DIAGNOSIS — K8066 Calculus of gallbladder and bile duct with acute and chronic cholecystitis without obstruction: Secondary | ICD-10-CM | POA: Diagnosis not present

## 2023-01-19 DIAGNOSIS — G4730 Sleep apnea, unspecified: Secondary | ICD-10-CM | POA: Insufficient documentation

## 2023-01-19 HISTORY — PX: CHOLECYSTECTOMY: SHX55

## 2023-01-19 LAB — COMPREHENSIVE METABOLIC PANEL
ALT: 26 U/L (ref 0–44)
AST: 31 U/L (ref 15–41)
Albumin: 4 g/dL (ref 3.5–5.0)
Alkaline Phosphatase: 80 U/L (ref 38–126)
Anion gap: 11 (ref 5–15)
BUN: 13 mg/dL (ref 8–23)
CO2: 24 mmol/L (ref 22–32)
Calcium: 8.7 mg/dL — ABNORMAL LOW (ref 8.9–10.3)
Chloride: 104 mmol/L (ref 98–111)
Creatinine, Ser: 0.82 mg/dL (ref 0.44–1.00)
GFR, Estimated: >60 mL/min (ref >60)
Glucose, Bld: 129 mg/dL — ABNORMAL HIGH (ref 70–99)
Potassium: 3.9 mmol/L (ref 3.5–5.1)
Sodium: 139 mmol/L (ref 135–145)
Total Bilirubin: 0.5 mg/dL (ref 0.3–1.2)
Total Protein: 7 g/dL (ref 6.5–8.1)

## 2023-01-19 LAB — GLUCOSE, CAPILLARY: Glucose-Capillary: 106 mg/dL — ABNORMAL HIGH (ref 70–99)

## 2023-01-19 SURGERY — LAPAROSCOPIC CHOLECYSTECTOMY WITH INTRAOPERATIVE CHOLANGIOGRAM
Anesthesia: General

## 2023-01-19 MED ORDER — LIDOCAINE 2% (20 MG/ML) 5 ML SYRINGE
INTRAMUSCULAR | Status: DC | PRN
  Administered 2023-01-19 (×2): 50 mg via INTRAVENOUS

## 2023-01-19 MED ORDER — SUGAMMADEX SODIUM 200 MG/2ML IV SOLN
INTRAVENOUS | Status: DC | PRN
  Administered 2023-01-19: 200 mg via INTRAVENOUS

## 2023-01-19 MED ORDER — LACTATED RINGERS IV SOLN
INTRAVENOUS | Status: DC | PRN
  Administered 2023-01-19: 1000 mL

## 2023-01-19 MED ORDER — PROPOFOL 10 MG/ML IV BOLUS
INTRAVENOUS | Status: DC | PRN
  Administered 2023-01-19: 150 mg via INTRAVENOUS

## 2023-01-19 MED ORDER — MIDAZOLAM HCL 2 MG/2ML IJ SOLN
INTRAMUSCULAR | Status: AC
  Filled 2023-01-19: qty 2

## 2023-01-19 MED ORDER — ROCURONIUM BROMIDE 10 MG/ML (PF) SYRINGE
PREFILLED_SYRINGE | INTRAVENOUS | Status: DC | PRN
  Administered 2023-01-19: 50 mg via INTRAVENOUS

## 2023-01-19 MED ORDER — CHLORHEXIDINE GLUCONATE 0.12 % MT SOLN
15.0000 mL | Freq: Once | OROMUCOSAL | Status: AC
  Administered 2023-01-19: 15 mL via OROMUCOSAL

## 2023-01-19 MED ORDER — ACETAMINOPHEN 500 MG PO TABS
1000.0000 mg | ORAL_TABLET | ORAL | Status: AC
  Administered 2023-01-19: 1000 mg via ORAL
  Filled 2023-01-19: qty 2

## 2023-01-19 MED ORDER — CHLORHEXIDINE GLUCONATE CLOTH 2 % EX PADS: 6.0000 | MEDICATED_PAD | Freq: Once | CUTANEOUS | Status: DC

## 2023-01-19 MED ORDER — HEMOSTATIC AGENTS (NO CHARGE) OPTIME
TOPICAL | Status: DC | PRN
  Administered 2023-01-19: 1 via TOPICAL

## 2023-01-19 MED ORDER — FENTANYL CITRATE PF 50 MCG/ML IJ SOSY
PREFILLED_SYRINGE | INTRAMUSCULAR | Status: AC
  Filled 2023-01-19: qty 2

## 2023-01-19 MED ORDER — ONDANSETRON HCL 4 MG/2ML IJ SOLN
INTRAMUSCULAR | Status: DC | PRN
  Administered 2023-01-19: 4 mg via INTRAVENOUS

## 2023-01-19 MED ORDER — EPHEDRINE 5 MG/ML INJ
INTRAVENOUS | Status: AC
  Filled 2023-01-19: qty 5

## 2023-01-19 MED ORDER — KETOROLAC TROMETHAMINE 30 MG/ML IJ SOLN
INTRAMUSCULAR | Status: AC
  Filled 2023-01-19: qty 1

## 2023-01-19 MED ORDER — MIDAZOLAM HCL 5 MG/5ML IJ SOLN
INTRAMUSCULAR | Status: DC | PRN
  Administered 2023-01-19: 2 mg via INTRAVENOUS

## 2023-01-19 MED ORDER — LACTATED RINGERS IV SOLN: INTRAVENOUS | Status: DC

## 2023-01-19 MED ORDER — SPY AGENT GREEN - (INDOCYANINE FOR INJECTION)
1.2500 mg | Freq: Once | INTRAMUSCULAR | Status: AC
  Administered 2023-01-19: 1.25 mg via INTRAVENOUS
  Filled 2023-01-19: qty 10

## 2023-01-19 MED ORDER — AMISULPRIDE (ANTIEMETIC) 5 MG/2ML IV SOLN: 10.0000 mg | Freq: Once | INTRAVENOUS | Status: DC | PRN

## 2023-01-19 MED ORDER — DEXAMETHASONE SODIUM PHOSPHATE 10 MG/ML IJ SOLN
INTRAMUSCULAR | Status: DC | PRN
  Administered 2023-01-19: 10 mg via INTRAVENOUS

## 2023-01-19 MED ORDER — BUPIVACAINE HCL 0.25 % IJ SOLN
INTRAMUSCULAR | Status: AC
  Filled 2023-01-19: qty 1

## 2023-01-19 MED ORDER — FENTANYL CITRATE (PF) 100 MCG/2ML IJ SOLN
INTRAMUSCULAR | Status: DC | PRN
  Administered 2023-01-19: 50 ug via INTRAVENOUS
  Administered 2023-01-19: 100 ug via INTRAVENOUS
  Administered 2023-01-19: 50 ug via INTRAVENOUS

## 2023-01-19 MED ORDER — BUPIVACAINE HCL (PF) 0.25 % IJ SOLN
INTRAMUSCULAR | Status: DC | PRN
  Administered 2023-01-19: 20 mL

## 2023-01-19 MED ORDER — DEXMEDETOMIDINE HCL IN NACL 80 MCG/20ML IV SOLN
INTRAVENOUS | Status: DC | PRN
  Administered 2023-01-19 (×2): 8 ug via INTRAVENOUS

## 2023-01-19 MED ORDER — FENTANYL CITRATE (PF) 100 MCG/2ML IJ SOLN
INTRAMUSCULAR | Status: AC
  Filled 2023-01-19: qty 2

## 2023-01-19 MED ORDER — ORAL CARE MOUTH RINSE: 15.0000 mL | Freq: Once | OROMUCOSAL | Status: AC

## 2023-01-19 MED ORDER — EPHEDRINE SULFATE-NACL 50-0.9 MG/10ML-% IV SOSY
PREFILLED_SYRINGE | INTRAVENOUS | Status: DC | PRN
  Administered 2023-01-19: 10 mg via INTRAVENOUS

## 2023-01-19 MED ORDER — CIPROFLOXACIN IN D5W 400 MG/200ML IV SOLN
400.0000 mg | INTRAVENOUS | Status: AC
  Administered 2023-01-19: 400 mg via INTRAVENOUS
  Filled 2023-01-19: qty 200

## 2023-01-19 MED ORDER — OXYCODONE-ACETAMINOPHEN 5-325 MG PO TABS: 1.0000 | ORAL_TABLET | Freq: Four times a day (QID) | ORAL | 0 refills | Status: DC | PRN

## 2023-01-19 MED ORDER — FENTANYL CITRATE PF 50 MCG/ML IJ SOSY
PREFILLED_SYRINGE | INTRAMUSCULAR | Status: AC
  Filled 2023-01-19: qty 1

## 2023-01-19 MED ORDER — FENTANYL CITRATE PF 50 MCG/ML IJ SOSY
25.0000 ug | PREFILLED_SYRINGE | INTRAMUSCULAR | Status: DC | PRN
  Administered 2023-01-19: 50 ug via INTRAVENOUS
  Administered 2023-01-19: 25 ug via INTRAVENOUS
  Administered 2023-01-19: 50 ug via INTRAVENOUS
  Administered 2023-01-19: 25 ug via INTRAVENOUS

## 2023-01-19 SURGICAL SUPPLY — 55 items
ADH SKN CLS APL DERMABOND .7 (GAUZE/BANDAGES/DRESSINGS)
APL PRP STRL LF DISP 70% ISPRP (MISCELLANEOUS) ×1
APL SRG 38 LTWT LNG FL B (MISCELLANEOUS)
APPLICATOR ARISTA FLEXITIP XL (MISCELLANEOUS) IMPLANT
APPLIER CLIP 5 13 M/L LIGAMAX5 (MISCELLANEOUS) ×1
APPLIER CLIP ROT 10 11.4 M/L (STAPLE)
APR CLP MED LRG 11.4X10 (STAPLE)
APR CLP MED LRG 5 ANG JAW (MISCELLANEOUS) ×1
BAG COUNTER SPONGE SURGICOUNT (BAG) IMPLANT
BAG SPEC RTRVL 10 TROC 200 (ENDOMECHANICALS) ×1
BAG SPNG CNTER NS LX DISP (BAG)
CABLE HIGH FREQUENCY MONO STRZ (ELECTRODE) ×1 IMPLANT
CHLORAPREP W/TINT 26 (MISCELLANEOUS) ×1 IMPLANT
CLIP APPLIE 5 13 M/L LIGAMAX5 (MISCELLANEOUS) IMPLANT
CLIP APPLIE ROT 10 11.4 M/L (STAPLE) IMPLANT
CLIP LIGATING HEMO O LOK GREEN (MISCELLANEOUS) IMPLANT
COVER MAYO STAND XLG (MISCELLANEOUS) IMPLANT
COVER SURGICAL LIGHT HANDLE (MISCELLANEOUS) ×1 IMPLANT
DERMABOND ADVANCED .7 DNX12 (GAUZE/BANDAGES/DRESSINGS) IMPLANT
DRAPE C-ARM 42X120 X-RAY (DRAPES) IMPLANT
DRSG TEGADERM 2-3/8X2-3/4 SM (GAUZE/BANDAGES/DRESSINGS) ×3 IMPLANT
DRSG TEGADERM 4X4.75 (GAUZE/BANDAGES/DRESSINGS) ×1 IMPLANT
ELECT REM PT RETURN 15FT ADLT (MISCELLANEOUS) ×1 IMPLANT
GAUZE SPONGE 2X2 8PLY STRL LF (GAUZE/BANDAGES/DRESSINGS) ×1 IMPLANT
GLOVE BIO SURGEON STRL SZ7.5 (GLOVE) ×1 IMPLANT
GLOVE INDICATOR 8.0 STRL GRN (GLOVE) ×1 IMPLANT
GOWN STRL REUS W/ TWL XL LVL3 (GOWN DISPOSABLE) ×1 IMPLANT
GOWN STRL REUS W/TWL XL LVL3 (GOWN DISPOSABLE) ×1
GRASPER SUT TROCAR 14GX15 (MISCELLANEOUS) IMPLANT
HEMOSTAT ARISTA ABSORB 3G PWDR (HEMOSTASIS) IMPLANT
HEMOSTAT SNOW SURGICEL 2X4 (HEMOSTASIS) IMPLANT
IRRIG SUCT STRYKERFLOW 2 WTIP (MISCELLANEOUS) ×1
IRRIGATION SUCT STRKRFLW 2 WTP (MISCELLANEOUS) ×1 IMPLANT
KIT BASIN OR (CUSTOM PROCEDURE TRAY) ×1 IMPLANT
KIT TURNOVER KIT A (KITS) IMPLANT
L-HOOK LAP DISP 36CM (ELECTROSURGICAL)
LHOOK LAP DISP 36CM (ELECTROSURGICAL) IMPLANT
POUCH RETRIEVAL ECOSAC 10 (ENDOMECHANICALS) ×1 IMPLANT
POUCH RETRIEVAL ECOSAC 10MM (ENDOMECHANICALS) ×1
SCISSORS LAP 5X35 DISP (ENDOMECHANICALS) ×1 IMPLANT
SET CHOLANGIOGRAPH MIX (MISCELLANEOUS) IMPLANT
SET TUBE SMOKE EVAC HIGH FLOW (TUBING) ×1 IMPLANT
SLEEVE Z-THREAD 5X100MM (TROCAR) ×2 IMPLANT
SPIKE FLUID TRANSFER (MISCELLANEOUS) ×1 IMPLANT
STRIP CLOSURE SKIN 1/2X4 (GAUZE/BANDAGES/DRESSINGS) ×1 IMPLANT
SUT MNCRL AB 4-0 PS2 18 (SUTURE) ×1 IMPLANT
SUT VIC AB 0 UR5 27 (SUTURE) IMPLANT
SUT VICRYL 0 TIES 12 18 (SUTURE) IMPLANT
SUT VICRYL 0 UR6 27IN ABS (SUTURE) IMPLANT
TOWEL OR 17X26 10 PK STRL BLUE (TOWEL DISPOSABLE) ×1 IMPLANT
TOWEL OR NON WOVEN STRL DISP B (DISPOSABLE) ×1 IMPLANT
TRAY LAPAROSCOPIC (CUSTOM PROCEDURE TRAY) ×1 IMPLANT
TROCAR 11X100 Z THREAD (TROCAR) IMPLANT
TROCAR BALLN 12MMX100 BLUNT (TROCAR) ×1 IMPLANT
TROCAR Z-THREAD OPTICAL 5X100M (TROCAR) ×1 IMPLANT

## 2023-01-19 NOTE — Discharge Instructions (Signed)

## 2023-01-19 NOTE — Anesthesia Postprocedure Evaluation (Signed)
Anesthesia Post Note  Patient: Natasha Fritz  Procedure(s) Performed: LAPAROSCOPIC CHOLECYSTECTOMY WITH ICG DYE     Patient location during evaluation: PACU Anesthesia Type: General Level of consciousness: awake and alert Pain management: pain level controlled Vital Signs Assessment: post-procedure vital signs reviewed and stable Respiratory status: spontaneous breathing, nonlabored ventilation, respiratory function stable and patient connected to nasal cannula oxygen Cardiovascular status: blood pressure returned to baseline and stable Postop Assessment: no apparent nausea or vomiting Anesthetic complications: no  No notable events documented.  Last Vitals:  Vitals:   01/19/23 1545 01/19/23 1600  BP: (!) 134/90 137/81  Pulse: (!) 57 64  Resp: 15 11  Temp:    SpO2: 96% 95%    Last Pain:  Vitals:   01/19/23 1610  TempSrc:   PainSc: 3                  Kennieth Rad

## 2023-01-19 NOTE — Progress Notes (Signed)
Unable to reach care provider on phone from [facility] to update when last meds were given, made CRNA Peggy Williford aware.

## 2023-01-19 NOTE — Anesthesia Procedure Notes (Signed)
Procedure Name: Intubation Date/Time: 01/19/2023 1:31 PM  Performed by: Theodosia Quay, CRNAPre-anesthesia Checklist: Patient identified, Emergency Drugs available, Suction available, Patient being monitored and Timeout performed Patient Re-evaluated:Patient Re-evaluated prior to induction Oxygen Delivery Method: Circle system utilized Preoxygenation: Pre-oxygenation with 100% oxygen Induction Type: IV induction Ventilation: Mask ventilation without difficulty Laryngoscope Size: Mac and 3 Grade View: Grade II Tube type: Oral Tube size: 7.0 mm Number of attempts: 1 Airway Equipment and Method: Stylet Placement Confirmation: ETT inserted through vocal cords under direct vision, positive ETCO2, CO2 detector and breath sounds checked- equal and bilateral Secured at: 21 cm Tube secured with: Tape Dental Injury: Teeth and Oropharynx as per pre-operative assessment  Comments: ATOI

## 2023-01-19 NOTE — H&P (Signed)
REFERRING PHYSICIAN: Room, Emergency  PROVIDER: Talyssa Gibas Sherril Cong, MD  MRN: [id number] DOB: 11-25-1960 DATE OF ENCOUNTER: 01/07/2023  Subjective  Chief Complaint: New Consultation (Gallbladder)   History of Present Illness: Natasha Fritz is a 62 y.o. female who is seen today as an office consultation at the request of Dr. Room for evaluation of New Consultation (Gallbladder) .  She ended up in the emergency room the other night because of severe right upper quadrant She points primarily to her right upper quadrant but states her upper abdomen was uncomfortable. It was associated with nausea but no vomiting. No fevers or chills. She was evaluated in the ER with a CT scan which showed cholelithiasis along with a gallstone in the neck of the gallbladder and a mild leukocytosis of 12.2. Her pain improved and she was discharged from the ER. She reports that she has probably had issues with her gallbladder for about a year. She initially probably thought it was due to a cracked rib. She reports an intermittent history of 1 year of right rib or upper quadrant discomfort. Not necessarily related to oral intake. Does not radiate.  Since being in the ER she has had persistent right upper quadrant discomfort. About a 5 out of 10. She took some of the prescription pain medicine given to her by the ER but if Natasha Fritz knocks her out so she has not been taking it. She states her appetite has been poor. No more nausea or vomiting. No diarrhea or constipation. No fever or chills.    Review of Systems: A complete review of systems was obtained from the patient. I have reviewed this information and discussed as appropriate with the patient. See HPI as well for other ROS.  ROS  Medical History: Past Medical History: Diagnosis Date History of cancer Hyperlipidemia Hypertension Sleep apnea  There is no problem list on file for this patient.  Past Surgical History: Procedure Laterality Date achilles tendon  surgery HYSTERECTOMY knee surgery neck surgery wrist surgery   Allergies Allergen Reactions Penicillin Hives Statins-Hmg-Coa Reductase Inhibitors Muscle Pain  Current Outpatient Medications on File Prior to Visit Medication Sig Dispense Refill ALPRAZolam (XANAX) 0.5 MG tablet Take 1 tablet by mouth 2 (two) times daily as needed cetirizine (ZYRTEC) 10 MG tablet TAKE 1 TABLET BY MOUTH DAILY AS NEEDED FOR allergies escitalopram oxalate (LEXAPRO) 10 MG tablet Take 10 mg by mouth once daily fluticasone propionate (FLONASE) 50 mcg/actuation nasal spray Place 2 sprays into both nostrils once daily olmesartan (BENICAR) 40 MG tablet  No current facility-administered medications on file prior to visit.  Family History Problem Relation Age of Onset High blood pressure (Hypertension) Mother Hyperlipidemia (Elevated cholesterol) Mother Breast cancer Mother High blood pressure (Hypertension) Father Hyperlipidemia (Elevated cholesterol) Father Stroke Brother High blood pressure (Hypertension) Brother Coronary Artery Disease (Blocked arteries around heart) Brother Diabetes Brother   Social History  Tobacco Use Smoking Status Never Smokeless Tobacco Never   Social History  Socioeconomic History Marital status: Married Tobacco Use Smoking status: Never Smokeless tobacco: Never Vaping Use Vaping status: Never Used Substance and Sexual Activity Alcohol use: Yes Drug use: Never  Social Determinants of Health  Received from [employer] [facility]  Objective:  Vitals: 01/07/23 1019 BP: (!) 129/94 Pulse: 96 Temp: 36.4 C (97.6 F) SpO2: 98% Weight: 85.2 kg (187 lb 12.8 oz) Height: 168.9 cm (5' 6.5") PainSc: 6  Body mass index is 29.86 kg/m.  Constitutional: NAD; conversant; no deformities MI: Not ill-appearing Eyes: Moist conjunctiva; no lid lag;  anicteric; PERRL Neck: Trachea midline; no thyromegaly Lungs: Normal respiratory effort; no tactile  fremitus CV: RRR; no palpable thrills; no pitting edema GI: Abd soft, nondistended, not terribly tender in the right upper quadrant. Certainly no rebound or guarding.; no palpable hepatosplenomegaly MSK: Normal gait; no clubbing/cyanosis Psychiatric: Appropriate affect; alert and oriented x3 Lymphatic: No palpable cervical or axillary lymphadenopathy Skin: No rash, lesions or jaundice  Labs, Imaging and Diagnostic Testing: ED MD note 4/14/424  CT a/p 01/04/23 - multiple gallstones, stone in GB neck measuring 1.5 cm  Labs 01/04/23 - cbc wbc 12, cmet ok except bld sugar 145; nml lipase  Pt message to PCP 01/05/23 Assessment and Plan:   Diagnoses and all orders for this visit:  Symptomatic cholelithiasis    I believe the patient's symptoms are consistent with gallbladder disease.  We discussed gallbladder disease. The patient was given Agricultural engineer. We discussed non-operative and operative management. We discussed the signs & symptoms of acute cholecystitis. She inquired whether or not she would need or needs a nuclear medicine scan. I told her my opinion no. Her symptoms are pretty consistent with gallbladder disease and she has radiological evidence of gallstones so therefore I do not think a nuclear medicine scan would change my recommendation  I discussed laparoscopic cholecystectomy with possible IOC in detail. The patient was given educational material as well as diagrams detailing the procedure. We discussed the risks and benefits of a laparoscopic cholecystectomy including, but not limited to bleeding, infection, injury to surrounding structures such as the intestine or liver, bile leak, retained gallstones, need to convert to an open procedure, prolonged diarrhea, blood clots such as DVT, common bile duct injury, anesthesia risks, and possible need for additional procedures. We discussed the typical post-operative recovery course. I explained that the likelihood of improvement  of their symptoms is good.  Advised her to stay on a bland diet. Also told her she could take at 1000 mg of Tylenol 3 times a day  This patient encounter took 30 minutes today to perform the following: take history, perform exam, review outside records, interpret imaging, counsel the patient on their diagnosis and document encounter, findings & plan in the EHR  No follow-ups on file.  Natasha Bazinet Sherril Cong, MD General, Minimally Invasive, & Bariatric Surgery     Electronically signed by Gara Kroner, MD at 01/07/2023 10:51 AM EDT

## 2023-01-19 NOTE — Transfer of Care (Signed)
Immediate Anesthesia Transfer of Care Note  Patient: Natasha Fritz  Procedure(s) Performed: Procedure(s): LAPAROSCOPIC CHOLECYSTECTOMY WITH ICG DYE (N/A)  Patient Location: PACU  Anesthesia Type:General  Level of Consciousness:  sedated, patient cooperative and responds to stimulation  Airway & Oxygen Therapy:Patient Spontanous Breathing and Patient connected to face mask oxgen  Post-op Assessment:  Report given to PACU RN and Post -op Vital signs reviewed and stable  Post vital signs:  Reviewed and stable  Last Vitals:  Vitals:   01/19/23 1140 01/19/23 1440  BP: 128/77 128/75  Pulse: 69 (!) 59  Resp: 18 14  Temp: 36.8 C   SpO2: 96% 100%    Complications: No apparent anesthesia complications

## 2023-01-19 NOTE — Anesthesia Preprocedure Evaluation (Addendum)
Anesthesia Evaluation  Patient identified by MRN, date of birth, ID band Patient awake    Reviewed: Allergy & Precautions, NPO status , Patient's Chart, lab work & pertinent test results  History of Anesthesia Complications (+) PONV and history of anesthetic complications  Airway Mallampati: II  TM Distance: >3 FB Neck ROM: Full    Dental  (+) Dental Advisory Given   Pulmonary sleep apnea    breath sounds clear to auscultation       Cardiovascular hypertension, Pt. on medications  Rhythm:Regular Rate:Normal     Neuro/Psych  Headaches    GI/Hepatic Neg liver ROS,GERD  ,,  Endo/Other  negative endocrine ROS    Renal/GU negative Renal ROS     Musculoskeletal  (+) Arthritis ,    Abdominal   Peds  Hematology negative hematology ROS (+)   Anesthesia Other Findings   Reproductive/Obstetrics                             Anesthesia Physical Anesthesia Plan  ASA: 2  Anesthesia Plan: General   Post-op Pain Management: Tylenol PO (pre-op)* and Toradol IV (intra-op)*   Induction: Intravenous  PONV Risk Score and Plan: 4 or greater and Dexamethasone, Ondansetron, Midazolam and Scopolamine patch - Pre-op  Airway Management Planned: Oral ETT  Additional Equipment: None  Intra-op Plan:   Post-operative Plan: Extubation in OR  Informed Consent: I have reviewed the patients History and Physical, chart, labs and discussed the procedure including the risks, benefits and alternatives for the proposed anesthesia with the patient or authorized representative who has indicated his/her understanding and acceptance.     Dental advisory given  Plan Discussed with: CRNA  Anesthesia Plan Comments:        Anesthesia Quick Evaluation

## 2023-01-19 NOTE — Op Note (Signed)
Natasha Fritz [id number] [birth year] 01/19/2023  Laparoscopic Cholecystectomy with near infrared fluorescent cholangiography procedure Note  Indications: This patient presents with symptomatic gallbladder disease and will undergo laparoscopic cholecystectomy.  Pre-operative Diagnosis: symptomatic cholelithiasis  Post-operative Diagnosis: acute on chronic cholecystitis with calculous  Surgeon: Gaynelle Adu MD FACS  Assistants: none  Anesthesia: General endotracheal anesthesia  Procedure Details  The patient was seen again in the Holding Room. The risks, benefits, complications, treatment options, and expected outcomes were discussed with the patient. The possibilities of reaction to medication, pulmonary aspiration, perforation of viscus, bleeding, recurrent infection, finding a normal gallbladder, the need for additional procedures, failure to diagnose a condition, the possible need to convert to an open procedure, and creating a complication requiring transfusion or operation were discussed with the patient. The likelihood of improving the patient's symptoms with return to their baseline status is good.  The patient and/or family concurred with the proposed plan, giving informed consent. The site of surgery properly noted. The patient was taken to Operating Room, identified as Natasha Fritz and the procedure verified as Laparoscopic Cholecystectomy with ICG dye.  A Time Out was held and the above information confirmed. Antibiotic prophylaxis was administered.    ICG dye was administered preoperatively.    General endotracheal anesthesia was then administered and tolerated well. After the induction, the abdomen was prepped with Chloraprep and draped in the sterile fashion. The patient was positioned in the supine position.  Local anesthetic agent was injected into the skin near the umbilicus and an incision made. We dissected down to the abdominal fascia with blunt dissection.  The fascia was  incised vertically and we entered the peritoneal cavity bluntly.  A pursestring suture of 0-Vicryl was placed around the fascial opening.  The Hasson cannula was inserted and secured with the stay suture.  Pneumoperitoneum was then created with CO2 and tolerated well without any adverse changes in the patient's vital signs. An 5-mm port was placed in the subxiphoid position.  Two 5-mm ports were placed in the right upper quadrant. All skin incisions were infiltrated with a local anesthetic agent before making the incision and placing the trocars.   We positioned the patient in reverse Trendelenburg, tilted slightly to the patient's left.  The gallbladder was identified, the fundus grasped and retracted cephalad.  The gallbladder was thick walled consistent with chronic inflammation.  There was a fair amount of adhesions of omentum to the diaphragm and body of the gallbladder which were taken down.  The duodenum was slightly tethered to the infundibulum and this was gently dissected away with suction irrigator catheter.  Adhesions were lysed bluntly and with the electrocautery where indicated, taking care not to injure any adjacent organs or viscus. The infundibulum was grasped and retracted laterally, exposing the peritoneum overlying the triangle of Calot. This was then divided and exposed in a blunt fashion. A critical view of the cystic duct and cystic artery was obtained.  The cystic duct was clearly identified and bluntly dissected circumferentially.  Utilizing the [employer]ker camera system near infrared fluorescent activity was visualized in the liver, cystic duct, common hepatic duct and common bile duct.  This served as a secondary confirmation of our anatomy.  The cystic duct was then ligated with clips and divided. The cystic artery (including a posterior and anterior branch) which had been identified & dissected free were ligated with clips and divided as well.   The gallbladder was dissected from the  liver bed in retrograde fashion with  the electrocautery.  There was some bleeding from the gallbladder fossa.  The gallbladder was removed and placed in an Ecco sac.  The gallbladder and Ecco sac were then removed through the umbilical port site. The liver bed was irrigated and inspected. Hemostasis was achieved with the electrocautery. Copious irrigation was utilized and was repeatedly aspirated until clear.  It elected placed a piece of surgical snow.  the pursestring suture was used to close the umbilical fascia.  An additional interrupted 0 Vicryl suture was placed at the umbilical fascia with a PMI suture passer with laparoscopic guidance.  We again inspected the right upper quadrant for hemostasis.  The umbilical closure was inspected and there was no air leak and nothing trapped within the closure. Pneumoperitoneum was released as we removed the trocars.  4-0 Monocryl was used to close the skin.    steri-strips, and clean dressings were applied. The patient was then extubated and brought to the recovery room in stable condition. Instrument, sponge, and needle counts were correct at closure and at the conclusion of the case.   Findings: Cholecystitis with Cholelithiasis Positive critical view Near infrared fluorescent activity seen within the common hepatic duct, cystic duct, common bile duct and small bowel Positive surgical snow left in the gallbladder fossa  Estimated Blood Loss: Minimal         Drains: none         Specimens: Gallbladder           Complications: None; patient tolerated the procedure well.         Disposition: PACU - hemodynamically stable.         Condition: stable  Mary Sella. Andrey Campanile, MD, FACS General, Bariatric, & Minimally Invasive Surgery [facility] Surgery,  [facility]

## 2023-01-19 NOTE — Interval H&P Note (Signed)
History and Physical Interval Note:  01/19/2023 12:52 PM  Natasha Fritz  has presented today for surgery, with the diagnosis of SYMPTOMATIC CHOLELITHIASIS.  The various methods of treatment have been discussed with the patient and family. After consideration of risks, benefits and other options for treatment, the patient has consented to  Procedure(s): LAPAROSCOPIC CHOLECYSTECTOMY WITH ICG DYE (N/A) as a surgical intervention.  The patient's history has been reviewed, patient examined, no change in status, stable for surgery.  I have reviewed the patient's chart and labs.  Questions were answered to the patient's satisfaction.     Gaynelle Adu

## 2023-01-20 ENCOUNTER — Encounter (HOSPITAL_COMMUNITY): Payer: Self-pay | Admitting: General Surgery

## 2023-01-21 LAB — SURGICAL PATHOLOGY

## 2023-02-10 NOTE — Progress Notes (Unsigned)
PATIENT: Natasha Fritz DOB: May 19, 1961  REASON FOR VISIT: follow up HISTORY FROM: patient  No chief complaint on file.    HISTORY OF PRESENT ILLNESS:  02/10/23 ALL:  Natasha Fritz returns for follow up for OSA on CPAP. She continues to do well.  Leak?    02/05/2022 ALL: Natasha Fritz is a 62 y.o. female here today for follow up for OSA on CPAP. She was seen in consult with Dr Vickey Huger 09/2021 for snoring and fatigue. HST 11/06/2021 showed moderately severe OSA with total AHI 28.8/hr, REM AHI 32.1/hr an no significant hypoxia. AutoPAP ordered. Since, she is doing fairly well. She does not love using CPAP but does note that she wakes up less often at night. Her husband has told her that she no longer snores. She has noticed a leak with small nasal pillow but reports that she has not changed mask since starting therapy.     HISTORY: (copied from Dr Dohmeier's previous note)  Natasha Fritz is a 62 y.o.  patient who is seen here upon referral on 10/01/2021 from Dr. Oneta Rack for a Sleep consultation. .  Chief concern according to patient :  Internal referral for snoring and fatigue. Here to r/o OSA. No prior SS. Pt noticed snoring about 3 years ago. Works from home.     Natasha Fritz  has a past medical history of Achilles tendinitis, Acid reflux (07/09/2020), Anxiety, Basal cell carcinoma of nose (05/23/2015), Endometrial hyperplasia without atypia, simple (09/10/2012), Endometrial hyperplasia without atypia, simple (09/10/2012), HSV-1 infection, HTN (hypertension), Hyperlipidemia, Migraine, and PONV (postoperative nausea and vomiting). Achilles tendon surgery 09-2020.    Sleep relevant medical history: snoring for about 3 years ( weight gain  pandemic) since mother's death in Dec 17, 2018.   Nocturia 1-3, no Tonsillectomy no other ENT surgery, cervical spine surgery anterior fusion.   Family medical /sleep history: Father with OSA.Natasha Fritz    Social history:  Patient is working as Consulting civil engineer for [employer] and  lives in a household with spouse, adult 3 children-  one dog.  The patient currently works from home , often at night, too. 10 hour shifts, on call duties.  Tobacco use; never .  ETOH use ; social ,  Caffeine intake in form of Coffee( /) Soda( /) Tea ( 2-3 cups in Am and hot tea) ,nor energy drinks. Regular exercise in form of walking the dog.     Sleep habits are as follows: The patient's dinner time is between 6-7 PM. The patient goes to bed at 8-12 PM and continues to sleep for 2-4 hours, wakes for 1-3 bathroom breaks.   The preferred sleep position is on her sides, with the support of 1-2 pillows. Snores in supine and on her side.  Dreams are reportedly infrequent.Natasha Fritz  6  AM is the usual rise time. The patient wakes up with an alarm.  She reports not feeling refreshed or restored in AM, with symptoms such as dry mouth, morning headaches, and residual fatigue.  Naps are taken infrequently, lasting from 60 to 120 minutes and are not refreshing.    REVIEW OF SYSTEMS: Out of a complete 14 system review of symptoms, the patient complains only of the following symptoms, fatigue and all other reviewed systems are negative.  ESS: 10/24  ALLERGIES: Allergies  Allergen Reactions   Penicillins Hives    REACTION: hives reaction seen in 1993 per patient hives were "all over" no SOB, no facial swelling, no dizziness / syncope  Pravastatin Other (See Comments)    myalgias    HOME MEDICATIONS: Outpatient Medications Prior to Visit  Medication Sig Dispense Refill   ALPRAZolam (XANAX) 0.5 MG tablet TAKE 1 TABLET BY MOUTH 2 TIMES DAILY AS NEEDED (Patient taking differently: Take 0.5 mg by mouth daily as needed for anxiety or sleep.) 60 tablet 0   aspirin 81 MG EC tablet Take 81 mg by mouth daily.     azelastine (ASTELIN) 0.1 % nasal spray Place 1 spray into both nostrils 2 (two) times daily. Use in each nostril as directed (Patient taking differently: Place 1 spray into both nostrils 2 (two) times  daily as needed for allergies. Use in each nostril as directed) 30 mL 11   benzonatate (TESSALON PERLES) 100 MG capsule Take 1 capsule (100 mg total) by mouth every 6 (six) hours as needed for cough. 30 capsule 1   cetirizine (ZYRTEC ALLERGY) 10 MG tablet Take 1 tablet (10 mg total) by mouth daily as needed for allergies. (Patient taking differently: Take 10 mg by mouth daily.) 30 tablet 11   CHELATED MAGNESIUM PO Take 72 mg by mouth daily.     cholecalciferol (VITAMIN D) 1000 UNITS tablet Take 1,000 Units by mouth daily.     ezetimibe (ZETIA) 10 MG tablet Take 1 tablet (10 mg total) by mouth daily. 90 tablet 3   fluocinonide cream (LIDEX) 0.05 % Apply 1 application  topically daily as needed (itching).     fluticasone (FLONASE) 50 MCG/ACT nasal spray Place 2 sprays into both nostrils daily. 16 g 11   ketoconazole (NIZORAL) 2 % cream Apply 1 application  topically daily as needed for irritation.     meloxicam (MOBIC) 15 MG tablet Take 1 tab daily with food for 2-4 weeks then daily as needed. Do not take with ibuprofen or aleve. 30 tablet 1   Multiple Vitamin (MULTIVITAMIN) tablet Take 1 tablet by mouth daily.     olmesartan (BENICAR) 40 MG tablet TAKE 1 TABLET DAILY FOR BLOOD PRESSURE (Patient taking differently: Take 20 mg by mouth daily.) 90 tablet 3   Omega 3-6-9 Fatty Acids (OMEGA 3-6-9 PO) Take 3,600 mg by mouth daily.     ondansetron (ZOFRAN) 4 MG tablet Take 1 tablet (4 mg total) by mouth every 8 (eight) hours as needed for nausea or vomiting. 12 tablet 0   oxyCODONE-acetaminophen (PERCOCET/ROXICET) 5-325 MG tablet Take 1 tablet by mouth every 6 (six) hours as needed for severe pain. 12 tablet 0   promethazine-dextromethorphan (PROMETHAZINE-DM) 6.25-15 MG/5ML syrup Take 5 mLs by mouth 4 (four) times daily as needed for cough. 240 mL 1   Pseudoephedrine HCl (SUDAFED PO) Take 1 tablet by mouth daily as needed (Sinus).     valACYclovir (VALTREX) 500 MG tablet Take     1 tablet     Daily  -   prophylaxis for Cold Sores / Fever Blisters (Patient taking differently: Take 500 mg by mouth as needed (prophylaxis for Cold Sores / Fever Blisters).) 90 tablet 0   venlafaxine XR (EFFEXOR-XR) 37.5 MG 24 hr capsule Take 37.5 mg by mouth daily. For hot flashes.     No facility-administered medications prior to visit.    PAST MEDICAL HISTORY: Past Medical History:  Diagnosis Date   Achilles tendinitis    Acid reflux 07/09/2020   Anemia    Anxiety    Basal cell carcinoma of nose 05/23/2015   Endometrial hyperplasia without atypia, simple 09/10/2012   Endometrial hyperplasia without atypia, simple 09/10/2012  HSV-1 infection    HTN (hypertension)    Hyperlipidemia    Migraine    PONV (postoperative nausea and vomiting)    Pre-diabetes    Recurrent upper respiratory infection (URI)    Urticaria     PAST SURGICAL HISTORY: Past Surgical History:  Procedure Laterality Date   ACHILLES TENDON SURGERY Right 10/19/2020   Dr. Al Corpus   acl replacement     BILATERAL SALPINGECTOMY  09/10/2012   Procedure: BILATERAL SALPINGECTOMY;  Surgeon: Robley Fries, MD;  Location: [facility];  Service: Gynecology;  Laterality: Bilateral;   CERVICAL DISC SURGERY  2012   fusion, Dr. Jeral Fruit   CHOLECYSTECTOMY N/A 01/19/2023   Procedure: LAPAROSCOPIC CHOLECYSTECTOMY WITH ICG DYE;  Surgeon: Gaynelle Adu, MD;  Location: [facility];  Service: General;  Laterality: N/A;   COLONOSCOPY  2011   De Quervian's release Bilateral 2019   Dr. Mina Marble    ROBOTIC ASSISTED TOTAL HYSTERECTOMY  09/10/2012   Procedure: ROBOTIC ASSISTED TOTAL HYSTERECTOMY;  Surgeon: Robley Fries, MD;  Location: [facility];  Service: Gynecology;  Laterality: N/A;  3 hrs.   TUBAL LIGATION  1994    FAMILY HISTORY: Family History  Problem Relation Age of Onset   Hypertension Mother    Stroke Mother    Breast cancer Mother    Lung cancer Mother    Alzheimer's disease Father    Hyperlipidemia Brother    Heart disease Brother    Diabetes  Brother    Hypertension Brother    Hypertension Brother    CVA Brother 75   Cancer Maternal Grandfather    Asthma Daughter    Esophageal cancer Neg Hx    Colon cancer Neg Hx    Colon polyps Neg Hx    Stomach cancer Neg Hx    Rectal cancer Neg Hx     SOCIAL HISTORY: Social History   Socioeconomic History   Marital status: Married    Spouse name: Molly Maduro   Number of children: 3   Years of education: Not on file   Highest education level: Bachelor's degree (e.g., BA, AB, BS)  Occupational History   Not on file  Tobacco Use   Smoking status: Never    Passive exposure: Past   Smokeless tobacco: Never   Tobacco comments:    tobacco use - no   Vaping Use   Vaping Use: Never used  Substance and Sexual Activity   Alcohol use: Yes    Alcohol/week: 0.0 standard drinks of alcohol    Comment: rare   Drug use: No   Sexual activity: Not Currently    Partners: Male    Birth control/protection: Post-menopausal, Surgical  Other Topics Concern   Not on file  Social History Narrative   Married    Lives at home with husband   Right handed   Caffeine: 3 cups of tea with same tea bag.    full time, gets regular exercise.    Social Determinants of Health   Financial Resource Strain: Not on file  Food Insecurity: Not on file  Transportation Needs: Not on file  Physical Activity: Not on file  Stress: Not on file  Social Connections: Not on file  Intimate Partner Violence: Not on file     PHYSICAL EXAM  There were no vitals filed for this visit.  There is no height or weight on file to calculate BMI.  Generalized: Well developed, in no acute distress  Cardiology: normal rate and rhythm, no murmur noted Respiratory: clear to  auscultation bilaterally  Neurological examination  Mentation: Alert oriented to time, place, history taking. Follows all commands speech and language fluent Cranial nerve II-XII: Pupils were equal round reactive to light. Extraocular movements were  full, visual field were full  Motor: The motor testing reveals 5 over 5 strength of all 4 extremities. Good symmetric motor tone is noted throughout.  Gait and station: Gait is normal.    DIAGNOSTIC DATA (LABS, IMAGING, TESTING) - I reviewed patient records, labs, notes, testing and imaging myself where available.      No data to display           Lab Results  Component Value Date   WBC 6.2 01/14/2023   HGB 13.1 01/14/2023   HCT 39.6 01/14/2023   MCV 89.0 01/14/2023   PLT 355 01/14/2023      Component Value Date/Time   NA 139 01/19/2023 1539   K 3.9 01/19/2023 1539   CL 104 01/19/2023 1539   CO2 24 01/19/2023 1539   GLUCOSE 129 (H) 01/19/2023 1539   BUN 13 01/19/2023 1539   CREATININE 0.82 01/19/2023 1539   CREATININE 0.78 01/14/2023 1129   CALCIUM 8.7 (L) 01/19/2023 1539   PROT 7.0 01/19/2023 1539   ALBUMIN 4.0 01/19/2023 1539   AST 31 01/19/2023 1539   ALT 26 01/19/2023 1539   ALKPHOS 80 01/19/2023 1539   BILITOT 0.5 01/19/2023 1539   GFRNONAA >60 01/19/2023 1539   GFRNONAA 87 01/11/2021 0929   GFRAA 101 01/11/2021 0929   Lab Results  Component Value Date   CHOL 173 01/14/2023   HDL 39 (L) 01/14/2023   LDLCALC 102 (H) 01/14/2023   TRIG 204 (H) 01/14/2023   CHOLHDL 4.4 01/14/2023   Lab Results  Component Value Date   HGBA1C 6.2 (H) 01/14/2023   Lab Results  Component Value Date   VITAMINB12 696 07/16/2022   Lab Results  Component Value Date   TSH 2.37 07/16/2022     ASSESSMENT AND PLAN 62 y.o. year old female  has a past medical history of Achilles tendinitis, Acid reflux (07/09/2020), Anemia, Anxiety, Basal cell carcinoma of nose (05/23/2015), Endometrial hyperplasia without atypia, simple (09/10/2012), Endometrial hyperplasia without atypia, simple (09/10/2012), HSV-1 infection, HTN (hypertension), Hyperlipidemia, Migraine, PONV (postoperative nausea and vomiting), Pre-diabetes, Recurrent upper respiratory infection (URI), and Urticaria. here with    No diagnosis found.    LOXLEY OHERRON is doing well on CPAP therapy. Compliance report reveals excellent compliance. There is a large air leak noted. She was encouraged to change out mask regularly and monitor for worsening leak at home. AHI is well managed. She was encouraged to continue using CPAP nightly and for greater than 4 hours each night. We will update supply orders as indicated. Risks of untreated sleep apnea review and education materials provided. Healthy lifestyle habits encouraged. She will follow up in 1 year, sooner if needed. She verbalizes understanding and agreement with this plan.    No orders of the defined types were placed in this encounter.    No orders of the defined types were placed in this encounter.     Shawnie Dapper, FNP-C 02/10/2023, 4:47 PM [facility] [address] [place], [place] [address] [phone]

## 2023-02-10 NOTE — Patient Instructions (Signed)

## 2023-02-11 ENCOUNTER — Ambulatory Visit (INDEPENDENT_AMBULATORY_CARE_PROVIDER_SITE_OTHER): Payer: BC Managed Care – PPO | Admitting: Family Medicine

## 2023-02-11 ENCOUNTER — Encounter: Payer: Self-pay | Admitting: Family Medicine

## 2023-02-11 VITALS — BP 157/86 | HR 80 | Ht 66.5 in | Wt 187.5 lb

## 2023-02-11 DIAGNOSIS — G4733 Obstructive sleep apnea (adult) (pediatric): Secondary | ICD-10-CM

## 2023-04-01 ENCOUNTER — Ambulatory Visit (INDEPENDENT_AMBULATORY_CARE_PROVIDER_SITE_OTHER): Payer: BC Managed Care – PPO | Admitting: Nurse Practitioner

## 2023-04-01 ENCOUNTER — Other Ambulatory Visit: Payer: Self-pay | Admitting: Nurse Practitioner

## 2023-04-01 ENCOUNTER — Encounter: Payer: Self-pay | Admitting: Nurse Practitioner

## 2023-04-01 VITALS — BP 138/90 | HR 76 | Temp 97.5°F | Ht 66.5 in | Wt 192.2 lb

## 2023-04-01 DIAGNOSIS — R3915 Urgency of urination: Secondary | ICD-10-CM

## 2023-04-01 DIAGNOSIS — E785 Hyperlipidemia, unspecified: Secondary | ICD-10-CM

## 2023-04-01 DIAGNOSIS — R300 Dysuria: Secondary | ICD-10-CM | POA: Diagnosis not present

## 2023-04-01 DIAGNOSIS — B3731 Acute candidiasis of vulva and vagina: Secondary | ICD-10-CM

## 2023-04-01 DIAGNOSIS — Z79899 Other long term (current) drug therapy: Secondary | ICD-10-CM

## 2023-04-01 DIAGNOSIS — R350 Frequency of micturition: Secondary | ICD-10-CM | POA: Diagnosis not present

## 2023-04-01 DIAGNOSIS — E669 Obesity, unspecified: Secondary | ICD-10-CM

## 2023-04-01 DIAGNOSIS — I10 Essential (primary) hypertension: Secondary | ICD-10-CM

## 2023-04-01 MED ORDER — NITROFURANTOIN MONOHYD MACRO 100 MG PO CAPS
100.0000 mg | ORAL_CAPSULE | Freq: Two times a day (BID) | ORAL | 0 refills | Status: AC
Start: 2023-04-01 — End: 2023-04-08

## 2023-04-01 MED ORDER — WEGOVY 0.25 MG/0.5ML ~~LOC~~ SOAJ
0.2500 mg | SUBCUTANEOUS | 1 refills | Status: DC
Start: 2023-04-01 — End: 2023-04-02

## 2023-04-01 MED ORDER — FLUCONAZOLE 150 MG PO TABS
ORAL_TABLET | ORAL | 0 refills | Status: DC
Start: 2023-04-01 — End: 2023-04-27

## 2023-04-01 NOTE — Progress Notes (Signed)
Assessment and Plan:  Natasha Fritz was seen today for an episodic visit.  Diagnoses and all order for this visit:  Dysuria/urgency and frequency of micturation Start prophylactic treatment with abx Stay well hydrated to keep urinary system well flushed Consider daily cranberry juice or oral supplement to help any bacteria from adhering to bladder wall causing increase for infection. Monitor for any increase in fever, chills, N/V, abdominal pain, hematuria.   Contact office or report to ER for further evaluation if s/s fail to improve or any sign of worsening infection as noted above.  - nitrofurantoin, macrocrystal-monohydrate, (MACROBID) 100 MG capsule; Take 1 capsule (100 mg total) by mouth 2 (two) times daily for 7 days.  Dispense: 14 capsule; Refill: 0 - Urinalysis, Routine w reflex microscopic - Urine Culture  Vaginal candida Recommend daily probiotic such as yogurt to help balance natural flora.  - fluconazole (DIFLUCAN) 150 MG tablet; Take 1 tablet (150 mg) at the sign of symptoms.  If not resolved after 72 hours may take additional 150 mg dosage.  Dispense: 3 tablet; Refill: 0  Obesity (BMI 30.0-34.9) Start [place] as directed. Discussed appropriate BMI Diet modification. Physical activity. Encouraged/praised to build confidence.  - Semaglutide-Weight Management (WEGOVY) 0.25 MG/0.5ML SOAJ; Inject 0.25 mg into the skin once a week.  Dispense: 2 mL; Refill: 1  Hyperlipidemia, unspecified hyperlipidemia type Discussed lifestyle modifications. Recommended diet heavy in fruits and veggies, omega 3's. Decrease consumption of animal meats, cheeses, and dairy products. Remain active and exercise as tolerated. Continue to monitor. Check lipids/TSH  - Semaglutide-Weight Management (WEGOVY) 0.25 MG/0.5ML SOAJ; Inject 0.25 mg into the skin once a week.  Dispense: 2 mL; Refill: 1  Essential hypertension Discussed DASH (Dietary Approaches to Stop Hypertension) DASH diet is lower  in sodium than a typical American diet. Cut back on foods that are high in saturated fat, cholesterol, and trans fats. Eat more whole-grain foods, fish, poultry, and nuts Remain active and exercise as tolerated daily.  Monitor BP at home-Call if greater than 130/80.  Check CMP/CBC  - Semaglutide-Weight Management (WEGOVY) 0.25 MG/0.5ML SOAJ; Inject 0.25 mg into the skin once a week.  Dispense: 2 mL; Refill: 1  Medication management All medications discussed and reviewed in full. All questions and concerns regarding medications addressed.     Notify office for further evaluation and treatment, questions or concerns if s/s fail to improve. The risks and benefits of my recommendations, as well as other treatment options were discussed with the patient today. Questions were answered.  Further disposition pending results of labs. Discussed med's effects and SE's.    Over 20 minutes of exam, counseling, chart review, and critical decision making was performed.   Future Appointments  Date Time Provider Department Center  07/20/2023  9:00 AM Adela Glimpse, NP [id number] None  02/17/2024  1:00 PM Lomax, Amy, NP [facility] None    ------------------------------------------------------------------------------------------------------------------   HPI BP (!) 138/90   Pulse 76   Temp (!) 97.5 F (36.4 C)   Ht 5' 6.5" (1.689 m)   Wt 192 lb 3.2 oz (87.2 kg)   LMP 09/05/2012   SpO2 97%   BMI 30.56 kg/m    Patient complains of dysuria, frequency, and urgency. She has had symptoms for 4 days. Patient also complains of vaginal discharge with itching. Patient denies back pain, fever, and stomach ache. Patient does not have a history of recurrent UTI. Patient does not have a history of pyelonephritis.   She continues to be concerned with  weight gain.  BMI is Body mass index is 30.56 kg/m., she has been working on diet and exercise and unsuccessful at losing weight.  Cannot start Phentermine due  to elevated BP. Wt Readings from Last 3 Encounters:  04/01/23 192 lb 3.2 oz (87.2 kg)  02/11/23 187 lb 8 oz (85 kg)  01/19/23 187 lb (84.8 kg)   She reports compliance with BP medications. Her BP is currently: BP Readings from Last 3 Encounters:  04/01/23 (!) 138/90  02/11/23 (!) 157/86  01/19/23 138/87   She is on cholesterol medication Zetia and denies myalgias. Her cholesterol is not at goal. The cholesterol last visit was:   Lab Results  Component Value Date   CHOL 173 01/14/2023   HDL 39 (L) 01/14/2023   LDLCALC 102 (H) 01/14/2023   TRIG 204 (H) 01/14/2023   CHOLHDL 4.4 01/14/2023   Past Medical History:  Diagnosis Date   Achilles tendinitis    Acid reflux 07/09/2020   Anemia    Anxiety    Basal cell carcinoma of nose 05/23/2015   Endometrial hyperplasia without atypia, simple 09/10/2012   Endometrial hyperplasia without atypia, simple 09/10/2012   HSV-1 infection    HTN (hypertension)    Hyperlipidemia    Migraine    PONV (postoperative nausea and vomiting)    Pre-diabetes    Recurrent upper respiratory infection (URI)    Urticaria      Allergies  Allergen Reactions   Penicillins Hives    REACTION: hives reaction seen in 1993 per patient hives were "all over" no SOB, no facial swelling, no dizziness / syncope   Pravastatin Other (See Comments)    myalgias    Current Outpatient Medications on File Prior to Visit  Medication Sig   ALPRAZolam (XANAX) 0.5 MG tablet TAKE 1 TABLET BY MOUTH 2 TIMES DAILY AS NEEDED (Patient taking differently: Take 0.5 mg by mouth daily as needed for anxiety or sleep.)   aspirin 81 MG EC tablet Take 81 mg by mouth daily.   azelastine (ASTELIN) 0.1 % nasal spray Place 1 spray into both nostrils 2 (two) times daily. Use in each nostril as directed (Patient taking differently: Place 1 spray into both nostrils 2 (two) times daily as needed for allergies. Use in each nostril as directed)   benzonatate (TESSALON PERLES) 100 MG capsule  Take 1 capsule (100 mg total) by mouth every 6 (six) hours as needed for cough.   cetirizine (ZYRTEC ALLERGY) 10 MG tablet Take 1 tablet (10 mg total) by mouth daily as needed for allergies. (Patient taking differently: Take 10 mg by mouth daily.)   CHELATED MAGNESIUM PO Take 72 mg by mouth daily.   cholecalciferol (VITAMIN D) 1000 UNITS tablet Take 1,000 Units by mouth daily.   ezetimibe (ZETIA) 10 MG tablet Take 1 tablet (10 mg total) by mouth daily.   fluocinonide cream (LIDEX) 0.05 % Apply 1 application  topically daily as needed (itching).   fluticasone (FLONASE) 50 MCG/ACT nasal spray Place 2 sprays into both nostrils daily.   ketoconazole (NIZORAL) 2 % cream Apply 1 application  topically daily as needed for irritation.   meloxicam (MOBIC) 15 MG tablet Take 1 tab daily with food for 2-4 weeks then daily as needed. Do not take with ibuprofen or aleve.   Multiple Vitamin (MULTIVITAMIN) tablet Take 1 tablet by mouth daily.   olmesartan (BENICAR) 40 MG tablet TAKE 1 TABLET DAILY FOR BLOOD PRESSURE (Patient taking differently: Take 20 mg by mouth daily.)  Omega 3-6-9 Fatty Acids (OMEGA 3-6-9 PO) Take 3,600 mg by mouth daily.   ondansetron (ZOFRAN) 4 MG tablet Take 1 tablet (4 mg total) by mouth every 8 (eight) hours as needed for nausea or vomiting.   oxyCODONE-acetaminophen (PERCOCET/ROXICET) 5-325 MG tablet Take 1 tablet by mouth every 6 (six) hours as needed for severe pain.   promethazine-dextromethorphan (PROMETHAZINE-DM) 6.25-15 MG/5ML syrup Take 5 mLs by mouth 4 (four) times daily as needed for cough.   Pseudoephedrine HCl (SUDAFED PO) Take 1 tablet by mouth daily as needed (Sinus).   valACYclovir (VALTREX) 500 MG tablet Take     1 tablet     Daily  -  prophylaxis for Cold Sores / Fever Blisters (Patient taking differently: Take 500 mg by mouth as needed (prophylaxis for Cold Sores / Fever Blisters).)   venlafaxine XR (EFFEXOR-XR) 37.5 MG 24 hr capsule Take 37.5 mg by mouth daily. For hot  flashes.   No current facility-administered medications on file prior to visit.    ROS: all negative except what is noted in the HPI.   Physical Exam:  BP (!) 138/90   Pulse 76   Temp (!) 97.5 F (36.4 C)   Ht 5' 6.5" (1.689 m)   Wt 192 lb 3.2 oz (87.2 kg)   LMP 09/05/2012   SpO2 97%   BMI 30.56 kg/m   General Appearance: NAD.  Awake, conversant and cooperative. Eyes: PERRLA, EOMs intact.  Sclera white.  Conjunctiva without erythema. Sinuses: No frontal/maxillary tenderness.  No nasal discharge. Nares patent.  ENT/Mouth: Ext aud canals clear.  Bilateral TMs w/DOL and without erythema or bulging. Hearing intact.  Posterior pharynx without swelling or exudate.  Tonsils without swelling or erythema.  Neck: Supple.  No masses, nodules or thyromegaly. Respiratory: Effort is regular with non-labored breathing. Breath sounds are equal bilaterally without rales, rhonchi, wheezing or stridor.  Cardio: RRR with no MRGs. Brisk peripheral pulses without edema.  Abdomen: Active BS in all four quadrants.  Soft and non-tender without guarding, rebound tenderness, hernias or masses. Lymphatics: Non tender without lymphadenopathy.  Musculoskeletal: Full ROM, 5/5 strength, normal ambulation.  No clubbing or cyanosis. Skin: Appropriate color for ethnicity. Warm without rashes, lesions, ecchymosis, ulcers.  Neuro: CN II-XII grossly normal. Normal muscle tone without cerebellar symptoms and intact sensation.   Psych: AO X 3,  appropriate mood and affect, insight and judgment.     Adela Glimpse, NP 4:00 PM [facility]

## 2023-04-01 NOTE — Patient Instructions (Signed)

## 2023-04-02 ENCOUNTER — Other Ambulatory Visit: Payer: Self-pay

## 2023-04-02 DIAGNOSIS — E785 Hyperlipidemia, unspecified: Secondary | ICD-10-CM

## 2023-04-02 DIAGNOSIS — E669 Obesity, unspecified: Secondary | ICD-10-CM

## 2023-04-02 DIAGNOSIS — I10 Essential (primary) hypertension: Secondary | ICD-10-CM

## 2023-04-02 MED ORDER — WEGOVY 0.25 MG/0.5ML ~~LOC~~ SOAJ
0.2500 mg | SUBCUTANEOUS | 1 refills | Status: DC
Start: 2023-04-02 — End: 2023-04-22

## 2023-04-03 LAB — URINALYSIS, ROUTINE W REFLEX MICROSCOPIC
Bacteria, UA: NONE SEEN /HPF
Bilirubin Urine: NEGATIVE
Glucose, UA: NEGATIVE
Hyaline Cast: NONE SEEN /LPF
Ketones, ur: NEGATIVE
Nitrite: NEGATIVE
Protein, ur: NEGATIVE
RBC / HPF: NONE SEEN /HPF (ref 0–2)
Specific Gravity, Urine: 1.005 (ref 1.001–1.035)
Squamous Epithelial / HPF: NONE SEEN /HPF (ref <=5)
pH: 6 (ref 5.0–8.0)

## 2023-04-03 LAB — URINE CULTURE
MICRO NUMBER:: 15183529
SPECIMEN QUALITY:: ADEQUATE

## 2023-04-03 LAB — MICROSCOPIC MESSAGE

## 2023-04-09 DIAGNOSIS — Z01419 Encounter for gynecological examination (general) (routine) without abnormal findings: Secondary | ICD-10-CM | POA: Diagnosis not present

## 2023-04-16 ENCOUNTER — Other Ambulatory Visit: Payer: Self-pay | Admitting: Nurse Practitioner

## 2023-04-22 ENCOUNTER — Other Ambulatory Visit: Payer: Self-pay | Admitting: Nurse Practitioner

## 2023-04-22 DIAGNOSIS — E785 Hyperlipidemia, unspecified: Secondary | ICD-10-CM

## 2023-04-22 DIAGNOSIS — E669 Obesity, unspecified: Secondary | ICD-10-CM

## 2023-04-22 DIAGNOSIS — I10 Essential (primary) hypertension: Secondary | ICD-10-CM

## 2023-04-24 ENCOUNTER — Telehealth: Payer: Self-pay | Admitting: Nurse Practitioner

## 2023-04-24 NOTE — Telephone Encounter (Signed)
Pt says her shoulder has been hurting thr past week. I figured you would want her to come in to futher assess... but she was wondering if you could send her in a round of Meloxicam or Prednisone. If so, pls send to [facility].

## 2023-04-27 ENCOUNTER — Ambulatory Visit: Payer: BC Managed Care – PPO | Admitting: Nurse Practitioner

## 2023-04-27 ENCOUNTER — Ambulatory Visit
Admission: RE | Admit: 2023-04-27 | Discharge: 2023-04-27 | Disposition: A | Discharge status: Home / Self Care | Payer: BC Managed Care – PPO | Source: Ambulatory Visit | Attending: Nurse Practitioner | Admitting: Nurse Practitioner

## 2023-04-27 ENCOUNTER — Encounter: Payer: Self-pay | Admitting: Nurse Practitioner

## 2023-04-27 VITALS — BP 132/78 | HR 73 | Temp 97.5°F | Ht 66.5 in | Wt 190.0 lb

## 2023-04-27 DIAGNOSIS — M25512 Pain in left shoulder: Secondary | ICD-10-CM | POA: Diagnosis not present

## 2023-04-27 DIAGNOSIS — I10 Essential (primary) hypertension: Secondary | ICD-10-CM | POA: Diagnosis not present

## 2023-04-27 MED ORDER — PREDNISONE 20 MG PO TABS
ORAL_TABLET | ORAL | 0 refills | Status: AC
Start: 2023-04-27 — End: 2023-05-08

## 2023-04-27 MED ORDER — CYCLOBENZAPRINE HCL 5 MG PO TABS
5.0000 mg | ORAL_TABLET | Freq: Three times a day (TID) | ORAL | 0 refills | Status: DC | PRN
Start: 2023-04-27 — End: 2023-06-17

## 2023-04-27 MED ORDER — MELOXICAM 15 MG PO TABS: ORAL_TABLET | ORAL | 1 refills | Status: DC

## 2023-04-27 MED ORDER — OLMESARTAN MEDOXOMIL 40 MG PO TABS
ORAL_TABLET | ORAL | Status: DC
Start: 2023-04-27 — End: 2024-04-21

## 2023-04-27 NOTE — Patient Instructions (Signed)
Shoulder Pain Many things can cause shoulder pain, including: An injury. Moving the shoulder in the same way again and again (overuse). Joint pain (arthritis). Pain can come from: Swelling and irritation (inflammation) of any part of the shoulder. An injury to: The shoulder joint. Tissues that connect muscle to bone (tendons). Tissues that connect bones to each other (ligaments). Bones. Follow these instructions at home: Watch for changes in your symptoms. Let your doctor know about them. Follow these instructions to help with your pain. If you have a sling that can be taken off: Wear the sling as told by your doctor. Take it off only as told by your doctor. Check the skin around the sling every day. Tell your doctor if you see problems. Loosen the sling if your fingers: Tingle. Become numb. Become cold. Keep the sling clean. If the sling is not waterproof: Do not let it get wet. Take the sling off when you shower or bathe. Managing pain, stiffness, and swelling  If told, put ice on the painful area. Put ice in a plastic bag. Place a towel between your skin and the bag. Leave the ice on for 20 minutes, 2-3 times a day. Stop putting ice on if it does not help with the pain. If your skin turns bright red, take off the ice right away to prevent skin damage. The risk of damage is higher if you cannot feel pain, heat, or cold. Squeeze a soft ball or a foam pad as much as possible. This prevents swelling in the shoulder. It also helps to strengthen the arm. General instructions Take over-the-counter and prescription medicines only as told by your doctor. Keep all follow-up visits. This will help you avoid any type of permanent shoulder problems. Contact a doctor if: Your pain gets worse. Medicine does not help your pain. You have new pain in your arm, hand, or fingers. You loosen your sling and your arm, hand, or fingers: Tingle. Are numb. Are swollen. Get help right away  if: Your arm, hand, or fingers turn white or blue. This information is not intended to replace advice given to you by your health care provider. Make sure you discuss any questions you have with your health care provider. Document Revised: 04/11/2022 Document Reviewed: 04/11/2022 Elsevier Patient Education  2024 Elsevier Inc.  

## 2023-04-27 NOTE — Progress Notes (Signed)
Assessment and Plan:  Natasha Fritz was seen today for shoulder pain.  Diagnoses and all orders for this visit:  Essential hypertension - continue medications- Olmesartan 40 mg 1/2 tab daily, DASH diet, exercise and monitor at home. Call if greater than 130/80.   Acute pain of left shoulder Begin prednisone taper  Use  meloxicam 15 mg every day x 7-10 days, if no improvement in symptoms and xray is normal will refer to orthopedics Use ice and heat Use Flexeril as needed and recommend massage for muscle tension of neck and shoulders. -     predniSONE (DELTASONE) 20 MG tablet; 3 tablets daily with food for 3 days, 2 tabs daily for 3 days, 1 tab a day for 5 days. -     DG Shoulder Left; Future -     meloxicam (MOBIC) 15 MG tablet; Take 1 tab daily with food for 2-4 weeks then daily as needed. Do not take with ibuprofen or aleve. -     cyclobenzaprine (FLEXERIL) 5 MG tablet; Take 1 tablet (5 mg total) by mouth 3 (three) times daily as needed for muscle spasms.       Further disposition pending results of labs. Discussed med's effects and SE's.   Over 30 minutes of exam, counseling, chart review, and critical decision making was performed.   Future Appointments  Date Time Provider Department Center  07/20/2023  9:00 AM Adela Glimpse, NP [facility] None  02/17/2024  1:00 PM Lomax, Amy, NP [facility] None    ------------------------------------------------------------------------------------------------------------------   HPI BP 132/78   Pulse 73   Temp (!) 97.5 F (36.4 C)   Ht 5' 6.5" (1.689 m)   Wt 190 lb (86.2 kg)   LMP 09/05/2012   SpO2 95%   BMI 30.21 kg/m  62 y.o.female presents for left shoulder pain. 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 The right shoulder pain has improved but left shoulder remains painful x 1 week She  describes the pain as initially aching in both shoulders.  Now throbbing pain in left shoulder and neck. The pain will radiate down her left arm.   BP well controlled at home at 120's/80's with Olmesartan 40 mg 1/2 tab daily  BP Readings from Last 3 Encounters:  04/27/23 132/78  04/01/23 (!) 138/90  02/11/23 (!) 157/86  Denies headaches, chest pain, shortness of breath and dizziness    BMI is Body mass index is 30.21 kg/m., she has not been working on diet and exercise. Currently on Wegovy 0.5 mg SQ QW  Wt Readings from Last 3 Encounters:  04/27/23 190 lb (86.2 kg)  04/01/23 192 lb 3.2 oz (87.2 kg)  02/11/23 187 lb 8 oz (85 kg)    Past Medical History:  Diagnosis Date   Achilles tendinitis    Acid reflux 07/09/2020   Anemia    Anxiety    Basal cell carcinoma of nose 05/23/2015   Endometrial hyperplasia without atypia, simple 09/10/2012   Endometrial hyperplasia without atypia, simple 09/10/2012   HSV-1 infection    HTN (hypertension)    Hyperlipidemia    Migraine    PONV (postoperative nausea and vomiting)    Pre-diabetes    Recurrent upper respiratory infection (URI)    Urticaria      Allergies  Allergen Reactions   Penicillins Hives    REACTION: hives reaction  seen in 1993 per patient hives were "all over" no SOB, no facial swelling, no dizziness / syncope   Pravastatin Other (See Comments)    myalgias    Current Outpatient Medications on File Prior to Visit  Medication Sig   ALPRAZolam (XANAX) 0.5 MG tablet TAKE 1 TABLET BY MOUTH 2 TIMES DAILY AS NEEDED (Patient taking differently: Take 0.5 mg by mouth daily as needed for anxiety or sleep.)   aspirin 81 MG EC tablet Take 81 mg by mouth daily.   azelastine (ASTELIN) 0.1 % nasal spray Place 1 spray into both nostrils 2 (two) times daily. Use in each nostril as directed (Patient taking differently: Place 1 spray into both nostrils 2 (two) times daily as needed for allergies. Use in each nostril as directed)    cetirizine (ZYRTEC ALLERGY) 10 MG tablet Take 1 tablet (10 mg total) by mouth daily as needed for allergies. (Patient taking differently: Take 10 mg by mouth daily.)   CHELATED MAGNESIUM PO Take 72 mg by mouth daily.   cholecalciferol (VITAMIN D) 1000 UNITS tablet Take 1,000 Units by mouth daily.   ezetimibe (ZETIA) 10 MG tablet Take 1 tablet (10 mg total) by mouth daily.   fluocinonide cream (LIDEX) 0.05 % Apply 1 application  topically daily as needed (itching).   fluticasone (FLONASE) 50 MCG/ACT nasal spray Place 2 sprays into both nostrils daily.   ketoconazole (NIZORAL) 2 % cream Apply 1 application  topically daily as needed for irritation.   Multiple Vitamin (MULTIVITAMIN) tablet Take 1 tablet by mouth daily.   olmesartan (BENICAR) 40 MG tablet TAKE 1 TABLET DAILY FOR BLOOD PRESSURE   Omega 3-6-9 Fatty Acids (OMEGA 3-6-9 PO) Take 3,600 mg by mouth daily.   ondansetron (ZOFRAN) 4 MG tablet Take 1 tablet (4 mg total) by mouth every 8 (eight) hours as needed for nausea or vomiting.   valACYclovir (VALTREX) 500 MG tablet Take     1 tablet     Daily  -  prophylaxis for Cold Sores / Fever Blisters   venlafaxine XR (EFFEXOR-XR) 37.5 MG 24 hr capsule Take 37.5 mg by mouth daily. For hot flashes.   WEGOVY 0.5 MG/0.5ML SOAJ INJECT 0.5mg  SUBCUTANEOUSLY ONCE WEEKLY.   benzonatate (TESSALON PERLES) 100 MG capsule Take 1 capsule (100 mg total) by mouth every 6 (six) hours as needed for cough. (Patient not taking: Reported on 04/27/2023)   fluconazole (DIFLUCAN) 150 MG tablet Take 1 tablet (150 mg) at the sign of symptoms.  If not resolved after 72 hours may take additional 150 mg dosage. (Patient not taking: Reported on 04/27/2023)   oxyCODONE-acetaminophen (PERCOCET/ROXICET) 5-325 MG tablet Take 1 tablet by mouth every 6 (six) hours as needed for severe pain. (Patient not taking: Reported on 04/27/2023)   promethazine-dextromethorphan (PROMETHAZINE-DM) 6.25-15 MG/5ML syrup Take 5 mLs by mouth 4 (four) times  daily as needed for cough. (Patient not taking: Reported on 04/27/2023)   Pseudoephedrine HCl (SUDAFED PO) Take 1 tablet by mouth daily as needed (Sinus). (Patient not taking: Reported on 04/27/2023)   No current facility-administered medications on file prior to visit.    ROS: all negative except above.   Physical Exam:  BP 132/78   Pulse 73   Temp (!) 97.5 F (36.4 C)   Ht 5' 6.5" (1.689 m)   Wt 190 lb (86.2 kg)   LMP 09/05/2012   SpO2 95%   BMI 30.21 kg/m   General Appearance: Well nourished, in no apparent distress. Eyes: PERRLA, EOMs, conjunctiva no  swelling or erythema Sinuses: No Frontal/maxillary tenderness ENT/Mouth: Ext aud canals clear, TMs without erythema, bulging. No erythema, swelling, or exudate on post pharynx.  Tonsils not swollen or erythematous. Hearing normal.  Neck: Supple, thyroid normal.  Respiratory: Respiratory effort normal, BS equal bilaterally without rales, rhonchi, wheezing or stridor.  Cardio: RRR with no MRGs. Brisk peripheral pulses without edema.  Abdomen: Soft, + BS.  Non tender, no guarding, rebound, hernias, masses. Lymphatics: Non tender without lymphadenopathy.  Musculoskeletal: Full ROM, 5/5 strength, normal gait. Tenderness left shoulder lateral aspect. Left shoulder pain when moving laterally and behind her back Marked muscle tension of neck and shoulders Skin: Warm, dry without rashes, lesions, ecchymosis.  Neuro: Cranial nerves intact. Normal muscle tone, no cerebellar symptoms. Sensation intact.  Psych: Awake and oriented X 3, normal affect, Insight and Judgment appropriate.     Raynelle Dick, NP 12:21 PM [facility]

## 2023-05-12 DIAGNOSIS — M542 Cervicalgia: Secondary | ICD-10-CM | POA: Diagnosis not present

## 2023-05-18 DIAGNOSIS — M542 Cervicalgia: Secondary | ICD-10-CM | POA: Diagnosis not present

## 2023-05-18 DIAGNOSIS — M5031 Other cervical disc degeneration,  high cervical region: Secondary | ICD-10-CM | POA: Diagnosis not present

## 2023-05-18 DIAGNOSIS — M50321 Other cervical disc degeneration at C4-C5 level: Secondary | ICD-10-CM | POA: Diagnosis not present

## 2023-05-18 DIAGNOSIS — M50222 Other cervical disc displacement at C5-C6 level: Secondary | ICD-10-CM | POA: Diagnosis not present

## 2023-05-18 DIAGNOSIS — G4733 Obstructive sleep apnea (adult) (pediatric): Secondary | ICD-10-CM | POA: Diagnosis not present

## 2023-05-18 DIAGNOSIS — M4312 Spondylolisthesis, cervical region: Secondary | ICD-10-CM | POA: Diagnosis not present

## 2023-05-20 DIAGNOSIS — M5412 Radiculopathy, cervical region: Secondary | ICD-10-CM | POA: Diagnosis not present

## 2023-05-23 ENCOUNTER — Other Ambulatory Visit: Payer: Self-pay | Admitting: Nurse Practitioner

## 2023-05-23 DIAGNOSIS — E785 Hyperlipidemia, unspecified: Secondary | ICD-10-CM

## 2023-05-23 DIAGNOSIS — I10 Essential (primary) hypertension: Secondary | ICD-10-CM

## 2023-05-23 DIAGNOSIS — E669 Obesity, unspecified: Secondary | ICD-10-CM

## 2023-05-26 ENCOUNTER — Other Ambulatory Visit: Payer: Self-pay | Admitting: Nurse Practitioner

## 2023-05-26 DIAGNOSIS — E785 Hyperlipidemia, unspecified: Secondary | ICD-10-CM

## 2023-05-26 DIAGNOSIS — I10 Essential (primary) hypertension: Secondary | ICD-10-CM

## 2023-05-26 DIAGNOSIS — E669 Obesity, unspecified: Secondary | ICD-10-CM

## 2023-05-27 ENCOUNTER — Other Ambulatory Visit: Payer: Self-pay | Admitting: Nurse Practitioner

## 2023-05-27 DIAGNOSIS — M5412 Radiculopathy, cervical region: Secondary | ICD-10-CM | POA: Diagnosis not present

## 2023-05-27 DIAGNOSIS — M25512 Pain in left shoulder: Secondary | ICD-10-CM

## 2023-06-01 DIAGNOSIS — M5412 Radiculopathy, cervical region: Secondary | ICD-10-CM | POA: Diagnosis not present

## 2023-06-03 DIAGNOSIS — M5412 Radiculopathy, cervical region: Secondary | ICD-10-CM | POA: Diagnosis not present

## 2023-06-08 DIAGNOSIS — M5412 Radiculopathy, cervical region: Secondary | ICD-10-CM | POA: Diagnosis not present

## 2023-06-10 DIAGNOSIS — M5412 Radiculopathy, cervical region: Secondary | ICD-10-CM | POA: Diagnosis not present

## 2023-06-15 DIAGNOSIS — M5412 Radiculopathy, cervical region: Secondary | ICD-10-CM | POA: Diagnosis not present

## 2023-06-16 ENCOUNTER — Other Ambulatory Visit (HOSPITAL_COMMUNITY): Payer: Self-pay

## 2023-06-17 ENCOUNTER — Encounter: Payer: Self-pay | Admitting: Nurse Practitioner

## 2023-06-17 ENCOUNTER — Other Ambulatory Visit: Payer: Self-pay | Admitting: Nurse Practitioner

## 2023-06-17 ENCOUNTER — Ambulatory Visit (INDEPENDENT_AMBULATORY_CARE_PROVIDER_SITE_OTHER): Payer: BC Managed Care – PPO | Admitting: Nurse Practitioner

## 2023-06-17 VITALS — BP 138/88 | HR 76 | Temp 97.8°F | Ht 66.5 in | Wt 183.6 lb

## 2023-06-17 DIAGNOSIS — R8290 Unspecified abnormal findings in urine: Secondary | ICD-10-CM

## 2023-06-17 DIAGNOSIS — I10 Essential (primary) hypertension: Secondary | ICD-10-CM

## 2023-06-17 DIAGNOSIS — B3731 Acute candidiasis of vulva and vagina: Secondary | ICD-10-CM

## 2023-06-17 DIAGNOSIS — R3915 Urgency of urination: Secondary | ICD-10-CM | POA: Diagnosis not present

## 2023-06-17 DIAGNOSIS — M25512 Pain in left shoulder: Secondary | ICD-10-CM

## 2023-06-17 DIAGNOSIS — R300 Dysuria: Secondary | ICD-10-CM | POA: Diagnosis not present

## 2023-06-17 DIAGNOSIS — E669 Obesity, unspecified: Secondary | ICD-10-CM

## 2023-06-17 DIAGNOSIS — R350 Frequency of micturition: Secondary | ICD-10-CM

## 2023-06-17 DIAGNOSIS — E66811 Obesity, class 1: Secondary | ICD-10-CM

## 2023-06-17 DIAGNOSIS — E785 Hyperlipidemia, unspecified: Secondary | ICD-10-CM

## 2023-06-17 MED ORDER — CIPROFLOXACIN HCL 250 MG PO TABS
ORAL_TABLET | ORAL | 0 refills | Status: DC
Start: 2023-06-17 — End: 2023-07-14

## 2023-06-17 MED ORDER — FLUCONAZOLE 150 MG PO TABS
ORAL_TABLET | ORAL | 0 refills | Status: DC
Start: 2023-06-17 — End: 2023-08-11

## 2023-06-17 MED ORDER — CYCLOBENZAPRINE HCL 5 MG PO TABS
5.0000 mg | ORAL_TABLET | Freq: Three times a day (TID) | ORAL | 0 refills | Status: AC | PRN
Start: 2023-06-17 — End: ?

## 2023-06-17 NOTE — Patient Instructions (Signed)
Urinary Tract Infection, Adult A urinary tract infection (UTI) is an infection of any part of the urinary tract. The urinary tract includes: The kidneys. The ureters. The bladder. The urethra. These organs make, store, and get rid of pee (urine) in the body. What are the causes? This infection is caused by germs (bacteria) in your genital area. These germs grow and cause swelling (inflammation) of your urinary tract. What increases the risk? The following factors may make you more likely to develop this condition: Using a small, thin tube (catheter) to drain pee. Not being able to control when you pee or poop (incontinence). Being female. If you are female, these things can increase the risk: Using these methods to prevent pregnancy: A medicine that kills sperm (spermicide). A device that blocks sperm (diaphragm). Having low levels of a female hormone (estrogen). Being pregnant. You are more likely to develop this condition if: You have genes that add to your risk. You are sexually active. You take antibiotic medicines. You have trouble peeing because of: A prostate that is bigger than normal, if you are female. A blockage in the part of your body that drains pee from the bladder. A kidney stone. A nerve condition that affects your bladder. Not getting enough to drink. Not peeing often enough. You have other conditions, such as: Diabetes. A weak disease-fighting system (immune system). Sickle cell disease. Gout. Injury of the spine. What are the signs or symptoms? Symptoms of this condition include: Needing to pee right away. Peeing small amounts often. Pain or burning when peeing. Blood in the pee. Pee that smells bad or not like normal. Trouble peeing. Pee that is cloudy. Fluid coming from the vagina, if you are female. Pain in the belly or lower back. Other symptoms include: Vomiting. Not feeling hungry. Feeling mixed up (confused). This may be the first symptom in  older adults. Being tired and grouchy (irritable). A fever. Watery poop (diarrhea). How is this treated? Taking antibiotic medicine. Taking other medicines. Drinking enough water. In some cases, you may need to see a specialist. Follow these instructions at home:  Medicines Take over-the-counter and prescription medicines only as told by your doctor. If you were prescribed an antibiotic medicine, take it as told by your doctor. Do not stop taking it even if you start to feel better. General instructions Make sure you: Pee until your bladder is empty. Do not hold pee for a long time. Empty your bladder after sex. Wipe from front to back after peeing or pooping if you are a female. Use each tissue one time when you wipe. Drink enough fluid to keep your pee pale yellow. Keep all follow-up visits. Contact a doctor if: You do not get better after 1-2 days. Your symptoms go away and then come back. Get help right away if: You have very bad back pain. You have very bad pain in your lower belly. You have a fever. You have chills. You feeling like you will vomit or you vomit. Summary A urinary tract infection (UTI) is an infection of any part of the urinary tract. This condition is caused by germs in your genital area. There are many risk factors for a UTI. Treatment includes antibiotic medicines. Drink enough fluid to keep your pee pale yellow. This information is not intended to replace advice given to you by your health care provider. Make sure you discuss any questions you have with your health care provider. Document Revised: 04/15/2020 Document Reviewed: 04/20/2020 Elsevier Patient Education    2024 Elsevier Inc.  

## 2023-06-17 NOTE — Progress Notes (Signed)
Assessment and Plan:  Natasha Fritz was seen today for a episodic visit.  Diagnoses and all order for this visit:  Dysuria Stay well hydrated to keep urinary system well flushed Consider daily cranberry juice or oral supplement to help any bacteria from adhering to bladder wall causing increase for infection. Monitor for any increase in fever, chills, N/V, abdominal pain, hematuria.   Contact office or report to ER for further evaluation if s/s fail to improve or any sign of worsening infection as noted above.  - Urinalysis, Routine w reflex microscopic - Urine Culture - ciprofloxacin (CIPRO) 250 MG tablet; Take 1 tablet 2 x /day with Food for Infection  Dispense: 14 tablet; Refill: 0  Urinary frequency/urgency of micturition/malodorous urine  - Urinalysis, Routine w reflex microscopic - Urine Culture  Vaginal candida Discussed taking in daily probiotic such as yogurt to help balance vaginal and gut flora. Start Diflucan as directed.   - fluconazole (DIFLUCAN) 150 MG tablet; Take 1 tablet (150 mg) at the sign of symptoms.  If not resolved after 72 hours may take additional 150 mg dosage.  Dispense: 3 tablet; Refill: 0    Notify office for further evaluation and treatment, questions or concerns if s/s fail to improve. The risks and benefits of my recommendations, as well as other treatment options were discussed with the patient today. Questions were answered.  Further disposition pending results of labs. Discussed med's effects and SE's.    Over 15 minutes of exam, counseling, chart review, and critical decision making was performed.   Future Appointments  Date Time Provider Department Center  07/20/2023  9:00 AM Adela Glimpse, NP [facility] None  02/17/2024  1:00 PM Lomax, Amy, NP [facility] None    ------------------------------------------------------------------------------------------------------------------   HPI BP 138/88   Pulse 76   Temp 97.8 F (36.6 C)   Ht 5'  6.5" (1.689 m)   Wt 183 lb 9.6 oz (83.3 kg)   LMP 09/05/2012   SpO2 98%   BMI 29.19 kg/m    Patient complains of abnormal smelling urine, dysuria, frequency, and urgency. Associated vaginal discharge with pruritis.  She has had symptoms for 2 weeks.  Tried to treat at home with OTC remedies, AZO and cranberry but not improvement.  Patient denies back pain, fever, headache, stomach ache, and vaginal discharge. Patient does have a history of recurrent UTI. Patient does not have a history of pyelonephritis.    Past Medical History:  Diagnosis Date   Achilles tendinitis    Acid reflux 07/09/2020   Anemia    Anxiety    Basal cell carcinoma of nose 05/23/2015   Endometrial hyperplasia without atypia, simple 09/10/2012   Endometrial hyperplasia without atypia, simple 09/10/2012   HSV-1 infection    HTN (hypertension)    Hyperlipidemia    Migraine    PONV (postoperative nausea and vomiting)    Pre-diabetes    Recurrent upper respiratory infection (URI)    Urticaria      Allergies  Allergen Reactions   Penicillins Hives    REACTION: hives reaction seen in 1993 per patient hives were "all over" no SOB, no facial swelling, no dizziness / syncope   Pravastatin Other (See Comments)    myalgias    Current Outpatient Medications on File Prior to Visit  Medication Sig   ALPRAZolam (XANAX) 0.5 MG tablet TAKE 1 TABLET BY MOUTH 2 TIMES DAILY AS NEEDED (Patient taking differently: Take 0.5 mg by mouth daily as needed for anxiety or sleep.)  aspirin 81 MG EC tablet Take 81 mg by mouth daily.   azelastine (ASTELIN) 0.1 % nasal spray Place 1 spray into both nostrils 2 (two) times daily. Use in each nostril as directed (Patient taking differently: Place 1 spray into both nostrils 2 (two) times daily as needed for allergies. Use in each nostril as directed)   cetirizine (ZYRTEC ALLERGY) 10 MG tablet Take 1 tablet (10 mg total) by mouth daily as needed for allergies. (Patient taking differently:  Take 10 mg by mouth daily.)   CHELATED MAGNESIUM PO Take 72 mg by mouth daily.   cholecalciferol (VITAMIN D) 1000 UNITS tablet Take 1,000 Units by mouth daily.   cyclobenzaprine (FLEXERIL) 5 MG tablet Take 1 tablet (5 mg total) by mouth 3 (three) times daily as needed for muscle spasms.   ezetimibe (ZETIA) 10 MG tablet Take 1 tablet (10 mg total) by mouth daily.   fluocinonide cream (LIDEX) 0.05 % Apply 1 application  topically daily as needed (itching).   fluticasone (FLONASE) 50 MCG/ACT nasal spray Place 2 sprays into both nostrils daily.   ketoconazole (NIZORAL) 2 % cream Apply 1 application  topically daily as needed for irritation.   meloxicam (MOBIC) 15 MG tablet TAKE 1 TABLET EVERY OTHER DAY AS NEEDED FOR PAIN Do not take with ibuprofen OR aleve   Multiple Vitamin (MULTIVITAMIN) tablet Take 1 tablet by mouth daily.   olmesartan (BENICAR) 40 MG tablet TAKE 1/2 TABLET DAILY FOR BLOOD PRESSURE   Omega 3-6-9 Fatty Acids (OMEGA 3-6-9 PO) Take 3,600 mg by mouth daily.   ondansetron (ZOFRAN) 4 MG tablet Take 1 tablet (4 mg total) by mouth every 8 (eight) hours as needed for nausea or vomiting.   oxyCODONE-acetaminophen (PERCOCET/ROXICET) 5-325 MG tablet Take 1 tablet by mouth every 6 (six) hours as needed for severe pain.   valACYclovir (VALTREX) 500 MG tablet Take     1 tablet     Daily  -  prophylaxis for Cold Sores / Fever Blisters   venlafaxine XR (EFFEXOR-XR) 37.5 MG 24 hr capsule Take 37.5 mg by mouth daily. For hot flashes.   WEGOVY 1 MG/0.5ML SOAJ INJECT 0.46ml SUBCUTANEOUSLY ONCE WEEKLY.   No current facility-administered medications on file prior to visit.    ROS: all negative except what is noted in the HPI.   Physical Exam:  BP 138/88   Pulse 76   Temp 97.8 F (36.6 C)   Ht 5' 6.5" (1.689 m)   Wt 183 lb 9.6 oz (83.3 kg)   LMP 09/05/2012   SpO2 98%   BMI 29.19 kg/m   General Appearance: NAD.  Awake, conversant and cooperative. Eyes: PERRLA, EOMs intact.  Sclera white.   Conjunctiva without erythema. Sinuses: No frontal/maxillary tenderness.  No nasal discharge. Nares patent.  ENT/Mouth: Ext aud canals clear.  Bilateral TMs w/DOL and without erythema or bulging. Hearing intact.  Posterior pharynx without swelling or exudate.  Tonsils without swelling or erythema.  Neck: Supple.  No masses, nodules or thyromegaly. Respiratory: Effort is regular with non-labored breathing. Breath sounds are equal bilaterally without rales, rhonchi, wheezing or stridor.  Cardio: RRR with no MRGs. Brisk peripheral pulses without edema.  Abdomen: Active BS in all four quadrants.  Soft and non-tender without guarding, rebound tenderness, hernias or masses. Lymphatics: Non tender without lymphadenopathy.  Musculoskeletal: Full ROM, 5/5 strength, normal ambulation.  No clubbing or cyanosis. Skin: Appropriate color for ethnicity. Warm without rashes, lesions, ecchymosis, ulcers.  Neuro: CN II-XII grossly normal. Normal muscle tone  without cerebellar symptoms and intact sensation.   Psych: AO X 3,  appropriate mood and affect, insight and judgment.     Adela Glimpse, NP 10:43 AM [facility]

## 2023-06-18 LAB — URINALYSIS, ROUTINE W REFLEX MICROSCOPIC
Bacteria, UA: NONE SEEN /HPF
Bilirubin Urine: NEGATIVE
Glucose, UA: NEGATIVE
Hgb urine dipstick: NEGATIVE
Hyaline Cast: NONE SEEN /LPF
Ketones, ur: NEGATIVE
Nitrite: NEGATIVE
Protein, ur: NEGATIVE
RBC / HPF: NONE SEEN /HPF (ref 0–2)
Specific Gravity, Urine: 1.016 (ref 1.001–1.035)
WBC, UA: NONE SEEN /HPF (ref 0–5)
pH: 7.5 (ref 5.0–8.0)

## 2023-06-18 LAB — MICROSCOPIC MESSAGE

## 2023-06-19 LAB — URINE CULTURE
MICRO NUMBER:: 15516873
SPECIMEN QUALITY:: ADEQUATE

## 2023-06-29 DIAGNOSIS — M5412 Radiculopathy, cervical region: Secondary | ICD-10-CM | POA: Diagnosis not present

## 2023-07-01 DIAGNOSIS — M5412 Radiculopathy, cervical region: Secondary | ICD-10-CM | POA: Diagnosis not present

## 2023-07-09 ENCOUNTER — Other Ambulatory Visit: Payer: Self-pay | Admitting: Internal Medicine

## 2023-07-09 DIAGNOSIS — E785 Hyperlipidemia, unspecified: Secondary | ICD-10-CM

## 2023-07-09 DIAGNOSIS — I10 Essential (primary) hypertension: Secondary | ICD-10-CM

## 2023-07-09 DIAGNOSIS — E66811 Obesity, class 1: Secondary | ICD-10-CM

## 2023-07-13 ENCOUNTER — Encounter: Payer: BC Managed Care – PPO | Admitting: Nurse Practitioner

## 2023-07-14 ENCOUNTER — Encounter: Payer: Self-pay | Admitting: Nurse Practitioner

## 2023-07-14 ENCOUNTER — Ambulatory Visit (INDEPENDENT_AMBULATORY_CARE_PROVIDER_SITE_OTHER): Payer: BC Managed Care – PPO | Admitting: Nurse Practitioner

## 2023-07-14 VITALS — BP 150/96 | HR 68 | Temp 97.6°F | Ht 66.5 in | Wt 183.0 lb

## 2023-07-14 DIAGNOSIS — B9689 Other specified bacterial agents as the cause of diseases classified elsewhere: Secondary | ICD-10-CM

## 2023-07-14 DIAGNOSIS — N760 Acute vaginitis: Secondary | ICD-10-CM

## 2023-07-14 DIAGNOSIS — N3000 Acute cystitis without hematuria: Secondary | ICD-10-CM | POA: Diagnosis not present

## 2023-07-14 DIAGNOSIS — R8290 Unspecified abnormal findings in urine: Secondary | ICD-10-CM

## 2023-07-14 MED ORDER — METRONIDAZOLE 500 MG PO TABS
500.0000 mg | ORAL_TABLET | Freq: Two times a day (BID) | ORAL | 0 refills | Status: DC
Start: 2023-07-14 — End: 2023-08-11

## 2023-07-14 MED ORDER — VALACYCLOVIR HCL 500 MG PO TABS: ORAL_TABLET | ORAL | 0 refills | Status: AC

## 2023-07-14 NOTE — Patient Instructions (Signed)

## 2023-07-14 NOTE — Progress Notes (Signed)
Assessment and Plan:  Natasha Fritz was seen today for a episodic visit.  Diagnoses and all order for this visit:  Acute cystitis without hematuria Defer tmt with abx until cx confirms If persistent discussed prophylactic tmt with Macorbid for 30 days. Stay well hydrated to keep urinary system well flushed Consider daily cranberry juice or oral supplement to help any bacteria from adhering to bladder wall causing increase for infection  - Urinalysis, Routine w reflex microscopic - Urine Culture  Malodorous urine/Bacterial vaginosis Discuss BV Will treat with Flagyl  Discussed benefit of probiotic/yogurt to help balance vaginal pH.  - Urinalysis, Routine w reflex microscopic - Urine Culture   Notify office for further evaluation and treatment, questions or concerns if s/s fail to improve. The risks and benefits of my recommendations, as well as other treatment options were discussed with the patient today. Questions were answered.  Further disposition pending results of labs. Discussed med's effects and SE's.    Over 20 minutes of exam, counseling, chart review, and critical decision making was performed.   Future Appointments  Date Time Provider Department Center  07/20/2023  9:00 AM Tacy Chavis, Archie Patten, NP [facility] None  02/17/2024  1:00 PM Lomax, Amy, NP [facility] None    ------------------------------------------------------------------------------------------------------------------   HPI BP (!) 150/96   Pulse 68   Temp 97.6 F (36.4 C)   Ht 5' 6.5" (1.689 m)   Wt 183 lb (83 kg)   LMP 09/05/2012   SpO2 98%   BMI 29.09 kg/m    Patient complains of continued abnormal smelling urine.  Reports urine smells "dead."  Associated vaginal discharge.  She has had symptoms intermittently for 2 weeks.  Tried to treat at home with OTC remedies, AZO and cranberry but not improvement.  Was treated with Ciprofloxacin for +e. Coli 06/17/23.  Patient denies back pain, fever, headache,  stomach ache, and vaginal discharge. Patient does have a history of recurrent UTI. Patient does not have a history of pyelonephritis.    Past Medical History:  Diagnosis Date   Achilles tendinitis    Acid reflux 07/09/2020   Anemia    Anxiety    Basal cell carcinoma of nose 05/23/2015   Endometrial hyperplasia without atypia, simple 09/10/2012   Endometrial hyperplasia without atypia, simple 09/10/2012   HSV-1 infection    HTN (hypertension)    Hyperlipidemia    Migraine    PONV (postoperative nausea and vomiting)    Pre-diabetes    Recurrent upper respiratory infection (URI)    Urticaria      Allergies  Allergen Reactions   Penicillins Hives    REACTION: hives reaction seen in 1993 per patient hives were "all over" no SOB, no facial swelling, no dizziness / syncope   Pravastatin Other (See Comments)    myalgias    Current Outpatient Medications on File Prior to Visit  Medication Sig   ALPRAZolam (XANAX) 0.5 MG tablet TAKE 1 TABLET BY MOUTH 2 TIMES DAILY AS NEEDED (Patient taking differently: Take 0.5 mg by mouth daily as needed for anxiety or sleep.)   aspirin 81 MG EC tablet Take 81 mg by mouth daily.   azelastine (ASTELIN) 0.1 % nasal spray Place 1 spray into both nostrils 2 (two) times daily. Use in each nostril as directed (Patient taking differently: Place 1 spray into both nostrils 2 (two) times daily as needed for allergies. Use in each nostril as directed)   cetirizine (ZYRTEC ALLERGY) 10 MG tablet Take 1 tablet (10 mg total) by  mouth daily as needed for allergies. (Patient taking differently: Take 10 mg by mouth daily.)   CHELATED MAGNESIUM PO Take 72 mg by mouth daily.   cholecalciferol (VITAMIN D) 1000 UNITS tablet Take 1,000 Units by mouth daily.   cyclobenzaprine (FLEXERIL) 5 MG tablet Take 1 tablet (5 mg total) by mouth 3 (three) times daily as needed for muscle spasms.   ezetimibe (ZETIA) 10 MG tablet Take 1 tablet (10 mg total) by mouth daily.   fluconazole  (DIFLUCAN) 150 MG tablet Take 1 tablet (150 mg) at the sign of symptoms.  If not resolved after 72 hours may take additional 150 mg dosage.   fluocinonide cream (LIDEX) 0.05 % Apply 1 application  topically daily as needed (itching).   fluticasone (FLONASE) 50 MCG/ACT nasal spray Place 2 sprays into both nostrils daily.   ketoconazole (NIZORAL) 2 % cream Apply 1 application  topically daily as needed for irritation.   meloxicam (MOBIC) 15 MG tablet TAKE 1 TABLET EVERY OTHER DAY AS NEEDED FOR PAIN Do not take with ibuprofen OR aleve   Multiple Vitamin (MULTIVITAMIN) tablet Take 1 tablet by mouth daily.   olmesartan (BENICAR) 40 MG tablet TAKE 1/2 TABLET DAILY FOR BLOOD PRESSURE   Omega 3-6-9 Fatty Acids (OMEGA 3-6-9 PO) Take 3,600 mg by mouth daily.   ondansetron (ZOFRAN) 4 MG tablet Take 1 tablet (4 mg total) by mouth every 8 (eight) hours as needed for nausea or vomiting.   oxyCODONE-acetaminophen (PERCOCET/ROXICET) 5-325 MG tablet Take 1 tablet by mouth every 6 (six) hours as needed for severe pain.   venlafaxine XR (EFFEXOR-XR) 37.5 MG 24 hr capsule Take 37.5 mg by mouth daily. For hot flashes.   WEGOVY 1.7 MG/0.75ML SOAJ INJECT 0.5ML SUBCUTANEOUSLY ONCE WEEKLY.   ciprofloxacin (CIPRO) 250 MG tablet Take 1 tablet 2 x /day with Food for Infection   No current facility-administered medications on file prior to visit.    ROS: all negative except what is noted in the HPI.   Physical Exam:  BP (!) 150/96   Pulse 68   Temp 97.6 F (36.4 C)   Ht 5' 6.5" (1.689 m)   Wt 183 lb (83 kg)   LMP 09/05/2012   SpO2 98%   BMI 29.09 kg/m   General Appearance: NAD.  Awake, conversant and cooperative. Eyes: PERRLA, EOMs intact.  Sclera white.  Conjunctiva without erythema. Sinuses: No frontal/maxillary tenderness.  No nasal discharge. Nares patent.  ENT/Mouth: Ext aud canals clear.  Bilateral TMs w/DOL and without erythema or bulging. Hearing intact.  Posterior pharynx without swelling or exudate.   Tonsils without swelling or erythema.  Neck: Supple.  No masses, nodules or thyromegaly. Respiratory: Effort is regular with non-labored breathing. Breath sounds are equal bilaterally without rales, rhonchi, wheezing or stridor.  Cardio: RRR with no MRGs. Brisk peripheral pulses without edema.  Abdomen: Active BS in all four quadrants.  Soft and non-tender without guarding, rebound tenderness, hernias or masses. Lymphatics: Non tender without lymphadenopathy.  Musculoskeletal: Full ROM, 5/5 strength, normal ambulation.  No clubbing or cyanosis. Skin: Appropriate color for ethnicity. Warm without rashes, lesions, ecchymosis, ulcers.  Neuro: CN II-XII grossly normal. Normal muscle tone without cerebellar symptoms and intact sensation.   Psych: AO X 3,  appropriate mood and affect, insight and judgment.     Adela Glimpse, NP 4:26 PM [facility]

## 2023-07-15 LAB — URINALYSIS, ROUTINE W REFLEX MICROSCOPIC
Bilirubin Urine: NEGATIVE
Glucose, UA: NEGATIVE
Hgb urine dipstick: NEGATIVE
Ketones, ur: NEGATIVE
Leukocytes,Ua: NEGATIVE
Nitrite: NEGATIVE
Protein, ur: NEGATIVE
Specific Gravity, Urine: 1.009 (ref 1.001–1.035)
pH: 7.5 (ref 5.0–8.0)

## 2023-07-15 LAB — URINE CULTURE
MICRO NUMBER:: 15627811
Result:: NO GROWTH
SPECIMEN QUALITY:: ADEQUATE

## 2023-07-20 ENCOUNTER — Encounter: Payer: Self-pay | Admitting: Nurse Practitioner

## 2023-07-20 ENCOUNTER — Ambulatory Visit (INDEPENDENT_AMBULATORY_CARE_PROVIDER_SITE_OTHER): Payer: BC Managed Care – PPO | Admitting: Nurse Practitioner

## 2023-07-20 VITALS — BP 130/90 | HR 89 | Temp 97.8°F | Ht 66.0 in | Wt 180.4 lb

## 2023-07-20 DIAGNOSIS — Z79899 Other long term (current) drug therapy: Secondary | ICD-10-CM

## 2023-07-20 DIAGNOSIS — Z131 Encounter for screening for diabetes mellitus: Secondary | ICD-10-CM

## 2023-07-20 DIAGNOSIS — G4733 Obstructive sleep apnea (adult) (pediatric): Secondary | ICD-10-CM

## 2023-07-20 DIAGNOSIS — M5030 Other cervical disc degeneration, unspecified cervical region: Secondary | ICD-10-CM

## 2023-07-20 DIAGNOSIS — Z0000 Encounter for general adult medical examination without abnormal findings: Secondary | ICD-10-CM | POA: Diagnosis not present

## 2023-07-20 DIAGNOSIS — E538 Deficiency of other specified B group vitamins: Secondary | ICD-10-CM

## 2023-07-20 DIAGNOSIS — Z136 Encounter for screening for cardiovascular disorders: Secondary | ICD-10-CM | POA: Diagnosis not present

## 2023-07-20 DIAGNOSIS — R7303 Prediabetes: Secondary | ICD-10-CM

## 2023-07-20 DIAGNOSIS — Z130 Encounter for screening for diseases of the blood and blood-forming organs and certain disorders involving the immune mechanism: Secondary | ICD-10-CM

## 2023-07-20 DIAGNOSIS — I10 Essential (primary) hypertension: Secondary | ICD-10-CM

## 2023-07-20 DIAGNOSIS — F419 Anxiety disorder, unspecified: Secondary | ICD-10-CM

## 2023-07-20 DIAGNOSIS — Z1389 Encounter for screening for other disorder: Secondary | ICD-10-CM

## 2023-07-20 DIAGNOSIS — E785 Hyperlipidemia, unspecified: Secondary | ICD-10-CM

## 2023-07-20 DIAGNOSIS — Z0001 Encounter for general adult medical examination with abnormal findings: Secondary | ICD-10-CM

## 2023-07-20 DIAGNOSIS — E559 Vitamin D deficiency, unspecified: Secondary | ICD-10-CM

## 2023-07-20 DIAGNOSIS — Z13220 Encounter for screening for lipoid disorders: Secondary | ICD-10-CM | POA: Diagnosis not present

## 2023-07-20 DIAGNOSIS — E66811 Obesity, class 1: Secondary | ICD-10-CM

## 2023-07-20 DIAGNOSIS — Z1329 Encounter for screening for other suspected endocrine disorder: Secondary | ICD-10-CM

## 2023-07-20 DIAGNOSIS — G43809 Other migraine, not intractable, without status migrainosus: Secondary | ICD-10-CM

## 2023-07-20 DIAGNOSIS — M7661 Achilles tendinitis, right leg: Secondary | ICD-10-CM

## 2023-07-20 DIAGNOSIS — Z85828 Personal history of other malignant neoplasm of skin: Secondary | ICD-10-CM

## 2023-07-20 DIAGNOSIS — M5412 Radiculopathy, cervical region: Secondary | ICD-10-CM | POA: Diagnosis not present

## 2023-07-20 MED ORDER — ONDANSETRON 4 MG PO TBDP: 4.0000 mg | ORAL_TABLET | Freq: Three times a day (TID) | ORAL | 0 refills | Status: DC | PRN

## 2023-07-20 NOTE — Progress Notes (Signed)
Complete Physical  Assessment and Plan:  Encounter for general adult medical examination with abnormal findings Due annually  Health maintenance reviewed Healthily lifestyle goals set  Mammogram due - patient to reach out  Essential hypertension Discussed DASH (Dietary Approaches to Stop Hypertension) DASH diet is lower in sodium than a typical American diet. Cut back on foods that are high in saturated fat, cholesterol, and trans fats. Eat more whole-grain foods, fish, poultry, and nuts Remain active and exercise as tolerated daily.  Monitor BP at home-Call if greater than 130/80.  Check and monitor CMP/CBC  Hyperlipidemia Discussed lifestyle modifications. Recommended diet heavy in fruits and veggies, omega 3's. Decrease consumption of animal meats, cheeses, and dairy products. Remain active and exercise as tolerated. Continue to monitor. Check and monitor lipids/TSH  Prediabetes Education: Reviewed 'ABCs' of diabetes management  Discussed goals to be met and/or maintained include A1C (<7) Blood pressure (<130/80) Cholesterol (LDL <70) Continue Eye Exam yearly  Continue Dental Exam Q6 mo Discussed dietary recommendations Discussed Physical Activity recommendations Check and monitor A1C  Medication management All medications discussed and reviewed in full. All questions and concerns regarding medications addressed.    Vitamin D deficiency Continue supplement Monitor levels  DDD (degenerative disc disease), cervical Controlled Meloxicam PRN Continue to monitor  Hx of basal cell carcinoma of nose Derm following annually  Apt 07/2022  Other migraine without status migrainosus, not intractable Resolved  Obesity - BMI 30  Discussed appropriate BMI Diet modification. Physical activity. Encouraged/praised to build confidence.  OSA OSA on CPAP  Follows Neurology - last seen 01/2022 Consulted 09/2021 for snoring and fatigue.   HST 11/06/2021 showed moderately  severel OSA. Denies other concerning sx of OSA  Anxiety Continue Effexor - also uses for hot flashes Reviewed relaxation techniques.  Sleep hygiene. Recommended mindfulness meditation and exercise.   Psychoeducation:  encouraged personality growth wand development through coping techniques and problem-solving skills. Limit/Decrease/Monitor drug/alcohol intake.    Bilateral achilles tendonitis Currently controlled. Continue to monitor  Screening for thyroid disorder Check and monitor TSH  Screening for proteinuria/hematuria Check and monitor UA  B12 Deficiency Check and monitor levels  Screening for heart disease Monitor EKG  Orders Placed This Encounter  Procedures   CBC with Differential/Platelet   COMPLETE METABOLIC PANEL WITH GFR   Lipid panel   TSH   Hemoglobin A1c   Insulin, random   VITAMIN D 25 Hydroxy (Vit-D Deficiency, Fractures)   Vitamin B12   EKG 12-Lead   Meds ordered this encounter  Medications   ondansetron (ZOFRAN-ODT) 4 MG disintegrating tablet    Sig: Take 1 tablet (4 mg total) by mouth every 8 (eight) hours as needed for nausea or vomiting.    Dispense:  30 tablet    Refill:  0    Order Specific Question:   Supervising Provider    Answer:   Lucky Cowboy [phone]   Notify office for further evaluation and treatment, questions or concerns if any reported s/s fail to improve.   The patient was advised to call back or seek an in-person evaluation if any symptoms worsen or if the condition fails to improve as anticipated.   Further disposition pending results of labs. Discussed med's effects and SE's.    I discussed the assessment and treatment plan with the patient. The patient was provided an opportunity to ask questions and all were answered. The patient agreed with the plan and demonstrated an understanding of the instructions.  Discussed med's effects and SE's. Screening labs  and tests as requested with regular follow-up as recommended.  I  provided 35 minutes of face-to-face time during this encounter including counseling, chart review, and critical decision making was preformed.  Future Appointments  Date Time Provider Department Center  02/17/2024  1:00 PM Shawnie Dapper, NP [facility] None  07/19/2024  9:00 AM Darshawn Boateng, Archie Patten, NP [facility] None    HPI  62 y.o. female  presents for a complete physical. She has Hyperlipidemia; Essential hypertension; Other abnormal glucose; Medication management; Vitamin D deficiency; DDD (degenerative disc disease), cervical; Overweight (BMI 25.0-29.9); Anxiety; History of basal cell carcinoma (BCC); History of adenomatous polyp of colon; Achilles tendinitis of both lower extremities; Acid reflux; Major depressive disorder, single episode, in remission (HCC); AV block, 1st degree; Retrognathia; Moderate obstructive sleep apnea-hypopnea syndrome; and Medial epicondylitis of right elbow on their problem list.  She is married, 3 grown kids, 2 daughters and a son, 3 grandchildren in [place].  She has recently been staying with her FIL in the hospital for tmt of cardiovascular issues - plans to get a new pacemaker today.    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  Her mom had lung cancer and passed away in Aug 18, 2019. She is still trying to handle her mother's estate.  Mood has become more stable.  She is no longer taking Lexapro. She is continuing Effexor for hot flashes.    She continues to note pain in the right lower chest area.  More present when lying down and during palpation.  Has been present for the last several months post cholecystectomy.  Has not noticed any radiating pain.  No changes to BM, constipation or diarrhea.  Denies blood in stool. Denies any recent fall, trauma, injury.   Follows Dr. Jul[facility]ene Pina at [facility] annually. Hx of hysterectomy sparing ovaries.   She follows with derm annually, due to history of BCC to nose.  Had a recent ares on the chest that was removed - no concern for malignancy.  She  has hx of snoring, obesity.  Saw [facility] 09/2021 for HST which revealed moderate OSA.  Has CPAP.  She continues to use nightly and reports feeling rested.  BMI is Body mass index is 29.12 kg/m., she has been working on diet and exercise.  Now on [facility].   Wt Readings from Last 3 Encounters:  07/20/23 180 lb 6.4 oz (81.8 kg)  07/14/23 183 lb (83 kg)  06/17/23 183 lb 9.6 oz (83.3 kg)   Today their BP is BP: (!) 130/90 and well controlled.  She denies chest pain, shortness of breath, dizziness.   She is not on cholesterol medication and denies myalgias. Her cholesterol is not at goal. The cholesterol last visit was:   Lab Results  Component Value Date   CHOL 173 01/14/2023   HDL 39 (L) 01/14/2023   LDLCALC 102 (H) 01/14/2023   TRIG 204 (H) 01/14/2023   CHOLHDL 4.4 01/14/2023   She has been working on diet and exercise for prediabetes, and denies paresthesia of the feet, polydipsia, polyuria and visual disturbances. Last A1C in the office was:  Lab Results  Component Value Date   HGBA1C 6.2 (H) 01/14/2023   Patient is on Vitamin D supplement.   Lab Results  Component Value Date   VD25OH Aug 17, 2057 01/14/2023       Current Medications:  Current Outpatient Medications on File Prior to Visit  Medication Sig Dispense Refill   ALPRAZolam (XANAX) 0.5 MG tablet TAKE 1 TABLET BY MOUTH 2 TIMES DAILY  AS NEEDED (Patient taking differently: Take 0.5 mg by mouth daily as needed for anxiety or sleep.) 60 tablet 0   aspirin 81 MG EC tablet Take 81 mg by mouth daily.     azelastine (ASTELIN) 0.1 % nasal spray Place 1 spray into both nostrils 2 (two) times daily. Use in each nostril as directed (Patient taking differently: Place 1 spray into both nostrils 2 (two) times daily as needed for allergies. Use in each nostril as directed) 30 mL 11   cetirizine (ZYRTEC ALLERGY) 10 MG tablet Take 1 tablet (10 mg total) by mouth daily as needed for allergies. (Patient taking differently: Take 10 mg by  mouth daily.) 30 tablet 11   CHELATED MAGNESIUM PO Take 72 mg by mouth daily.     cholecalciferol (VITAMIN D) 1000 UNITS tablet Take 1,000 Units by mouth daily.     cyclobenzaprine (FLEXERIL) 5 MG tablet Take 1 tablet (5 mg total) by mouth 3 (three) times daily as needed for muscle spasms. 60 tablet 0   ezetimibe (ZETIA) 10 MG tablet Take 1 tablet (10 mg total) by mouth daily. 90 tablet 3   fluconazole (DIFLUCAN) 150 MG tablet Take 1 tablet (150 mg) at the sign of symptoms.  If not resolved after 72 hours may take additional 150 mg dosage. 3 tablet 0   fluocinonide cream (LIDEX) 0.05 % Apply 1 application  topically daily as needed (itching).     fluticasone (FLONASE) 50 MCG/ACT nasal spray Place 2 sprays into both nostrils daily. 16 g 11   ketoconazole (NIZORAL) 2 % cream Apply 1 application  topically daily as needed for irritation.     meloxicam (MOBIC) 15 MG tablet TAKE 1 TABLET EVERY OTHER DAY AS NEEDED FOR PAIN Do not take with ibuprofen OR aleve 20 tablet 1   metroNIDAZOLE (FLAGYL) 500 MG tablet Take 1 tablet (500 mg total) by mouth 2 (two) times daily. 14 tablet 0   Multiple Vitamin (MULTIVITAMIN) tablet Take 1 tablet by mouth daily.     olmesartan (BENICAR) 40 MG tablet TAKE 1/2 TABLET DAILY FOR BLOOD PRESSURE     Omega 3-6-9 Fatty Acids (OMEGA 3-6-9 PO) Take 3,600 mg by mouth daily.     oxyCODONE-acetaminophen (PERCOCET/ROXICET) 5-325 MG tablet Take 1 tablet by mouth every 6 (six) hours as needed for severe pain. 12 tablet 0   valACYclovir (VALTREX) 500 MG tablet Take 1 tablet daily-prophylaxis for Cold Sores/Fever Blisters 90 tablet 0   venlafaxine XR (EFFEXOR-XR) 37.5 MG 24 hr capsule Take 37.5 mg by mouth daily. For hot flashes.     WEGOVY 1.7 MG/0.75ML SOAJ INJECT 0.5ML SUBCUTANEOUSLY ONCE WEEKLY. 2 mL 2   No current facility-administered medications on file prior to visit.   Health Maintenance:   Immunization History  Administered Date(s) Administered   Influenza Inj Mdck Quad  Pf 07/02/2021   Influenza Inj Mdck Quad With Preservative 07/09/2020   Influenza-Unspecified 06/22/2013, 06/22/2017, 07/12/2018, 07/04/2019, 06/17/2022, 07/06/2023   PFIZER(Purple Top)SARS-COV-2 Vaccination 10/12/2019, 11/02/2019, 06/26/2020, 06/04/2021   Pfizer Covid-19 Vaccine Bivalent Booster 12yrs & up 03/28/2022, 06/24/2022   Tdap 09/22/2006, 08/10/2017   Zoster Recombinant(Shingrix) 06/22/2017, 11/04/2017   Tetanus: 2018 Pneumovax: N/A Prevnar 13: N/A Flu vaccine: 06/2023 Pharmacy Shingrix: 2/2 Covid 19: 3/3, 2021, pfizer, boosted 06/2022 Pharmacy  LMP: 2013, TAH Pap: April 2019 Dr. Juliene Pina, going annually  MGM: 06/2022 Negative 1 year DEXA: N/A  Colonoscopy: 04/2020, [place], adenomatous polyp, due 3 years EGD: N/A  Last Dental Exam: Dr. Ethelle Lyon,  2024 q6 mo  [facility]  last 2024 Last Eye Exam: Dr. Caryn Section, wears glasses, annually, last 2024  Patient Care Team: Lucky Cowboy, MD as PCP - General (Internal Medicine) Salvatore Marvel, MD as Consulting Physician (Orthopedic Surgery) Mateo Flow, MD as Consulting Physician (Ophthalmology) Pricilla Riffle, MD as Consulting Physician (Cardiology) Louis Meckel, MD (Inactive) as Consulting Physician (Gastroenterology) Shea Evans, MD as Consulting Physician (Obstetrics and Gynecology) Hilda Lias, MD as Consulting Physician (Neurosurgery) Cherlyn Roberts, MD as Consulting Physician (Dermatology)  Medical History:  Past Medical History:  Diagnosis Date   Achilles tendinitis    Acid reflux 07/09/2020   Anemia    Anxiety    Basal cell carcinoma of nose 05/23/2015   Endometrial hyperplasia without atypia, simple 09/10/2012   Endometrial hyperplasia without atypia, simple 09/10/2012   HSV-1 infection    HTN (hypertension)    Hyperlipidemia    Migraine    PONV (postoperative nausea and vomiting)    Pre-diabetes    Recurrent upper respiratory infection (URI)    Urticaria    Allergies Allergies  Allergen  Reactions   Penicillins Hives    REACTION: hives reaction seen in 1993 per patient hives were "all over" no SOB, no facial swelling, no dizziness / syncope   Pravastatin Other (See Comments)    myalgias    SURGICAL HISTORY She  has a past surgical history that includes Tubal ligation (1994); acl replacement; Cervical disc surgery (2012); Robotic assisted total hysterectomy (09/10/2012); Bilateral salpingectomy (09/10/2012); De Quervian's release (Bilateral, 2019); Colonoscopy (2011); Achilles tendon surgery (Right, 10/19/2020); and Cholecystectomy (N/A, 01/19/2023). FAMILY HISTORY Her family history includes Alzheimer's disease in her father; Asthma in her daughter; Breast cancer in her mother; CVA (age of onset: 56) in her brother; Cancer in her maternal grandfather; Diabetes in her brother; Heart disease in her brother; Hyperlipidemia in her brother; Hypertension in her brother, brother, and mother; Lung cancer in her mother; Stroke in her mother. SOCIAL HISTORY She  reports that she has never smoked. She has been exposed to tobacco smoke. She has never used smokeless tobacco. She reports current alcohol use. She reports that she does not use drugs.  Review of Systems:  Review of Systems  Constitutional: Negative.  Negative for malaise/fatigue and weight loss.  HENT:  Negative for congestion, ear discharge, ear pain, hearing loss, nosebleeds, sore throat and tinnitus.   Eyes: Negative.  Negative for blurred vision and double vision.  Respiratory: Negative.  Negative for cough, shortness of breath, wheezing and stridor.   Cardiovascular: Negative.  Negative for chest pain, palpitations, orthopnea, claudication and leg swelling.  Gastrointestinal: Negative.  Negative for abdominal pain, blood in stool, constipation, diarrhea, heartburn, melena, nausea and vomiting.  Genitourinary: Negative.   Musculoskeletal:  Negative for back pain, falls, joint pain, myalgias and neck pain.  Skin: Negative.   Negative for rash.  Neurological:  Negative for dizziness, tingling, tremors, sensory change, speech change, focal weakness, seizures, loss of consciousness, weakness and headaches.  Endo/Heme/Allergies: Negative.  Negative for polydipsia.  Psychiatric/Behavioral:  Negative for depression, hallucinations, memory loss, substance abuse and suicidal ideas. The patient is not nervous/anxious and does not have insomnia.   All other systems reviewed and are negative.   Physical Exam: Estimated body mass index is 29.12 kg/m as calculated from the following:   Height as of this encounter: 5\' 6"  (1.676 m).   Weight as of this encounter: 180 lb 6.4 oz (81.8 kg). BP (!) 130/90   Pulse 89   Temp 97.8 F (  36.6 C)   Ht 5\' 6"  (1.676 m)   Wt 180 lb 6.4 oz (81.8 kg)   LMP 09/05/2012   SpO2 98%   BMI 29.12 kg/m   Wt Readings from Last 3 Encounters:  07/20/23 180 lb 6.4 oz (81.8 kg)  07/14/23 183 lb (83 kg)  06/17/23 183 lb 9.6 oz (83.3 kg)   General Appearance: Well nourished, in no apparent distress.  Eyes: PERRLA, EOMs, conjunctiva no swelling or erythema Sinuses: No Frontal/maxillary tenderness  ENT/Mouth: Ext aud canals clear, normal light reflex with TMs without erythema, bulging. Good dentition. No erythema, swelling, or exudate on post pharynx. Tonsils not swollen or erythematous. Hearing normal.  Neck: Supple, thyroid normal. No bruits  Respiratory: Respiratory effort normal, BS equal bilaterally without rales, rhonchi, wheezing or stridor.  Cardio: RRR without murmurs, rubs or gallops. Brisk peripheral pulses without edema.  Chest: Right lower anterior chest with pain upon palpation along 10th and 11th rib.  Otherwise symmetric, with normal excursions and percussion.  Breasts: defer to GYN Abdomen: Mildly tender to RUQ otherwise soft, no guarding, rebound, hernias, masses, or organomegaly.  Lymphatics: Non tender without lymphadenopathy.  Genitourinary: defer to GYN Musculoskeletal:  Full ROM all peripheral extremities,5/5 strength, and mildly antalgic gait. She has bilateral achilles tendon tenderness and mild swelling/boggy without erythema or heat.  Skin: Warm.  Appropriate color for ethnicity. Neuro: Cranial nerves intact, reflexes equal bilaterally. Normal muscle tone, no cerebellar symptoms. Sensation intact.  Psych: Awake and oriented X 3, normal affect, Insight and Judgment appropriate.   EKG: NSR  Keval Nam 9:53 AM [facility]

## 2023-07-20 NOTE — Patient Instructions (Signed)

## 2023-07-21 LAB — COMPLETE METABOLIC PANEL WITH GFR
AG Ratio: 1.8 (calc) (ref 1.0–2.5)
ALT: 30 U/L — ABNORMAL HIGH (ref 6–29)
AST: 34 U/L (ref 10–35)
Albumin: 4.4 g/dL (ref 3.6–5.1)
Alkaline phosphatase (APISO): 88 U/L (ref 37–153)
BUN: 20 mg/dL (ref 7–25)
CO2: 28 mmol/L (ref 20–32)
Calcium: 9.8 mg/dL (ref 8.6–10.4)
Chloride: 104 mmol/L (ref 98–110)
Creat: 0.81 mg/dL (ref 0.50–1.05)
Globulin: 2.4 g/dL (ref 1.9–3.7)
Glucose, Bld: 88 mg/dL (ref 65–99)
Potassium: 4.4 mmol/L (ref 3.5–5.3)
Sodium: 141 mmol/L (ref 135–146)
Total Bilirubin: 0.5 mg/dL (ref 0.2–1.2)
Total Protein: 6.8 g/dL (ref 6.1–8.1)
eGFR: 82 mL/min/{1.73_m2} (ref >=60)

## 2023-07-21 LAB — CBC WITH DIFFERENTIAL/PLATELET
Absolute Lymphocytes: 1379 {cells}/uL (ref 850–3900)
Absolute Monocytes: 409 {cells}/uL (ref 200–950)
Basophils Absolute: 53 {cells}/uL (ref 0–200)
Basophils Relative: 0.8 %
Eosinophils Absolute: 112 {cells}/uL (ref 15–500)
Eosinophils Relative: 1.7 %
HCT: 39 % (ref 35.0–45.0)
Hemoglobin: 12.8 g/dL (ref 11.7–15.5)
MCH: 29.7 pg (ref 27.0–33.0)
MCHC: 32.8 g/dL (ref 32.0–36.0)
MCV: 90.5 fL (ref 80.0–100.0)
MPV: 10.7 fL (ref 7.5–12.5)
Monocytes Relative: 6.2 %
Neutro Abs: 4646 {cells}/uL (ref 1500–7800)
Neutrophils Relative %: 70.4 %
Platelets: 274 10*3/uL (ref 140–400)
RBC: 4.31 10*6/uL (ref 3.80–5.10)
RDW: 12.6 % (ref 11.0–15.0)
Total Lymphocyte: 20.9 %
WBC: 6.6 10*3/uL (ref 3.8–10.8)

## 2023-07-21 LAB — VITAMIN B12: Vitamin B-12: 699 pg/mL (ref 200–1100)

## 2023-07-21 LAB — HEMOGLOBIN A1C
Hgb A1c MFr Bld: 5.7 %{Hb} — ABNORMAL HIGH (ref <5.7)
Mean Plasma Glucose: 117 mg/dL
eAG (mmol/L): 6.5 mmol/L

## 2023-07-21 LAB — LIPID PANEL
Cholesterol: 134 mg/dL (ref <200)
HDL: 38 mg/dL — ABNORMAL LOW (ref >=50)
LDL Cholesterol (Calc): 77 mg/dL
Non-HDL Cholesterol (Calc): 96 mg/dL (ref <130)
Total CHOL/HDL Ratio: 3.5 (calc) (ref <5.0)
Triglycerides: 105 mg/dL (ref <150)

## 2023-07-21 LAB — TSH: TSH: 1.83 m[IU]/L (ref 0.40–4.50)

## 2023-07-21 LAB — INSULIN, RANDOM: Insulin: 16.5 u[IU]/mL

## 2023-07-21 LAB — VITAMIN D 25 HYDROXY (VIT D DEFICIENCY, FRACTURES): Vit D, 25-Hydroxy: 62 ng/mL (ref 30–100)

## 2023-07-27 DIAGNOSIS — Z111 Encounter for screening for respiratory tuberculosis: Secondary | ICD-10-CM | POA: Diagnosis not present

## 2023-07-30 ENCOUNTER — Ambulatory Visit: Payer: BC Managed Care – PPO | Admitting: Nurse Practitioner

## 2023-08-07 ENCOUNTER — Other Ambulatory Visit (HOSPITAL_COMMUNITY): Payer: Self-pay

## 2023-08-11 ENCOUNTER — Ambulatory Visit (INDEPENDENT_AMBULATORY_CARE_PROVIDER_SITE_OTHER): Payer: BC Managed Care – PPO | Admitting: Nurse Practitioner

## 2023-08-11 ENCOUNTER — Encounter: Payer: Self-pay | Admitting: Nurse Practitioner

## 2023-08-11 ENCOUNTER — Other Ambulatory Visit: Payer: Self-pay

## 2023-08-11 VITALS — BP 130/86 | HR 99 | Temp 97.9°F | Ht 66.0 in | Wt 177.2 lb

## 2023-08-11 DIAGNOSIS — R6889 Other general symptoms and signs: Secondary | ICD-10-CM | POA: Diagnosis not present

## 2023-08-11 DIAGNOSIS — J069 Acute upper respiratory infection, unspecified: Secondary | ICD-10-CM | POA: Diagnosis not present

## 2023-08-11 DIAGNOSIS — Z1152 Encounter for screening for COVID-19: Secondary | ICD-10-CM | POA: Diagnosis not present

## 2023-08-11 DIAGNOSIS — I10 Essential (primary) hypertension: Secondary | ICD-10-CM

## 2023-08-11 LAB — POC COVID19 BINAXNOW: SARS Coronavirus 2 Ag: NEGATIVE

## 2023-08-11 LAB — POCT INFLUENZA A/B
Influenza A, POC: NEGATIVE
Influenza B, POC: NEGATIVE

## 2023-08-11 MED ORDER — AZITHROMYCIN 250 MG PO TABS: ORAL_TABLET | ORAL | 1 refills | Status: DC

## 2023-08-11 MED ORDER — DEXAMETHASONE 1 MG PO TABS: ORAL_TABLET | ORAL | 0 refills | Status: DC

## 2023-08-11 NOTE — Patient Instructions (Signed)

## 2023-08-11 NOTE — Progress Notes (Signed)
Assessment and Plan:  Carmel was seen today for acute visit.  Diagnoses and all orders for this visit:  Essential hypertension - continue medications- Olmesartan 40 mg 1/2 tab QD, DASH diet, exercise and monitor at home. Call if greater than 130/80.   Encounter for screening for COVID-19 -     POC COVID-19- negative  Flu-like symptoms -     POCT Influenza A/B- negative  URI, acute Push fluids Use Mucinex DM Zithromax and dexamethasone taper as directed Promethazine DM for cough- has at home If no improvement in next 5 days notify the office -     azithromycin (ZITHROMAX) 250 MG tablet; Take 2 tablets (500 mg) on  Day 1,  followed by 1 tablet (250 mg) once daily on Days 2 through 5. -     dexamethasone (DECADRON) 1 MG tablet; Take 3 tabs for 3 days, 2 tabs for 3 days 1 tab for 5 days. Take with food.       Further disposition pending results of labs. Discussed med's effects and SE's.   Over 30 minutes of exam, counseling, chart review, and critical decision making was performed.   Future Appointments  Date Time Provider Department Center  01/18/2024  9:30 AM Adela Glimpse, NP [facility] None  02/17/2024  1:00 PM Lomax, Amy, NP [facility] None  07/19/2024  9:00 AM Cranford, Archie Patten, NP [facility] None    ------------------------------------------------------------------------------------------------------------------   HPI BP 130/86   Pulse 99   Temp 97.9 F (36.6 C)   Ht 5\' 6"  (1.676 m)   Wt 177 lb 3.2 oz (80.4 kg)   LMP 09/05/2012   SpO2 99%   BMI 28.60 kg/m   62 y.o.female presents for runny nose, productive cough of green mucus with a tinge of blood, fever/chills, body aches, headaches, sinus pressure and sneezing. Symptoms started 2 days ago.  Daughter was married this past weekend so pt was around a lot of people.  Denies nausea, vomiting and diarrhea.   BP well controlled with Olmesartan 40 mg 1/2 tab every day  BP Readings from Last 3 Encounters:  08/11/23  130/86  07/20/23 (!) 130/90  07/14/23 (!) 150/96  Denies headaches, chest pain, shortness of breath and dizziness   BMI is Body mass index is 28.6 kg/m., she has been working on diet and exercise. Wt Readings from Last 3 Encounters:  08/11/23 177 lb 3.2 oz (80.4 kg)  07/20/23 180 lb 6.4 oz (81.8 kg)  07/14/23 183 lb (83 kg)    Past Medical History:  Diagnosis Date   Achilles tendinitis    Acid reflux 07/09/2020   Anemia    Anxiety    Basal cell carcinoma of nose 05/23/2015   Endometrial hyperplasia without atypia, simple 09/10/2012   Endometrial hyperplasia without atypia, simple 09/10/2012   HSV-1 infection    HTN (hypertension)    Hyperlipidemia    Migraine    PONV (postoperative nausea and vomiting)    Pre-diabetes    Recurrent upper respiratory infection (URI)    Urticaria      Allergies  Allergen Reactions   Penicillins Hives    REACTION: hives reaction seen in 1993 per patient hives were "all over" no SOB, no facial swelling, no dizziness / syncope   Pravastatin Other (See Comments)    myalgias    Current Outpatient Medications on File Prior to Visit  Medication Sig   ALPRAZolam (XANAX) 0.5 MG tablet TAKE 1 TABLET BY MOUTH 2 TIMES DAILY AS NEEDED (Patient taking differently: Take 0.5  mg by mouth daily as needed for anxiety or sleep.)   aspirin 81 MG EC tablet Take 81 mg by mouth daily.   azelastine (ASTELIN) 0.1 % nasal spray Place 1 spray into both nostrils 2 (two) times daily. Use in each nostril as directed (Patient taking differently: Place 1 spray into both nostrils 2 (two) times daily as needed for allergies. Use in each nostril as directed)   cetirizine (ZYRTEC ALLERGY) 10 MG tablet Take 1 tablet (10 mg total) by mouth daily as needed for allergies. (Patient taking differently: Take 10 mg by mouth daily.)   CHELATED MAGNESIUM PO Take 72 mg by mouth daily.   cholecalciferol (VITAMIN D) 1000 UNITS tablet Take 1,000 Units by mouth daily.   cyclobenzaprine  (FLEXERIL) 5 MG tablet Take 1 tablet (5 mg total) by mouth 3 (three) times daily as needed for muscle spasms.   ezetimibe (ZETIA) 10 MG tablet Take 1 tablet (10 mg total) by mouth daily.   fluocinonide cream (LIDEX) 0.05 % Apply 1 application  topically daily as needed (itching).   fluticasone (FLONASE) 50 MCG/ACT nasal spray Place 2 sprays into both nostrils daily.   ketoconazole (NIZORAL) 2 % cream Apply 1 application  topically daily as needed for irritation.   meloxicam (MOBIC) 15 MG tablet TAKE 1 TABLET EVERY OTHER DAY AS NEEDED FOR PAIN Do not take with ibuprofen OR aleve   Multiple Vitamin (MULTIVITAMIN) tablet Take 1 tablet by mouth daily.   olmesartan (BENICAR) 40 MG tablet TAKE 1/2 TABLET DAILY FOR BLOOD PRESSURE   Omega 3-6-9 Fatty Acids (OMEGA 3-6-9 PO) Take 3,600 mg by mouth daily.   ondansetron (ZOFRAN-ODT) 4 MG disintegrating tablet Take 1 tablet (4 mg total) by mouth every 8 (eight) hours as needed for nausea or vomiting.   valACYclovir (VALTREX) 500 MG tablet Take 1 tablet daily-prophylaxis for Cold Sores/Fever Blisters   venlafaxine XR (EFFEXOR-XR) 37.5 MG 24 hr capsule Take 37.5 mg by mouth daily. For hot flashes.   WEGOVY 1.7 MG/0.75ML SOAJ INJECT 0.5ML SUBCUTANEOUSLY ONCE WEEKLY.   fluconazole (DIFLUCAN) 150 MG tablet Take 1 tablet (150 mg) at the sign of symptoms.  If not resolved after 72 hours may take additional 150 mg dosage. (Patient not taking: Reported on 08/11/2023)   metroNIDAZOLE (FLAGYL) 500 MG tablet Take 1 tablet (500 mg total) by mouth 2 (two) times daily. (Patient not taking: Reported on 08/11/2023)   oxyCODONE-acetaminophen (PERCOCET/ROXICET) 5-325 MG tablet Take 1 tablet by mouth every 6 (six) hours as needed for severe pain. (Patient not taking: Reported on 08/11/2023)   No current facility-administered medications on file prior to visit.    ROS: all negative except above.   Physical Exam:  BP 130/86   Pulse 99   Temp 97.9 F (36.6 C)   Ht 5\' 6"   (1.676 m)   Wt 177 lb 3.2 oz (80.4 kg)   LMP 09/05/2012   SpO2 99%   BMI 28.60 kg/m   General Appearance: Well nourished, in no apparent distress. Eyes: PERRLA, EOMs, conjunctiva no swelling or erythema Sinuses: Positive Frontal/maxillary tenderness ENT/Mouth: Ext aud canals clear, TMs without erythema, bulging. Erythema of post pharynx, no exudate   Hearing normal.  Neck: Supple, thyroid normal.  Respiratory: Respiratory effort normal, BS equal bilaterally without rales, rhonchi, wheezing or stridor.  Cardio: RRR with no MRGs. Brisk peripheral pulses without edema.  Abdomen: Soft, + BS.  Non tender, no guarding, rebound, hernias, masses. Lymphatics: Positive submandibular adenopathy  Musculoskeletal: Full ROM, 5/5 strength, normal  gait.  Skin: Warm, dry without rashes, lesions, ecchymosis.  Neuro: Cranial nerves intact. Normal muscle tone, no cerebellar symptoms. Sensation intact.  Psych: Awake and oriented X 3, normal affect, Insight and Judgment appropriate.     Raynelle Dick, NP 2:23 PM [facility]

## 2023-08-19 DIAGNOSIS — G4733 Obstructive sleep apnea (adult) (pediatric): Secondary | ICD-10-CM | POA: Diagnosis not present

## 2023-09-01 DIAGNOSIS — Z08 Encounter for follow-up examination after completed treatment for malignant neoplasm: Secondary | ICD-10-CM | POA: Diagnosis not present

## 2023-09-01 DIAGNOSIS — L821 Other seborrheic keratosis: Secondary | ICD-10-CM | POA: Diagnosis not present

## 2023-09-01 DIAGNOSIS — D225 Melanocytic nevi of trunk: Secondary | ICD-10-CM | POA: Diagnosis not present

## 2023-09-01 DIAGNOSIS — L814 Other melanin hyperpigmentation: Secondary | ICD-10-CM | POA: Diagnosis not present

## 2023-09-17 ENCOUNTER — Other Ambulatory Visit: Payer: Self-pay

## 2023-09-17 MED ORDER — WEGOVY 2.4 MG/0.75ML ~~LOC~~ SOAJ: SUBCUTANEOUS | 3 refills | Status: DC

## 2023-09-30 DIAGNOSIS — R8290 Unspecified abnormal findings in urine: Secondary | ICD-10-CM | POA: Diagnosis not present

## 2023-10-13 DIAGNOSIS — M542 Cervicalgia: Secondary | ICD-10-CM | POA: Diagnosis not present

## 2023-10-13 DIAGNOSIS — M9901 Segmental and somatic dysfunction of cervical region: Secondary | ICD-10-CM | POA: Diagnosis not present

## 2023-10-13 DIAGNOSIS — M62838 Other muscle spasm: Secondary | ICD-10-CM | POA: Diagnosis not present

## 2023-10-13 DIAGNOSIS — M9902 Segmental and somatic dysfunction of thoracic region: Secondary | ICD-10-CM | POA: Diagnosis not present

## 2023-10-15 DIAGNOSIS — M9901 Segmental and somatic dysfunction of cervical region: Secondary | ICD-10-CM | POA: Diagnosis not present

## 2023-10-15 DIAGNOSIS — M62838 Other muscle spasm: Secondary | ICD-10-CM | POA: Diagnosis not present

## 2023-10-15 DIAGNOSIS — M9902 Segmental and somatic dysfunction of thoracic region: Secondary | ICD-10-CM | POA: Diagnosis not present

## 2023-10-15 DIAGNOSIS — M542 Cervicalgia: Secondary | ICD-10-CM | POA: Diagnosis not present

## 2023-10-19 DIAGNOSIS — M9902 Segmental and somatic dysfunction of thoracic region: Secondary | ICD-10-CM | POA: Diagnosis not present

## 2023-10-19 DIAGNOSIS — M9901 Segmental and somatic dysfunction of cervical region: Secondary | ICD-10-CM | POA: Diagnosis not present

## 2023-10-19 DIAGNOSIS — M542 Cervicalgia: Secondary | ICD-10-CM | POA: Diagnosis not present

## 2023-10-19 DIAGNOSIS — M62838 Other muscle spasm: Secondary | ICD-10-CM | POA: Diagnosis not present

## 2023-10-21 DIAGNOSIS — M9901 Segmental and somatic dysfunction of cervical region: Secondary | ICD-10-CM | POA: Diagnosis not present

## 2023-10-21 DIAGNOSIS — M62838 Other muscle spasm: Secondary | ICD-10-CM | POA: Diagnosis not present

## 2023-10-21 DIAGNOSIS — M9902 Segmental and somatic dysfunction of thoracic region: Secondary | ICD-10-CM | POA: Diagnosis not present

## 2023-10-21 DIAGNOSIS — M542 Cervicalgia: Secondary | ICD-10-CM | POA: Diagnosis not present

## 2023-10-22 DIAGNOSIS — M9901 Segmental and somatic dysfunction of cervical region: Secondary | ICD-10-CM | POA: Diagnosis not present

## 2023-10-22 DIAGNOSIS — M9902 Segmental and somatic dysfunction of thoracic region: Secondary | ICD-10-CM | POA: Diagnosis not present

## 2023-10-22 DIAGNOSIS — M542 Cervicalgia: Secondary | ICD-10-CM | POA: Diagnosis not present

## 2023-10-22 DIAGNOSIS — M62838 Other muscle spasm: Secondary | ICD-10-CM | POA: Diagnosis not present

## 2023-10-26 DIAGNOSIS — M62838 Other muscle spasm: Secondary | ICD-10-CM | POA: Diagnosis not present

## 2023-10-26 DIAGNOSIS — M542 Cervicalgia: Secondary | ICD-10-CM | POA: Diagnosis not present

## 2023-10-26 DIAGNOSIS — M9901 Segmental and somatic dysfunction of cervical region: Secondary | ICD-10-CM | POA: Diagnosis not present

## 2023-10-26 DIAGNOSIS — M9902 Segmental and somatic dysfunction of thoracic region: Secondary | ICD-10-CM | POA: Diagnosis not present

## 2023-10-28 DIAGNOSIS — M9902 Segmental and somatic dysfunction of thoracic region: Secondary | ICD-10-CM | POA: Diagnosis not present

## 2023-10-28 DIAGNOSIS — M62838 Other muscle spasm: Secondary | ICD-10-CM | POA: Diagnosis not present

## 2023-10-28 DIAGNOSIS — M9901 Segmental and somatic dysfunction of cervical region: Secondary | ICD-10-CM | POA: Diagnosis not present

## 2023-10-28 DIAGNOSIS — M542 Cervicalgia: Secondary | ICD-10-CM | POA: Diagnosis not present

## 2023-10-29 DIAGNOSIS — M9902 Segmental and somatic dysfunction of thoracic region: Secondary | ICD-10-CM | POA: Diagnosis not present

## 2023-10-29 DIAGNOSIS — M9901 Segmental and somatic dysfunction of cervical region: Secondary | ICD-10-CM | POA: Diagnosis not present

## 2023-10-29 DIAGNOSIS — M542 Cervicalgia: Secondary | ICD-10-CM | POA: Diagnosis not present

## 2023-10-29 DIAGNOSIS — M62838 Other muscle spasm: Secondary | ICD-10-CM | POA: Diagnosis not present

## 2023-11-02 DIAGNOSIS — M542 Cervicalgia: Secondary | ICD-10-CM | POA: Diagnosis not present

## 2023-11-02 DIAGNOSIS — M9901 Segmental and somatic dysfunction of cervical region: Secondary | ICD-10-CM | POA: Diagnosis not present

## 2023-11-02 DIAGNOSIS — M9902 Segmental and somatic dysfunction of thoracic region: Secondary | ICD-10-CM | POA: Diagnosis not present

## 2023-11-02 DIAGNOSIS — M62838 Other muscle spasm: Secondary | ICD-10-CM | POA: Diagnosis not present

## 2023-11-02 DIAGNOSIS — M62830 Muscle spasm of back: Secondary | ICD-10-CM | POA: Diagnosis not present

## 2023-11-04 ENCOUNTER — Other Ambulatory Visit: Payer: Self-pay

## 2023-11-04 MED ORDER — EZETIMIBE 10 MG PO TABS: 10.0000 mg | ORAL_TABLET | Freq: Every day | ORAL | 0 refills | Status: DC

## 2023-11-30 ENCOUNTER — Other Ambulatory Visit: Payer: Self-pay

## 2023-11-30 MED ORDER — WEGOVY 2.4 MG/0.75ML ~~LOC~~ SOAJ: SUBCUTANEOUS | 3 refills | Status: DC

## 2023-12-04 ENCOUNTER — Other Ambulatory Visit: Payer: Self-pay

## 2023-12-04 NOTE — Telephone Encounter (Signed)
 Advised mobile bus, see 2nd encounter today regarding this refill.

## 2023-12-04 NOTE — Telephone Encounter (Signed)
 Copied from CRM [id number]. Topic: Clinical - Medication Refill >> Dec 04, 2023 11:07 AM Natasha Fritz wrote: Most Recent Primary Care Visit:   Medication: Semaglutide-Weight Management (WEGOVY) 2.4 MG/0.75ML SOAJ  Has the patient contacted their pharmacy? No   Is this the correct pharmacy for this prescription? Yes If no, delete pharmacy and type the correct one.  This is the patient's preferred pharmacy:  [facility] - [place], [place] - [address] [place] [address] Phone: [phone] Fax: [phone]     Has the prescription been filled recently? No  Is the patient out of the medication? Yes  Has the patient been seen for an appointment in the last year OR does the patient have an upcoming appointment? Yes  Can we respond through MyChart? Yes  Please assist patient further

## 2023-12-04 NOTE — Telephone Encounter (Signed)
 Copied from CRM [id number]. Topic: Clinical - Medication Refill >> Dec 04, 2023 11:07 AM Marlow Baars wrote: Most Recent Primary Care Visit:   Medication: Semaglutide-Weight Management (WEGOVY) 2.4 MG/0.75ML SOAJ  Has the patient contacted their pharmacy? No   Is this the correct pharmacy for this prescription? Yes If no, delete pharmacy and type the correct one.  This is the patient's preferred pharmacy:  [facility] - [place], [place] - [address] [place] [address] Phone: [phone] Fax: [phone]     Has the prescription been filled recently? No  Is the patient out of the medication? Yes  Has the patient been seen for an appointment in the last year OR does the patient have an upcoming appointment? Yes  Can we respond through MyChart? Yes  Please assist patient further

## 2023-12-04 NOTE — Telephone Encounter (Signed)
 Patient called and advised the soonest appointment is on 12/29/23 for her NP appointment and that she will need to establish care before a provider will refill medications. [facility], she agreed, location for Tuesday, 12/08/23 give along with hours. Advised she will be able to receive her refills enough to last until her NP appointment. She asked why wouldn't the pharmacy refill if there are refills left. Advised due to Dr. Oneta Rack is no longer practicing, the insurance will need a current provider in order to accept the prescription, she verbalized understanding. She says she will give that a try.

## 2023-12-29 ENCOUNTER — Ambulatory Visit (INDEPENDENT_AMBULATORY_CARE_PROVIDER_SITE_OTHER): Admitting: Family Medicine

## 2023-12-29 ENCOUNTER — Encounter: Payer: Self-pay | Admitting: Family Medicine

## 2023-12-29 VITALS — BP 130/72 | HR 70 | Temp 97.9°F | Ht 66.0 in | Wt 165.0 lb

## 2023-12-29 DIAGNOSIS — G8929 Other chronic pain: Secondary | ICD-10-CM | POA: Insufficient documentation

## 2023-12-29 DIAGNOSIS — M25512 Pain in left shoulder: Secondary | ICD-10-CM

## 2023-12-29 DIAGNOSIS — E785 Hyperlipidemia, unspecified: Secondary | ICD-10-CM | POA: Diagnosis not present

## 2023-12-29 DIAGNOSIS — F419 Anxiety disorder, unspecified: Secondary | ICD-10-CM

## 2023-12-29 DIAGNOSIS — B009 Herpesviral infection, unspecified: Secondary | ICD-10-CM | POA: Insufficient documentation

## 2023-12-29 DIAGNOSIS — R7303 Prediabetes: Secondary | ICD-10-CM

## 2023-12-29 DIAGNOSIS — E663 Overweight: Secondary | ICD-10-CM

## 2023-12-29 DIAGNOSIS — Z79899 Other long term (current) drug therapy: Secondary | ICD-10-CM

## 2023-12-29 DIAGNOSIS — Z7689 Persons encountering health services in other specified circumstances: Secondary | ICD-10-CM

## 2023-12-29 DIAGNOSIS — M5030 Other cervical disc degeneration, unspecified cervical region: Secondary | ICD-10-CM | POA: Diagnosis not present

## 2023-12-29 DIAGNOSIS — R110 Nausea: Secondary | ICD-10-CM

## 2023-12-29 DIAGNOSIS — I10 Essential (primary) hypertension: Secondary | ICD-10-CM | POA: Diagnosis not present

## 2023-12-29 MED ORDER — MELOXICAM 15 MG PO TABS: ORAL_TABLET | ORAL | 1 refills | Status: AC

## 2023-12-29 MED ORDER — ONDANSETRON 4 MG PO TBDP: 4.0000 mg | ORAL_TABLET | Freq: Three times a day (TID) | ORAL | 0 refills | Status: AC | PRN

## 2023-12-29 NOTE — Assessment & Plan Note (Signed)
 Uncontrolled. Patient has been prescribed Meloxicam 15mg  daily, but has not took medication in months; and Flexeril 5mg  TID as needed, but does not take as often. Refilled Meloxicam to see if this would help with her pain. CMP to assess kidney function with restarting Meloxicam.

## 2023-12-29 NOTE — Assessment & Plan Note (Signed)
 Stable. Continue with Olmesartan 40mg  daily. Ordered CMP to assess kidney function.

## 2023-12-29 NOTE — Progress Notes (Signed)
 New Patient Office Visit  Subjective   Patient ID: OCIA SIMEK, female    DOB: [birth year]  Age: 63 y.o. MRN: [id number]  CC:  Chief Complaint  Patient presents with   Establish Care    HPI ANIYAH NOBIS presents to establish care with new provider.   Patients previous primary care provider: [facility] with Dr. Lucky Cowboy.   Specialist: [facility] Dr. Wilfred Lacy & [facility] Orth[facility]  [facility] Dermatology  [facility]  [facility] [place] [facility] Dr. Cristine Polio GI with Dr. Melvia Heaps  [facility] with Shawnie Dapper, NP   Anxiety: Chronic. Patient is prescribed Alprazolam 0.5mg  BID as needed. She reports she takes a quarter or half a bedtime as needed. Does not remember the last time she took it.   Cervical and shoulder pain: Chronic. Patient is prescribed Meloxicam 15mg  daily and Flexeril 5mg  TID as needed. She takes it if severe pain or at night. Mostly takes at night time. She reports she has not took Meloxicam in months. Pain is not completely controlled. She is going to chiropractor to see if this will.   Hyperlipidemia: Chronic. Patient is taking Ezetimibe 10mg  daily. Effective.  Lab Results  Component Value Date   CHOL 134 07/20/2023   HDL 38 (L) 07/20/2023   LDLCALC 77 07/20/2023   TRIG 105 07/20/2023   CHOLHDL 3.5 07/20/2023     HTN: Chronic. Patient is taking Olmesartan 40mg  daily. She reports she monitors her BP at home. She is ranging in the 110s-120s/70-80s. Denies CP, SHOB, lower extremity edema, dizziness, or lightheadedness. Reports occasional headaches.  BP Readings from Last 3 Encounters:  12/29/23 130/72  08/11/23 130/86  07/20/23 (!) 130/90    Obesity: Patient is taking Semaglutide 2.4mg  weekly. Started medication in July 2024. Also, Zofran 4mg  ODT as needed.  Wt Readings from Last 3 Encounters:  12/29/23 165  lb (74.8 kg)  08/11/23 177 lb 3.2 oz (80.4 kg)  07/20/23 180 lb 6.4 oz (81.8 kg)    Cold Sores: Chronic. Patient is prescribed Valtrex 500mg  daily. She reports she takes as needed. She reports the medication causes her to have a headache.   Outpatient Encounter Medications as of 12/29/2023  Medication Sig   ALPRAZolam (XANAX) 0.5 MG tablet TAKE 1 TABLET BY MOUTH 2 TIMES DAILY AS NEEDED (Patient taking differently: Take 0.5 mg by mouth daily as needed for anxiety or sleep.)   aspirin 81 MG EC tablet Take 81 mg by mouth daily.   azelastine (ASTELIN) 0.1 % nasal spray Place 1 spray into both nostrils 2 (two) times daily. Use in each nostril as directed (Patient taking differently: Place 1 spray into both nostrils 2 (two) times daily as needed for allergies. Use in each nostril as directed)   cetirizine (ZYRTEC ALLERGY) 10 MG tablet Take 1 tablet (10 mg total) by mouth daily as needed for allergies. (Patient taking differently: Take 10 mg by mouth daily.)   CHELATED MAGNESIUM PO Take 72 mg by mouth daily.   cholecalciferol (VITAMIN D) 1000 UNITS tablet Take 1,000 Units by mouth daily.   cyclobenzaprine (FLEXERIL) 5 MG tablet Take 1 tablet (5 mg total) by mouth 3 (three) times daily as needed for muscle spasms.   ezetimibe (ZETIA) 10 MG tablet Take 1 tablet (10 mg total) by mouth daily.   fluticasone (FLONASE) 50 MCG/ACT nasal spray Place 2 sprays  into both nostrils daily.   ketoconazole (NIZORAL) 2 % cream Apply 1 application  topically daily as needed for irritation.   Multiple Vitamin (MULTIVITAMIN) tablet Take 1 tablet by mouth daily.   olmesartan (BENICAR) 40 MG tablet TAKE 1/2 TABLET DAILY FOR BLOOD PRESSURE   Omega 3-6-9 Fatty Acids (OMEGA 3-6-9 PO) Take 3,600 mg by mouth daily.   oxyCODONE-acetaminophen (PERCOCET/ROXICET) 5-325 MG tablet Take 1 tablet by mouth every 6 (six) hours as needed for severe pain.   Semaglutide-Weight Management (WEGOVY) 2.4 MG/0.75ML SOAJ Inject one pen once weekly    valACYclovir (VALTREX) 500 MG tablet Take 1 tablet daily-prophylaxis for Cold Sores/Fever Blisters   venlafaxine XR (EFFEXOR-XR) 37.5 MG 24 hr capsule Take 37.5 mg by mouth daily. For hot flashes.   [DISCONTINUED] fluocinonide cream (LIDEX) 0.05 % Apply 1 application  topically daily as needed (itching).   [DISCONTINUED] meloxicam (MOBIC) 15 MG tablet TAKE 1 TABLET EVERY OTHER DAY AS NEEDED FOR PAIN Do not take with ibuprofen OR aleve   [DISCONTINUED] ondansetron (ZOFRAN-ODT) 4 MG disintegrating tablet Take 1 tablet (4 mg total) by mouth every 8 (eight) hours as needed for nausea or vomiting.   meloxicam (MOBIC) 15 MG tablet TAKE 1 TABLET EVERY OTHER DAY AS NEEDED FOR PAIN Do not take with ibuprofen OR aleve   ondansetron (ZOFRAN-ODT) 4 MG disintegrating tablet Take 1 tablet (4 mg total) by mouth every 8 (eight) hours as needed for nausea or vomiting.   [DISCONTINUED] azithromycin (ZITHROMAX) 250 MG tablet Take 2 tablets (500 mg) on  Day 1,  followed by 1 tablet (250 mg) once daily on Days 2 through 5. (Patient not taking: Reported on 12/29/2023)   [DISCONTINUED] dexamethasone (DECADRON) 1 MG tablet Take 3 tabs for 3 days, 2 tabs for 3 days 1 tab for 5 days. Take with food. (Patient not taking: Reported on 12/29/2023)   No facility-administered encounter medications on file as of 12/29/2023.    Past Medical History:  Diagnosis Date   Achilles tendinitis    Acid reflux 07/09/2020   Anemia    Anxiety    Basal cell carcinoma of nose 05/23/2015   Endometrial hyperplasia without atypia, simple 09/10/2012   Endometrial hyperplasia without atypia, simple 09/10/2012   HSV-1 infection    HTN (hypertension)    Hyperlipidemia    Migraine    PONV (postoperative nausea and vomiting)    Pre-diabetes    Recurrent upper respiratory infection (URI)    Sleep apnea with use of continuous positive airway pressure (CPAP)    Urticaria     Past Surgical History:  Procedure Laterality Date   ACHILLES TENDON  SURGERY Right 10/19/2020   Dr. Al Corpus   acl replacement     BILATERAL SALPINGECTOMY  09/10/2012   Procedure: BILATERAL SALPINGECTOMY;  Surgeon: Robley Fries, MD;  Location: [facility];  Service: Gynecology;  Laterality: Bilateral;   CERVICAL DISC SURGERY  2012   fusion, Dr. Jeral Fruit   CHOLECYSTECTOMY N/A 01/19/2023   Procedure: LAPAROSCOPIC CHOLECYSTECTOMY WITH ICG DYE;  Surgeon: Gaynelle Adu, MD;  Location: [facility];  Service: General;  Laterality: N/A;   COLONOSCOPY  2011   De Quervian's release Bilateral 2019   Dr. Mina Marble    ROBOTIC ASSISTED TOTAL HYSTERECTOMY  09/10/2012   Procedure: ROBOTIC ASSISTED TOTAL HYSTERECTOMY;  Surgeon: Robley Fries, MD;  Location: [facility];  Service: Gynecology;  Laterality: N/A;  3 hrs.   TUBAL LIGATION  1994    Family History  Problem Relation Age of Onset  Hypertension Mother    Stroke Mother    Breast cancer Mother    Lung cancer Mother    Alzheimer's disease Father    Hyperlipidemia Brother    Heart disease Brother    Diabetes Brother    Hypertension Brother    Hypertension Brother    CVA Brother 33   Asthma Daughter    Cancer Maternal Grandfather    Esophageal cancer Neg Hx    Colon cancer Neg Hx    Colon polyps Neg Hx    Stomach cancer Neg Hx    Rectal cancer Neg Hx     Social History   Socioeconomic History   Marital status: Married    Spouse name: Molly Maduro   Number of children: 3   Years of education: Not on file   Highest education level: Bachelor's degree (e.g., BA, AB, BS)  Occupational History   Not on file  Tobacco Use   Smoking status: Never    Passive exposure: Past   Smokeless tobacco: Never   Tobacco comments:    tobacco use - no   Vaping Use   Vaping status: Never Used  Substance and Sexual Activity   Alcohol use: Yes    Comment: Once every month or two   Drug use: No   Sexual activity: Not Currently    Partners: Male    Birth control/protection: Post-menopausal, Surgical  Other Topics Concern   Not on file   Social History Narrative   Married    Lives at home with husband   Right handed   Caffeine: 3 cups of tea with same tea bag.    full time, gets regular exercise.    Social Drivers of Corporate investment banker Strain: Low Risk  (12/28/2023)   Overall Financial Resource Strain (CARDIA)    Difficulty of Paying Living Expenses: Not hard at all  Food Insecurity: No Food Insecurity (12/28/2023)   Hunger Vital Sign    Worried About Running Out of Food in the Last Year: Never true    Ran Out of Food in the Last Year: Never true  Transportation Needs: No Transportation Needs (12/28/2023)   PRAPARE - Administrator, Civil Service (Medical): No    Lack of Transportation (Non-Medical): No  Physical Activity: Insufficiently Active (12/28/2023)   Exercise Vital Sign    Days of Exercise per Week: 4 days    Minutes of Exercise per Session: 30 min  Stress: No Stress Concern Present (12/28/2023)   [employer] of Occupational Health - Occupational Stress Questionnaire    Feeling of Stress : Only a little  Social Connections: Socially Integrated (12/29/2023)   Social Connection and Isolation Panel [NHANES]    Frequency of Communication with Friends and Family: More than three times a week    Frequency of Social Gatherings with Friends and Family: More than three times a week    Attends Religious Services: More than 4 times per year    Active Member of [employer] or Organizations: Yes    Attends Engineer, structural: More than 4 times per year    Marital Status: Married  Catering manager Violence: Not At Risk (12/29/2023)   Humiliation, Afraid, Rape, and Kick questionnaire    Fear of Current or Ex-Partner: No    Emotionally Abused: No    Physically Abused: No    Sexually Abused: No    ROS See HPI above    Objective  BP 130/72   Pulse 70  Temp 97.9 F (36.6 C) (Oral)   Ht 5\' 6"  (1.676 m)   Wt 165 lb (74.8 kg)   LMP 09/05/2012   SpO2 95%   BMI 26.63 kg/m   Physical  Exam Vitals reviewed.  Constitutional:      General: She is not in acute distress.    Appearance: Normal appearance. She is overweight. She is not ill-appearing, toxic-appearing or diaphoretic.  HENT:     Head: Normocephalic and atraumatic.  Eyes:     General:        Right eye: No discharge.        Left eye: No discharge.     Conjunctiva/sclera: Conjunctivae normal.  Cardiovascular:     Rate and Rhythm: Normal rate and regular rhythm.     Heart sounds: Normal heart sounds. No murmur heard.    No friction rub. No gallop.  Pulmonary:     Effort: Pulmonary effort is normal. No respiratory distress.     Breath sounds: Normal breath sounds.  Musculoskeletal:        General: Normal range of motion.  Skin:    General: Skin is warm and dry.  Neurological:     General: No focal deficit present.     Mental Status: She is alert and oriented to person, place, and time. Mental status is at baseline.  Psychiatric:        Mood and Affect: Mood normal.        Behavior: Behavior normal.        Thought Content: Thought content normal.        Judgment: Judgment normal.      Assessment & Plan:  Essential hypertension Assessment & Plan: Stable. Continue with Olmesartan 40mg  daily. Ordered CMP to assess kidney function.   Orders: -     Comprehensive metabolic panel with GFR; Future  Hyperlipidemia, unspecified hyperlipidemia type Assessment & Plan: Stable. Continue Ezetimibe 10mg  daily. Ordered CMP to assess liver function and lipid panel. Patient is not fasting and going to make a lab appointment when fasting.   Orders: -     Comprehensive metabolic panel with GFR; Future -     Lipid panel; Future  DDD (degenerative disc disease), cervical Assessment & Plan: Uncontrolled. Patient has been prescribed Meloxicam 15mg  daily, but has not took medication in months; and Flexeril 5mg  TID as needed, but does not take as often. Refilled Meloxicam to see if this would help with her pain. CMP to  assess kidney function with restarting Meloxicam.   Orders: -     Meloxicam; TAKE 1 TABLET EVERY OTHER DAY AS NEEDED FOR PAIN Do not take with ibuprofen OR aleve  Dispense: 20 tablet; Refill: 1 -     Comprehensive metabolic panel with GFR; Future  Chronic left shoulder pain Assessment & Plan: Uncontrolled. Patient has been prescribed Meloxicam 15mg  daily, but has not took medication in months; and Flexeril 5mg  TID as needed, but does not take as often. Refilled Meloxicam to see if this would help with her pain. CMP to assess kidney function with restarting Meloxicam.  Orders: -     Meloxicam; TAKE 1 TABLET EVERY OTHER DAY AS NEEDED FOR PAIN Do not take with ibuprofen OR aleve  Dispense: 20 tablet; Refill: 1  Anxiety Assessment & Plan: Stable. Patient is prescribed Alprazolam 0.5mg  BID as needed. However, patient does not take it frequently. Patient does not think she will need it refilled before her next appointment in 6 months. If she needs a refill, she  would need a urine drug screen and controlled substance contract signed.    Medication management -     Ondansetron; Take 1 tablet (4 mg total) by mouth every 8 (eight) hours as needed for nausea or vomiting.  Dispense: 30 tablet; Refill: 0 -     Comprehensive metabolic panel with GFR; Future  Nausea -     Ondansetron; Take 1 tablet (4 mg total) by mouth every 8 (eight) hours as needed for nausea or vomiting.  Dispense: 30 tablet; Refill: 0  Prediabetes -     Hemoglobin A1c; Future  HSV-1 infection Assessment & Plan: Stable. Prescribed Valtrex 500mg  daily, but patient takes medication as needed. Continue medication as needed.    Overweight (BMI 25.0-29.9) Assessment & Plan: Stable. Patient is prescribed Semaglutide 2.4 mg weekly and Zofran 4mg  ODT as needed for nausea with taking injections. Patient is losing weight based on her weights for the last 3 encounters. Refilled Zofran.    Encounter to establish care   1.Review  health maintenance:  -Colonoscopy: Needs to schedule  -Covid booster: Past fall  -Bone Density: Not had  2.Ordered A1c for prediabetes. Last A1c was 5.7. She is currently on Semaglutide [facility] for weight loss since she does not qualify for Ozempic.  Return in about 7 months (around 07/30/2024) for physical; Lab appt when fasting .   Zandra Abts, NP

## 2023-12-29 NOTE — Assessment & Plan Note (Signed)
 Stable. Continue Ezetimibe 10mg  daily. Ordered CMP to assess liver function and lipid panel. Patient is not fasting and going to make a lab appointment when fasting.

## 2023-12-29 NOTE — Assessment & Plan Note (Signed)
 Stable. Prescribed Valtrex 500mg  daily, but patient takes medication as needed. Continue medication as needed.

## 2023-12-29 NOTE — Patient Instructions (Addendum)
-  It was nice to meet you and look forward to taking care of you. -Ordered labs. Please make a lab appointment and please be fasting.  -Refilled Meloxicam and Zofran.  -Continue all prescribed medication.  -Please call [facility] to schedule your colonoscopy.  -Follow up in 6 months for a physical.

## 2023-12-29 NOTE — Assessment & Plan Note (Signed)
 Stable. Patient is prescribed Semaglutide 2.4 mg weekly and Zofran 4mg  ODT as needed for nausea with taking injections. Patient is losing weight based on her weights for the last 3 encounters. Refilled Zofran.

## 2023-12-29 NOTE — Assessment & Plan Note (Signed)
 Stable. Patient is prescribed Alprazolam 0.5mg  BID as needed. However, patient does not take it frequently. Patient does not think she will need it refilled before her next appointment in 6 months. If she needs a refill, she would need a urine drug screen and controlled substance contract signed.

## 2023-12-31 ENCOUNTER — Other Ambulatory Visit: Payer: Self-pay | Admitting: Family Medicine

## 2024-01-06 ENCOUNTER — Other Ambulatory Visit (INDEPENDENT_AMBULATORY_CARE_PROVIDER_SITE_OTHER)

## 2024-01-06 ENCOUNTER — Encounter: Payer: Self-pay | Admitting: Family Medicine

## 2024-01-06 DIAGNOSIS — Z79899 Other long term (current) drug therapy: Secondary | ICD-10-CM

## 2024-01-06 DIAGNOSIS — M5030 Other cervical disc degeneration, unspecified cervical region: Secondary | ICD-10-CM | POA: Diagnosis not present

## 2024-01-06 DIAGNOSIS — R7303 Prediabetes: Secondary | ICD-10-CM | POA: Diagnosis not present

## 2024-01-06 DIAGNOSIS — E785 Hyperlipidemia, unspecified: Secondary | ICD-10-CM | POA: Diagnosis not present

## 2024-01-06 DIAGNOSIS — I10 Essential (primary) hypertension: Secondary | ICD-10-CM

## 2024-01-06 LAB — COMPREHENSIVE METABOLIC PANEL WITH GFR
ALT: 19 U/L (ref 0–35)
AST: 24 U/L (ref 0–37)
Albumin: 4.8 g/dL (ref 3.5–5.2)
Alkaline Phosphatase: 96 U/L (ref 39–117)
BUN: 20 mg/dL (ref 6–23)
CO2: 29 meq/L (ref 19–32)
Calcium: 9.7 mg/dL (ref 8.4–10.5)
Chloride: 102 meq/L (ref 96–112)
Creatinine, Ser: 0.72 mg/dL (ref 0.40–1.20)
GFR: 88.62 mL/min (ref >60.00)
Glucose, Bld: 96 mg/dL (ref 70–99)
Potassium: 3.9 meq/L (ref 3.5–5.1)
Sodium: 139 meq/L (ref 135–145)
Total Bilirubin: 0.5 mg/dL (ref 0.2–1.2)
Total Protein: 7.4 g/dL (ref 6.0–8.3)

## 2024-01-06 LAB — LIPID PANEL
Cholesterol: 173 mg/dL (ref 0–200)
HDL: 40 mg/dL (ref >39.00)
LDL Cholesterol: 106 mg/dL — ABNORMAL HIGH (ref 0–99)
NonHDL: 133.46
Total CHOL/HDL Ratio: 4
Triglycerides: 138 mg/dL (ref 0.0–149.0)
VLDL: 27.6 mg/dL (ref 0.0–40.0)

## 2024-01-06 LAB — HEMOGLOBIN A1C: Hgb A1c MFr Bld: 5.6 % (ref 4.6–6.5)

## 2024-01-18 ENCOUNTER — Ambulatory Visit: Payer: BC Managed Care – PPO | Admitting: Nurse Practitioner

## 2024-02-16 NOTE — Progress Notes (Unsigned)
 Natasha Fritz

## 2024-02-16 NOTE — Patient Instructions (Signed)

## 2024-02-16 NOTE — Progress Notes (Unsigned)
 PATIENT: Natasha Fritz DOB: 1961-06-27  REASON FOR VISIT: follow up HISTORY FROM: patient  No chief complaint on file.    HISTORY OF PRESENT ILLNESS:  02/16/24 ALL:  Natasha Fritz returns for follow up for OSA on CPAP.     02/11/2023 ALL:  Natasha Fritz returns for follow up for OSA on CPAP. She continues therapy but does not like to use her CPAP. She does note less snoring. Sleep waxes and wanes. She continues to use medium nasal pillow but feels she needs a small. She is planning to reach out to DME to request different mask. Air leak is not bothersome. She does have dry mouth. She has a chin strap but doesn't use. She is s/p gallbladder removal. She is recovering well.     02/05/2022 ALL: Natasha Fritz is a 63 y.o. female here today for follow up for OSA on CPAP. She was seen in consult with Dr Albertina Hugger 09/2021 for snoring and fatigue. HST 11/06/2021 showed moderately severe OSA with total AHI 28.8/hr, REM AHI 32.1/hr an no significant hypoxia. AutoPAP ordered. Since, she is doing fairly well. She does not love using CPAP but does note that she wakes up less often at night. Her husband has told her that she no longer snores. She has noticed a leak with small nasal pillow but reports that she has not changed mask since starting therapy.     HISTORY: (copied from Dr Dohmeier's previous note)  Natasha Fritz is a 63 y.o.  patient who is seen here upon referral on 10/01/2021 from Dr. Cassondra Cliff for a Sleep consultation. .  Chief concern according to patient :  Internal referral for snoring and fatigue. Here to r/o OSA. No prior SS. Pt noticed snoring about 3 years ago. Works from home.     Natasha Fritz  has a past medical history of Achilles tendinitis, Acid reflux (07/09/2020), Anxiety, Basal cell carcinoma of nose (05/23/2015), Endometrial hyperplasia without atypia, simple (09/10/2012), Endometrial hyperplasia without atypia, simple (09/10/2012), HSV-1 infection, HTN (hypertension), Hyperlipidemia,  Migraine, and PONV (postoperative nausea and vomiting). Achilles tendon surgery 09-2020.    Sleep relevant medical history: snoring for about 3 years ( weight gain  pandemic) since mother's death in November 28, 2018.   Nocturia 1-3, no Tonsillectomy no other ENT surgery, cervical spine surgery anterior fusion.   Family medical /sleep history: Father with OSA.Natasha Fritz    Social history:  Patient is working as Consulting civil engineer for [employer] and lives in a household with spouse, adult 3 children-  one dog.  The patient currently works from home , often at night, too. 10 hour shifts, on call duties.  Tobacco use; never .  ETOH use ; social ,  Caffeine intake in form of Coffee( /) Soda( /) Tea ( 2-3 cups in Am and hot tea) ,nor energy drinks. Regular exercise in form of walking the dog.     Sleep habits are as follows: The patient's dinner time is between 6-7 PM. The patient goes to bed at 8-12 PM and continues to sleep for 2-4 hours, wakes for 1-3 bathroom breaks.   The preferred sleep position is on her sides, with the support of 1-2 pillows. Snores in supine and on her side.  Dreams are reportedly infrequent.Natasha Fritz  6  AM is the usual rise time. The patient wakes up with an alarm.  She reports not feeling refreshed or restored in AM, with symptoms such as dry mouth, morning headaches, and residual fatigue.  Naps are  taken infrequently, lasting from 60 to 120 minutes and are not refreshing.    REVIEW OF SYSTEMS: Out of a complete 14 system review of symptoms, the patient complains only of the following symptoms, fatigue and all other reviewed systems are negative.  ESS: 10/24  ALLERGIES: Allergies  Allergen Reactions   Penicillins Hives    REACTION: hives reaction seen in 1993 per patient hives were "all over" no SOB, no facial swelling, no dizziness / syncope   Pravastatin  Other (See Comments)    myalgias    HOME MEDICATIONS: Outpatient Medications Prior to Visit  Medication Sig Dispense Refill   ALPRAZolam   (XANAX ) 0.5 MG tablet TAKE 1 TABLET BY MOUTH 2 TIMES DAILY AS NEEDED (Patient taking differently: Take 0.5 mg by mouth daily as needed for anxiety or sleep.) 60 tablet 0   aspirin 81 MG EC tablet Take 81 mg by mouth daily.     azelastine  (ASTELIN ) 0.1 % nasal spray Place 1 spray into both nostrils 2 (two) times daily. Use in each nostril as directed (Patient taking differently: Place 1 spray into both nostrils 2 (two) times daily as needed for allergies. Use in each nostril as directed) 30 mL 11   cetirizine  (ZYRTEC  ALLERGY ) 10 MG tablet Take 1 tablet (10 mg total) by mouth daily as needed for allergies. (Patient taking differently: Take 10 mg by mouth daily.) 30 tablet 11   CHELATED MAGNESIUM PO Take 72 mg by mouth daily.     cholecalciferol (VITAMIN D ) 1000 UNITS tablet Take 1,000 Units by mouth daily.     cyclobenzaprine  (FLEXERIL ) 5 MG tablet Take 1 tablet (5 mg total) by mouth 3 (three) times daily as needed for muscle spasms. 60 tablet 0   ezetimibe  (ZETIA ) 10 MG tablet Take 1 tablet (10 mg total) by mouth daily. 90 tablet 0   fluticasone  (FLONASE ) 50 MCG/ACT nasal spray Place 2 sprays into both nostrils daily. 16 g 11   ketoconazole (NIZORAL) 2 % cream Apply 1 application  topically daily as needed for irritation.     meloxicam  (MOBIC ) 15 MG tablet TAKE 1 TABLET EVERY OTHER DAY AS NEEDED FOR PAIN Do not take with ibuprofen  OR aleve 20 tablet 1   Multiple Vitamin (MULTIVITAMIN) tablet Take 1 tablet by mouth daily.     olmesartan  (BENICAR ) 40 MG tablet TAKE 1/2 TABLET DAILY FOR BLOOD PRESSURE     Omega 3-6-9 Fatty Acids (OMEGA 3-6-9 PO) Take 3,600 mg by mouth daily.     ondansetron  (ZOFRAN -ODT) 4 MG disintegrating tablet Take 1 tablet (4 mg total) by mouth every 8 (eight) hours as needed for nausea or vomiting. 30 tablet 0   oxyCODONE -acetaminophen  (PERCOCET/ROXICET) 5-325 MG tablet Take 1 tablet by mouth every 6 (six) hours as needed for severe pain. 12 tablet 0   valACYclovir  (VALTREX ) 500 MG  tablet Take 1 tablet daily-prophylaxis for Cold Sores/Fever Blisters 90 tablet 0   venlafaxine XR (EFFEXOR-XR) 37.5 MG 24 hr capsule Take 37.5 mg by mouth daily. For hot flashes.     WEGOVY  2.4 MG/0.75ML SOAJ Inject one pen once weekly 3 mL 3   No facility-administered medications prior to visit.    PAST MEDICAL HISTORY: Past Medical History:  Diagnosis Date   Achilles tendinitis    Acid reflux 07/09/2020   Anemia    Anxiety    Basal cell carcinoma of nose 05/23/2015   Endometrial hyperplasia without atypia, simple 09/10/2012   Endometrial hyperplasia without atypia, simple 09/10/2012   HSV-1 infection  HTN (hypertension)    Hyperlipidemia    Migraine    PONV (postoperative nausea and vomiting)    Pre-diabetes    Recurrent upper respiratory infection (URI)    Sleep apnea with use of continuous positive airway pressure (CPAP)    Urticaria     PAST SURGICAL HISTORY: Past Surgical History:  Procedure Laterality Date   ACHILLES TENDON SURGERY Right 10/19/2020   Dr. Lara Plants   acl replacement     BILATERAL SALPINGECTOMY  09/10/2012   Procedure: BILATERAL SALPINGECTOMY;  Surgeon: Shasta Deist, MD;  Location: [facility];  Service: Gynecology;  Laterality: Bilateral;   CERVICAL DISC SURGERY  2012   fusion, Dr. Rica Chalet   CHOLECYSTECTOMY N/A 01/19/2023   Procedure: LAPAROSCOPIC CHOLECYSTECTOMY WITH ICG DYE;  Surgeon: Aldean Hummingbird, MD;  Location: [facility];  Service: General;  Laterality: N/A;   COLONOSCOPY  2011   De Quervian's release Bilateral 2019   Dr. Donzella Galley    ROBOTIC ASSISTED TOTAL HYSTERECTOMY  09/10/2012   Procedure: ROBOTIC ASSISTED TOTAL HYSTERECTOMY;  Surgeon: Shasta Deist, MD;  Location: [facility];  Service: Gynecology;  Laterality: N/A;  3 hrs.   TUBAL LIGATION  1994    FAMILY HISTORY: Family History  Problem Relation Age of Onset   Hypertension Mother    Stroke Mother    Breast cancer Mother    Lung cancer Mother    Alzheimer's disease Father    Hyperlipidemia  Brother    Heart disease Brother    Diabetes Brother    Hypertension Brother    Hypertension Brother    CVA Brother 83   Asthma Daughter    Cancer Maternal Grandfather    Esophageal cancer Neg Hx    Colon cancer Neg Hx    Colon polyps Neg Hx    Stomach cancer Neg Hx    Rectal cancer Neg Hx     SOCIAL HISTORY: Social History   Socioeconomic History   Marital status: Married    Spouse name: Porfirio Bristol   Number of children: 3   Years of education: Not on file   Highest education level: Bachelor's degree (e.g., BA, AB, BS)  Occupational History   Not on file  Tobacco Use   Smoking status: Never    Passive exposure: Past   Smokeless tobacco: Never   Tobacco comments:    tobacco use - no   Vaping Use   Vaping status: Never Used  Substance and Sexual Activity   Alcohol use: Yes    Comment: Once every month or two   Drug use: No   Sexual activity: Not Currently    Partners: Male    Birth control/protection: Post-menopausal, Surgical  Other Topics Concern   Not on file  Social History Narrative   Married    Lives at home with husband   Right handed   Caffeine: 3 cups of tea with same tea bag.    full time, gets regular exercise.    Social Drivers of Corporate investment banker Strain: Low Risk  (12/28/2023)   Overall Financial Resource Strain (CARDIA)    Difficulty of Paying Living Expenses: Not hard at all  Food Insecurity: No Food Insecurity (12/28/2023)   Hunger Vital Sign    Worried About Running Out of Food in the Last Year: Never true    Ran Out of Food in the Last Year: Never true  Transportation Needs: No Transportation Needs (12/28/2023)   PRAPARE - Administrator, Civil Service (Medical): No  Lack of Transportation (Non-Medical): No  Physical Activity: Insufficiently Active (12/28/2023)   Exercise Vital Sign    Days of Exercise per Week: 4 days    Minutes of Exercise per Session: 30 min  Stress: No Stress Concern Present (12/28/2023)   [employer] of Occupational Health - Occupational Stress Questionnaire    Feeling of Stress : Only a little  Social Connections: Socially Integrated (12/29/2023)   Social Connection and Isolation Panel [NHANES]    Frequency of Communication with Friends and Family: More than three times a week    Frequency of Social Gatherings with Friends and Family: More than three times a week    Attends Religious Services: More than 4 times per year    Active Member of [employer] or Organizations: Yes    Attends Engineer, structural: More than 4 times per year    Marital Status: Married  Catering manager Violence: Not At Risk (12/29/2023)   Humiliation, Afraid, Rape, and Kick questionnaire    Fear of Current or Ex-Partner: No    Emotionally Abused: No    Physically Abused: No    Sexually Abused: No     PHYSICAL EXAM  There were no vitals filed for this visit.   There is no height or weight on file to calculate BMI.  Generalized: Well developed, in no acute distress  Cardiology: normal rate and rhythm, no murmur noted Respiratory: clear to auscultation bilaterally  Neurological examination  Mentation: Alert oriented to time, place, history taking. Follows all commands speech and language fluent Cranial nerve II-XII: Pupils were equal round reactive to light. Extraocular movements were full, visual field were full  Motor: The motor testing reveals 5 over 5 strength of all 4 extremities. Good symmetric motor tone is noted throughout.  Gait and station: Gait is normal.    DIAGNOSTIC DATA (LABS, IMAGING, TESTING) - I reviewed patient records, labs, notes, testing and imaging myself where available.      No data to display           Lab Results  Component Value Date   WBC 6.6 07/20/2023   HGB 12.8 07/20/2023   HCT 39.0 07/20/2023   MCV 90.5 07/20/2023   PLT 274 07/20/2023      Component Value Date/Time   NA 139 01/06/2024 0841   K 3.9 01/06/2024 0841   CL 102 01/06/2024 0841    CO2 29 01/06/2024 0841   GLUCOSE 96 01/06/2024 0841   BUN 20 01/06/2024 0841   CREATININE 0.72 01/06/2024 0841   CREATININE 0.81 07/20/2023 0949   CALCIUM 9.7 01/06/2024 0841   PROT 7.4 01/06/2024 0841   ALBUMIN 4.8 01/06/2024 0841   AST 24 01/06/2024 0841   ALT 19 01/06/2024 0841   ALKPHOS 96 01/06/2024 0841   BILITOT 0.5 01/06/2024 0841   GFRNONAA >60 01/19/2023 1539   GFRNONAA 87 01/11/2021 0929   GFRAA 101 01/11/2021 0929   Lab Results  Component Value Date   CHOL 173 01/06/2024   HDL 40.00 01/06/2024   LDLCALC 106 (H) 01/06/2024   TRIG 138.0 01/06/2024   CHOLHDL 4 01/06/2024   Lab Results  Component Value Date   HGBA1C 5.6 01/06/2024   Lab Results  Component Value Date   VITAMINB12 699 07/20/2023   Lab Results  Component Value Date   TSH 1.83 07/20/2023     ASSESSMENT AND PLAN 63 y.o. year old female  has a past medical history of Achilles tendinitis, Acid reflux (07/09/2020), Anemia, Anxiety, Basal  cell carcinoma of nose (05/23/2015), Endometrial hyperplasia without atypia, simple (09/10/2012), Endometrial hyperplasia without atypia, simple (09/10/2012), HSV-1 infection, HTN (hypertension), Hyperlipidemia, Migraine, PONV (postoperative nausea and vomiting), Pre-diabetes, Recurrent upper respiratory infection (URI), Sleep apnea with use of continuous positive airway pressure (CPAP), and Urticaria. here with   No diagnosis found.     Natasha Fritz is doing well on CPAP therapy. Compliance report reveals excellent compliance. There is a large air leak noted. She was encouraged to change out mask regularly and monitor for worsening leak at home. AHI is well managed. She was encouraged to continue using CPAP nightly and for greater than 4 hours each night. We will update supply orders as indicated. Risks of untreated sleep apnea review and education materials provided. Healthy lifestyle habits encouraged. She will follow up in 1 year, sooner if needed. She verbalizes  understanding and agreement with this plan.    No orders of the defined types were placed in this encounter.    No orders of the defined types were placed in this encounter.     Terrilyn Fick, FNP-C 02/16/2024, 9:14 AM [facility] [address] [place], [place] [address] [phone]

## 2024-02-17 ENCOUNTER — Ambulatory Visit (INDEPENDENT_AMBULATORY_CARE_PROVIDER_SITE_OTHER): Payer: BC Managed Care – PPO | Admitting: Family Medicine

## 2024-02-17 ENCOUNTER — Encounter: Payer: Self-pay | Admitting: Family Medicine

## 2024-02-17 VITALS — BP 130/80 | HR 60 | Ht 66.0 in | Wt 166.8 lb

## 2024-02-17 DIAGNOSIS — G4733 Obstructive sleep apnea (adult) (pediatric): Secondary | ICD-10-CM | POA: Diagnosis not present

## 2024-03-03 ENCOUNTER — Other Ambulatory Visit: Payer: Self-pay | Admitting: Family

## 2024-03-09 ENCOUNTER — Encounter: Payer: Self-pay | Admitting: Gastroenterology

## 2024-03-28 ENCOUNTER — Other Ambulatory Visit (HOSPITAL_COMMUNITY): Payer: Self-pay

## 2024-04-01 ENCOUNTER — Other Ambulatory Visit: Payer: Self-pay

## 2024-04-01 NOTE — Telephone Encounter (Signed)
 [facility]irizine  10 mg. Patient has not been seen since 12/02/22. Denied refill. Pt needs appointment.

## 2024-04-04 ENCOUNTER — Other Ambulatory Visit: Payer: Self-pay

## 2024-04-04 DIAGNOSIS — E663 Overweight: Secondary | ICD-10-CM

## 2024-04-04 MED ORDER — WEGOVY 2.4 MG/0.75ML ~~LOC~~ SOAJ: SUBCUTANEOUS | 3 refills | Status: DC

## 2024-04-08 ENCOUNTER — Other Ambulatory Visit: Payer: Self-pay | Admitting: Internal Medicine

## 2024-04-12 ENCOUNTER — Other Ambulatory Visit: Payer: Self-pay

## 2024-04-12 ENCOUNTER — Encounter: Payer: Self-pay | Admitting: Family Medicine

## 2024-04-12 MED ORDER — EZETIMIBE 10 MG PO TABS: 10.0000 mg | ORAL_TABLET | Freq: Every day | ORAL | 1 refills | Status: DC

## 2024-04-21 ENCOUNTER — Other Ambulatory Visit: Payer: Self-pay | Admitting: Family Medicine

## 2024-04-21 DIAGNOSIS — I10 Essential (primary) hypertension: Secondary | ICD-10-CM

## 2024-04-21 NOTE — Telephone Encounter (Signed)
 Copied from CRM #[id number]. Topic: Clinical - Medication Refill >> Apr 21, 2024 10:08 AM Larissa S wrote: Medication: olmesartan  (BENICAR ) 40 MG tablet  Has the patient contacted their pharmacy? Yes (Agent: If no, request that the patient contact the pharmacy for the refill. If patient does not wish to contact the pharmacy document the reason why and proceed with request.) (Agent: If yes, when and what did the pharmacy advise?)  This is the patient's preferred pharmacy:    [facility] - [place], [place] - [address] [place] [address] Phone: [phone] Fax: [phone]  Is this the correct pharmacy for this prescription? Yes If no, delete pharmacy and type the correct one.   Has the prescription been filled recently? No  Is the patient out of the medication? No  Has the patient been seen for an appointment in the last year OR does the patient have an upcoming appointment? Yes  Can we respond through MyChart? No  Agent: Please be advised that Rx refills may take up to 3 business days. We ask that you follow-up with your pharmacy.

## 2024-04-22 ENCOUNTER — Other Ambulatory Visit: Payer: Self-pay | Admitting: Family Medicine

## 2024-04-22 DIAGNOSIS — I10 Essential (primary) hypertension: Secondary | ICD-10-CM

## 2024-04-22 NOTE — Telephone Encounter (Signed)
 Copied from CRM [id number]. Topic: Clinical - Medication Refill >> Apr 22, 2024 10:13 AM Suzen RAMAN wrote: Medication: olmesartan  (BENICAR ) 40 MG tablet  Has the patient contacted their pharmacy? Yes  [facility] - [place], [place] - [address] [place] [address] Phone: [phone] Fax: [phone]  Is this the correct pharmacy for this prescription? Yes If no, delete pharmacy and type the correct one.   Has the prescription been filled recently? No  Is the patient out of the medication? No  Has the patient been seen for an appointment in the last year OR does the patient have an upcoming appointment? Yes  Can we respond through MyChart? Yes  Agent: Please be advised that Rx refills may take up to 3 business days. We ask that you follow-up with your pharmacy.

## 2024-04-23 ENCOUNTER — Other Ambulatory Visit (HOSPITAL_COMMUNITY): Payer: Self-pay

## 2024-04-25 MED ORDER — OLMESARTAN MEDOXOMIL 40 MG PO TABS: ORAL_TABLET | ORAL | Status: DC

## 2024-04-27 ENCOUNTER — Encounter: Payer: Self-pay | Admitting: Gastroenterology

## 2024-04-27 ENCOUNTER — Ambulatory Visit (AMBULATORY_SURGERY_CENTER)

## 2024-04-27 VITALS — Ht 66.0 in | Wt 160.0 lb

## 2024-04-27 DIAGNOSIS — Z860100 Personal history of colon polyps, unspecified: Secondary | ICD-10-CM

## 2024-04-27 MED ORDER — NA SULFATE-K SULFATE-MG SULF 17.5-3.13-1.6 GM/177ML PO SOLN: 1.0000 | Freq: Once | ORAL | 0 refills | Status: AC

## 2024-04-27 NOTE — Progress Notes (Signed)
 No egg or soy allergy  known to patient  No issues known to pt with past sedation with any surgeries or procedures- N/V after last colonoscopy and states she Passed out  Patient denies ever being told they had issues or difficulty with intubation  No FH of Malignant Hyperthermia  Pt is not on diet pills - Currently on a GLP1; hold instructions provided  Pt is not on  home 02  Pt is not on blood thinners  Pt has intermittent issues with constipation No A fib or A flutter Have any cardiac testing pending--No Pt can ambulate  Pt denies use of chewing tobacco Discussed diabetic I weight loss medication holds Discussed NSAID holds Checked BMI Pt instructed to use Singlecare.com or GoodRx for a price reduction on prep  Patient's chart reviewed by Norleen Schillings CNRA prior to previsit and patient appropriate for the [facility].  Pre visit completed and red dot placed by patient's name on their procedure day (on provider's schedule).

## 2024-04-29 ENCOUNTER — Other Ambulatory Visit: Payer: Self-pay

## 2024-04-29 DIAGNOSIS — I10 Essential (primary) hypertension: Secondary | ICD-10-CM

## 2024-04-29 MED ORDER — OLMESARTAN MEDOXOMIL 40 MG PO TABS: ORAL_TABLET | ORAL | 0 refills | Status: DC

## 2024-05-03 ENCOUNTER — Telehealth: Payer: Self-pay | Admitting: Gastroenterology

## 2024-05-03 ENCOUNTER — Encounter: Payer: Self-pay | Admitting: Gastroenterology

## 2024-05-03 DIAGNOSIS — Z860100 Personal history of colon polyps, unspecified: Secondary | ICD-10-CM

## 2024-05-03 NOTE — Telephone Encounter (Signed)
 If no fever, and if patient feels that she is stable/improving over course of today and wants to proceed with her Colonoscopy, I am OK with this if she is negative for COVID. However, at time, patients who may have certain URIs could be at increased risk of anesthesia related complications.  This is a surveillance colonoscopy procedure, so if she would like to reschedule I am OK for her to be as optimized as possible and there would be no late-cancellation fee as she is sick. Let me know what she decides. GM

## 2024-05-03 NOTE — Telephone Encounter (Signed)
 Called pt to inform her of MD recommendations. She states she is feeling worse since we spoke early and that she does not do well with anesthesia. She decided that it would be best to reschedule to allow her time to recover. Rescheduled to 06/03/24 at 1:30p. Sent new instructions to pt via MyChart, she states she has access. Pt still has prep at home. Reviewed over the phone when to hold Wegovy . Pt verbalized understanding.

## 2024-05-03 NOTE — Telephone Encounter (Signed)
 Harlene, Thank you for this update.  Very reasonable. GM

## 2024-05-03 NOTE — Addendum Note (Signed)
 Addended by: DOMENIC RAISIN L on: 05/03/2024 12:53 PM   Modules accepted: Orders

## 2024-05-03 NOTE — Telephone Encounter (Signed)
 Spoke with pt. States she has a sore throat and hoarseness that started yesterday evening, also reports nasal drainage. Denies a cough or shortness of breath. Pt states she does not feel she has a fever, but does not have a thermometer at home. Per previous note she did test for Covid and it was negative. RN encouraged pt to get a thermometer if possible to be sure she is not running a fever. Will ask MD for recommendations on whether to proceed with colonoscopy tomorrow. Pt instructed to call back if she develops a fever. Pt verbalized understanding.

## 2024-05-03 NOTE — Telephone Encounter (Signed)
 Inbound call from patient stating that she is scheduling to have a colonoscopy tomorrow at  12:30 with Dr. Wilhelmenia and wanted to advise that she has a sore throat. Patient states she tested for COVID and test was negative. Patient is requesting a call to discuss if she can still proceed with procedure. Please advise.

## 2024-05-04 ENCOUNTER — Encounter: Admitting: Gastroenterology

## 2024-05-13 ENCOUNTER — Ambulatory Visit: Admitting: Gastroenterology

## 2024-05-25 ENCOUNTER — Other Ambulatory Visit: Payer: Self-pay | Admitting: Obstetrics & Gynecology

## 2024-05-25 DIAGNOSIS — Z1231 Encounter for screening mammogram for malignant neoplasm of breast: Secondary | ICD-10-CM

## 2024-06-01 ENCOUNTER — Telehealth: Payer: Self-pay | Admitting: Gastroenterology

## 2024-06-01 NOTE — Telephone Encounter (Signed)
 Inbound call from patient stating she has an upcoming procedure on Friday 06/03/24 and made a mistake and ate m&ms with peanut. Patient wants to know if that's going to change anything with her procedure  Requesting a call back  Please advise  Thank you

## 2024-06-01 NOTE — Telephone Encounter (Signed)
 Pt stated that she accidentally ate a bag of m&m's peanut today, and wanted to know if she needed to reschedule procedure. Advised pt that she would be okay to proceed with procedure on Friday. Also advised to avoid peanuts for the next few days, and to drink plenty of fluids. Pt verbalized understanding and had no further concerns.

## 2024-06-03 ENCOUNTER — Ambulatory Visit (AMBULATORY_SURGERY_CENTER): Admitting: Gastroenterology

## 2024-06-03 ENCOUNTER — Encounter: Payer: Self-pay | Admitting: Gastroenterology

## 2024-06-03 VITALS — BP 165/95 | HR 61 | Temp 98.4°F | Resp 11 | Ht 66.0 in | Wt <= 1120 oz

## 2024-06-03 DIAGNOSIS — Z860101 Personal history of adenomatous and serrated colon polyps: Secondary | ICD-10-CM | POA: Diagnosis not present

## 2024-06-03 DIAGNOSIS — Z1211 Encounter for screening for malignant neoplasm of colon: Secondary | ICD-10-CM | POA: Diagnosis not present

## 2024-06-03 DIAGNOSIS — K562 Volvulus: Secondary | ICD-10-CM | POA: Diagnosis not present

## 2024-06-03 DIAGNOSIS — D122 Benign neoplasm of ascending colon: Secondary | ICD-10-CM | POA: Diagnosis not present

## 2024-06-03 DIAGNOSIS — K641 Second degree hemorrhoids: Secondary | ICD-10-CM | POA: Diagnosis not present

## 2024-06-03 DIAGNOSIS — Z860100 Personal history of colon polyps, unspecified: Secondary | ICD-10-CM

## 2024-06-03 MED ORDER — DEXTROSE 5 % IV SOLN: INTRAVENOUS | Status: AC

## 2024-06-03 MED ORDER — SODIUM CHLORIDE 0.9 % IV SOLN: 500.0000 mL | INTRAVENOUS | Status: DC

## 2024-06-03 NOTE — Op Note (Signed)
 [facility] Patient Name: Natasha Fritz Procedure Date: 06/03/2024 1:56 PM MRN: [id number] Endoscopist: Aloha Finner , MD, [id number] Age: 63 Referring MD:  Date of Birth: Dec 15, 1960 Gender: Female Account #: [id number] Procedure:                Colonoscopy Indications:              Surveillance: Personal history of adenomatous                            polyps on last colonoscopy > 3 years ago Medicines:                Monitored Anesthesia Care Procedure:                Pre-Anesthesia Assessment:                           - Prior to the procedure, a History and Physical                            was performed, and patient medications and                            allergies were reviewed. The patient's tolerance of                            previous anesthesia was also reviewed. The risks                            and benefits of the procedure and the sedation                            options and risks were discussed with the patient.                            All questions were answered, and informed consent                            was obtained. Prior Anticoagulants: The patient has                            taken no anticoagulant or antiplatelet agents. ASA                            Grade Assessment: II - A patient with mild systemic                            disease. After reviewing the risks and benefits,                            the patient was deemed in satisfactory condition to                            undergo the procedure.  After obtaining informed consent, the colonoscope                            was passed under direct vision. Throughout the                            procedure, the patient's blood pressure, pulse, and                            oxygen saturations were monitored continuously. The                            CF [id number] #[id number] was introduced through the anus                            and advanced to  the the cecum, identified by                            appendiceal orifice and ileocecal valve. The                            colonoscopy was performed without difficulty. The                            patient tolerated the procedure. The quality of the                            bowel preparation was adequate. The ileocecal                            valve, appendiceal orifice, and rectum were                            photographed. Scope In: 2:14:26 PM Scope Out: 2:36:33 PM Scope Withdrawal Time: 0 hours 12 minutes 19 seconds  Total Procedure Duration: 0 hours 22 minutes 7 seconds  Findings:                 The digital rectal exam findings include                            hemorrhoids. Pertinent negatives include no                            palpable rectal lesions.                           The colon (entire examined portion) revealed                            significantly excessive looping.                           Two sessile polyps were found in the ascending  colon. The polyps were 3 to 4 mm in size. These                            polyps were removed with a cold snare. Resection                            and retrieval were complete.                           Normal mucosa was found in the entire colon                            otherwise.                           Non-bleeding non-thrombosed external and internal                            hemorrhoids were found during retroflexion, during                            perianal exam and during digital exam. The                            hemorrhoids were Grade II (internal hemorrhoids                            that prolapse but reduce spontaneously). Complications:            No immediate complications. Estimated Blood Loss:     Estimated blood loss was minimal. Impression:               - Hemorrhoids found on digital rectal exam.                           - There was significant looping of the  colon.                           - Two 3 to 4 mm polyps in the ascending colon,                            removed with a cold snare. Resected and retrieved.                           - Normal mucosa in the entire examined colon                            otherwise.                           - Non-bleeding non-thrombosed external and internal                            hemorrhoids. Recommendation:           - The patient will be observed post-procedure,  until all discharge criteria are met.                           - Discharge patient to home.                           - Patient has a contact number available for                            emergencies. The signs and symptoms of potential                            delayed complications were discussed with the                            patient. Return to normal activities tomorrow.                            Written discharge instructions were provided to the                            patient.                           - High fiber diet.                           - Use FiberCon 1-2 tablets PO daily.                           - Continue present medications.                           - Await pathology results.                           - Repeat colonoscopy in 5-7 years for surveillance                            based on pathology results.                           - The findings and recommendations were discussed                            with the patient.                           - The findings and recommendations were discussed                            with the patient's family. Aloha Finner, MD 06/03/2024 2:48:36 PM

## 2024-06-03 NOTE — Progress Notes (Signed)
 To pacu, VSS. Report to [email or web address].tb

## 2024-06-03 NOTE — Patient Instructions (Addendum)
 Thank you for letting us  care for your healthcare needs today! Please see handouts regarding High Fiber Diet, Polyps, and Hemorrhoids. Use FiberCon 1-2 tabs by mouth daily. Continue current medications. Await pathology results.   YOU HAD AN ENDOSCOPIC PROCEDURE TODAY AT THE [facility]:   Refer to the procedure report that was given to you for any specific questions about what was found during the examination.  If the procedure report does not answer your questions, please call your gastroenterologist to clarify.  If you requested that your care partner not be given the details of your procedure findings, then the procedure report has been included in a sealed envelope for you to review at your convenience later.  YOU SHOULD EXPECT: Some feelings of bloating in the abdomen. Passage of more gas than usual.  Walking can help get rid of the air that was put into your GI tract during the procedure and reduce the bloating. If you had a lower endoscopy (such as a colonoscopy or flexible sigmoidoscopy) you may notice spotting of blood in your stool or on the toilet paper. If you underwent a bowel prep for your procedure, you may not have a normal bowel movement for a few days.  Please Note:  You might notice some irritation and congestion in your nose or some drainage.  This is from the oxygen used during your procedure.  There is no need for concern and it should clear up in a day or so.  SYMPTOMS TO REPORT IMMEDIATELY:  Following lower endoscopy (colonoscopy or flexible sigmoidoscopy):  Excessive amounts of blood in the stool  Significant tenderness or worsening of abdominal pains  Swelling of the abdomen that is new, acute  Fever of 100F or higher  For urgent or emergent issues, a gastroenterologist can be reached at any hour by calling [phone]. Do not use MyChart messaging for urgent concerns.    DIET:  We do recommend a small meal at first, but then you may proceed to your  regular diet.  Drink plenty of fluids but you should avoid alcoholic beverages for 24 hours.  ACTIVITY:  You should plan to take it easy for the rest of today and you should NOT DRIVE or use heavy machinery until tomorrow (because of the sedation medicines used during the test).    FOLLOW UP: Our staff will call the number listed on your records the next business day following your procedure.  We will call around 7:15- 8:00 am to check on you and address any questions or concerns that you may have regarding the information given to you following your procedure. If we do not reach you, we will leave a message.     If any biopsies were taken you will be contacted by phone or by letter within the next 1-3 weeks.  Please call us  at [phone] if you have not heard about the biopsies in 3 weeks.    SIGNATURES/CONFIDENTIALITY: You and/or your care partner have signed paperwork which will be entered into your electronic medical record.  These signatures attest to the fact that that the information above on your After Visit Summary has been reviewed and is understood.  Full responsibility of the confidentiality of this discharge information lies with you and/or your care-partner.

## 2024-06-03 NOTE — Progress Notes (Signed)
 Pt's states no medical or surgical changes since previsit or office visit.

## 2024-06-03 NOTE — Progress Notes (Signed)
 GASTROENTEROLOGY PROCEDURE H&P NOTE   Primary Care Physician: Billy Philippe SAUNDERS, NP  HPI: Natasha Fritz is a 63 y.o. female who presents for Colonoscopy for surveillance of previous adenomas.  Past Medical History:  Diagnosis Date   Achilles tendinitis    Acid reflux 07/09/2020   Anemia    Anxiety    Basal cell carcinoma of nose 05/23/2015   Endometrial hyperplasia without atypia, simple 09/10/2012   Endometrial hyperplasia without atypia, simple 09/10/2012   HSV-1 infection    HTN (hypertension)    Hyperlipidemia    Migraine    PONV (postoperative nausea and vomiting)    Pre-diabetes    Recurrent upper respiratory infection (URI)    Sleep apnea    Sleep apnea with use of continuous positive airway pressure (CPAP)    Urticaria    Past Surgical History:  Procedure Laterality Date   ACHILLES TENDON SURGERY Right 10/19/2020   Dr. Verta   acl replacement     BILATERAL SALPINGECTOMY  09/10/2012   Procedure: BILATERAL SALPINGECTOMY;  Surgeon: Robbi SAUNDERS Render, MD;  Location: [facility];  Service: Gynecology;  Laterality: Bilateral;   CERVICAL DISC SURGERY  2012   fusion, Dr. Leeann   CHOLECYSTECTOMY N/A 01/19/2023   Procedure: LAPAROSCOPIC CHOLECYSTECTOMY WITH ICG DYE;  Surgeon: Tanda Locus, MD;  Location: [facility];  Service: General;  Laterality: N/A;   COLONOSCOPY  2011   De Quervian's release Bilateral 2019   Dr. Sissy    ROBOTIC ASSISTED TOTAL HYSTERECTOMY  09/10/2012   Procedure: ROBOTIC ASSISTED TOTAL HYSTERECTOMY;  Surgeon: Robbi SAUNDERS Render, MD;  Location: [facility];  Service: Gynecology;  Laterality: N/A;  3 hrs.   TUBAL LIGATION  1994   Current Outpatient Medications  Medication Sig Dispense Refill   ALPRAZolam  (XANAX ) 0.5 MG tablet TAKE 1 TABLET BY MOUTH 2 TIMES DAILY AS NEEDED (Patient taking differently: Take 0.5 mg by mouth daily as needed for anxiety or sleep.) 60 tablet 0   aspirin 81 MG EC tablet Take 81 mg by mouth daily.     azelastine  (ASTELIN ) 0.1 % nasal  spray Place 1 spray into both nostrils 2 (two) times daily. Use in each nostril as directed (Patient taking differently: Place 1 spray into both nostrils 2 (two) times daily as needed for allergies. Use in each nostril as directed) 30 mL 11   benzonatate  (TESSALON ) 100 MG capsule Take 100 mg by mouth 2 (two) times daily as needed.     cetirizine  (ZYRTEC ) 10 MG tablet Take 10 mg by mouth daily.     CHELATED MAGNESIUM PO Take 72 mg by mouth daily.     cholecalciferol (VITAMIN D ) 1000 UNITS tablet Take 1,000 Units by mouth daily.     clindamycin  (CLINDAGEL) 1 % gel Apply 1 Application topically 2 (two) times daily.     cyclobenzaprine  (FLEXERIL ) 5 MG tablet Take 1 tablet (5 mg total) by mouth 3 (three) times daily as needed for muscle spasms. 60 tablet 0   estradiol (ESTRACE) 0.1 MG/GM vaginal cream Place 1 Applicatorful vaginally once a week.     ezetimibe  (ZETIA ) 10 MG tablet Take 1 tablet (10 mg total) by mouth daily. 90 tablet 1   fexofenadine (ALLEGRA) 180 MG tablet Take 180 mg by mouth daily.     fluticasone  (FLONASE ) 50 MCG/ACT nasal spray Place 2 sprays into both nostrils daily. 16 g 11   ketoconazole (NIZORAL) 2 % cream Apply 1 application  topically daily as needed for irritation.  meloxicam  (MOBIC ) 15 MG tablet TAKE 1 TABLET EVERY OTHER DAY AS NEEDED FOR PAIN Do not take with ibuprofen  OR aleve 20 tablet 1   Multiple Vitamin (MULTIVITAMIN) tablet Take 1 tablet by mouth daily.     olmesartan  (BENICAR ) 40 MG tablet TAKE 1/2 TABLET DAILY FOR BLOOD PRESSURE 10 tablet 0   Omega 3-6-9 Fatty Acids (OMEGA 3-6-9 PO) Take 3,600 mg by mouth daily.     ondansetron  (ZOFRAN -ODT) 4 MG disintegrating tablet Take 1 tablet (4 mg total) by mouth every 8 (eight) hours as needed for nausea or vomiting. 30 tablet 0   oxyCODONE -acetaminophen  (PERCOCET/ROXICET) 5-325 MG tablet Take 1 tablet by mouth every 6 (six) hours as needed for severe pain. 12 tablet 0   Semaglutide -Weight Management (WEGOVY ) 2.4 MG/0.75ML  SOAJ Inject one pen once weekly 3 mL 3   valACYclovir  (VALTREX ) 500 MG tablet Take 1 tablet daily-prophylaxis for Cold Sores/Fever Blisters 90 tablet 0   venlafaxine XR (EFFEXOR-XR) 37.5 MG 24 hr capsule Take 37.5 mg by mouth daily. For hot flashes.     No current facility-administered medications for this visit.    Current Outpatient Medications:    ALPRAZolam  (XANAX ) 0.5 MG tablet, TAKE 1 TABLET BY MOUTH 2 TIMES DAILY AS NEEDED (Patient taking differently: Take 0.5 mg by mouth daily as needed for anxiety or sleep.), Disp: 60 tablet, Rfl: 0   aspirin 81 MG EC tablet, Take 81 mg by mouth daily., Disp: , Rfl:    azelastine  (ASTELIN ) 0.1 % nasal spray, Place 1 spray into both nostrils 2 (two) times daily. Use in each nostril as directed (Patient taking differently: Place 1 spray into both nostrils 2 (two) times daily as needed for allergies. Use in each nostril as directed), Disp: 30 mL, Rfl: 11   benzonatate  (TESSALON ) 100 MG capsule, Take 100 mg by mouth 2 (two) times daily as needed., Disp: , Rfl:    cetirizine  (ZYRTEC ) 10 MG tablet, Take 10 mg by mouth daily., Disp: , Rfl:    CHELATED MAGNESIUM PO, Take 72 mg by mouth daily., Disp: , Rfl:    cholecalciferol (VITAMIN D ) 1000 UNITS tablet, Take 1,000 Units by mouth daily., Disp: , Rfl:    clindamycin  (CLINDAGEL) 1 % gel, Apply 1 Application topically 2 (two) times daily., Disp: , Rfl:    cyclobenzaprine  (FLEXERIL ) 5 MG tablet, Take 1 tablet (5 mg total) by mouth 3 (three) times daily as needed for muscle spasms., Disp: 60 tablet, Rfl: 0   estradiol (ESTRACE) 0.1 MG/GM vaginal cream, Place 1 Applicatorful vaginally once a week., Disp: , Rfl:    ezetimibe  (ZETIA ) 10 MG tablet, Take 1 tablet (10 mg total) by mouth daily., Disp: 90 tablet, Rfl: 1   fexofenadine (ALLEGRA) 180 MG tablet, Take 180 mg by mouth daily., Disp: , Rfl:    fluticasone  (FLONASE ) 50 MCG/ACT nasal spray, Place 2 sprays into both nostrils daily., Disp: 16 g, Rfl: 11    ketoconazole (NIZORAL) 2 % cream, Apply 1 application  topically daily as needed for irritation., Disp: , Rfl:    meloxicam  (MOBIC ) 15 MG tablet, TAKE 1 TABLET EVERY OTHER DAY AS NEEDED FOR PAIN Do not take with ibuprofen  OR aleve, Disp: 20 tablet, Rfl: 1   Multiple Vitamin (MULTIVITAMIN) tablet, Take 1 tablet by mouth daily., Disp: , Rfl:    olmesartan  (BENICAR ) 40 MG tablet, TAKE 1/2 TABLET DAILY FOR BLOOD PRESSURE, Disp: 10 tablet, Rfl: 0   Omega 3-6-9 Fatty Acids (OMEGA 3-6-9 PO), Take 3,600 mg by  mouth daily., Disp: , Rfl:    ondansetron  (ZOFRAN -ODT) 4 MG disintegrating tablet, Take 1 tablet (4 mg total) by mouth every 8 (eight) hours as needed for nausea or vomiting., Disp: 30 tablet, Rfl: 0   oxyCODONE -acetaminophen  (PERCOCET/ROXICET) 5-325 MG tablet, Take 1 tablet by mouth every 6 (six) hours as needed for severe pain., Disp: 12 tablet, Rfl: 0   Semaglutide -Weight Management (WEGOVY ) 2.4 MG/0.75ML SOAJ, Inject one pen once weekly, Disp: 3 mL, Rfl: 3   valACYclovir  (VALTREX ) 500 MG tablet, Take 1 tablet daily-prophylaxis for Cold Sores/Fever Blisters, Disp: 90 tablet, Rfl: 0   venlafaxine XR (EFFEXOR-XR) 37.5 MG 24 hr capsule, Take 37.5 mg by mouth daily. For hot flashes., Disp: , Rfl:  Allergies  Allergen Reactions   Penicillins Hives    REACTION: hives reaction seen in 1993 per patient hives were all over no SOB, no facial swelling, no dizziness / syncope   Pravastatin  Other (See Comments)    myalgias   Family History  Problem Relation Age of Onset   Hypertension Mother    Stroke Mother    Breast cancer Mother    Lung cancer Mother    Alzheimer's disease Father    Hyperlipidemia Brother    Heart disease Brother    Diabetes Brother    Hypertension Brother    Hypertension Brother    CVA Brother 55   Asthma Daughter    Cancer Maternal Grandfather    Esophageal cancer Neg Hx    Colon cancer Neg Hx    Colon polyps Neg Hx    Stomach cancer Neg Hx    Rectal cancer Neg Hx     Social History   Socioeconomic History   Marital status: Married    Spouse name: Lamar   Number of children: 3   Years of education: Not on file   Highest education level: Bachelor's degree (e.g., BA, AB, BS)  Occupational History   Not on file  Tobacco Use   Smoking status: Never    Passive exposure: Past   Smokeless tobacco: Never   Tobacco comments:    tobacco use - no   Vaping Use   Vaping status: Never Used  Substance and Sexual Activity   Alcohol use: Yes    Comment: Once every month or two   Drug use: No   Sexual activity: Not Currently    Partners: Male    Birth control/protection: Post-menopausal, Surgical  Other Topics Concern   Not on file  Social History Narrative   Married    Lives at home with husband   Right handed   Caffeine: 3 cups of tea with same tea bag.    full time, gets regular exercise.    Social Drivers of Corporate investment banker Strain: Low Risk  (12/28/2023)   Overall Financial Resource Strain (CARDIA)    Difficulty of Paying Living Expenses: Not hard at all  Food Insecurity: No Food Insecurity (12/28/2023)   Hunger Vital Sign    Worried About Running Out of Food in the Last Year: Never true    Ran Out of Food in the Last Year: Never true  Transportation Needs: No Transportation Needs (12/28/2023)   PRAPARE - Administrator, Civil Service (Medical): No    Lack of Transportation (Non-Medical): No  Physical Activity: Insufficiently Active (12/28/2023)   Exercise Vital Sign    Days of Exercise per Week: 4 days    Minutes of Exercise per Session: 30 min  Stress: No  Stress Concern Present (12/28/2023)   [employer] of Occupational Health - Occupational Stress Questionnaire    Feeling of Stress : Only a little  Social Connections: Socially Integrated (12/29/2023)   Social Connection and Isolation Panel    Frequency of Communication with Friends and Family: More than three times a week    Frequency of Social Gatherings with  Friends and Family: More than three times a week    Attends Religious Services: More than 4 times per year    Active Member of [employer] or Organizations: Yes    Attends Engineer, structural: More than 4 times per year    Marital Status: Married  Catering manager Violence: Not At Risk (12/29/2023)   Humiliation, Afraid, Rape, and Kick questionnaire    Fear of Current or Ex-Partner: No    Emotionally Abused: No    Physically Abused: No    Sexually Abused: No    Physical Exam: There were no vitals filed for this visit. There is no height or weight on file to calculate BMI. GEN: NAD EYE: Sclerae anicteric ENT: MMM CV: Non-tachycardic GI: Soft, NT/ND NEURO:  Alert & Oriented x 3  Lab Results: No results for input(s): WBC, HGB, HCT, PLT in the last 72 hours. BMET No results for input(s): NA, K, CL, CO2, GLUCOSE, BUN, CREATININE, CALCIUM in the last 72 hours. LFT No results for input(s): PROT, ALBUMIN, AST, ALT, ALKPHOS, BILITOT, BILIDIR, IBILI in the last 72 hours. PT/INR No results for input(s): LABPROT, INR in the last 72 hours.   Impression / Plan: This is a 63 y.o.female who presents for Colonoscopy for surveillance of previous adenomas.  The risks and benefits of endoscopic evaluation/treatment were discussed with the patient and/or family; these include but are not limited to the risk of perforation, infection, bleeding, missed lesions, lack of diagnosis, severe illness requiring hospitalization, as well as anesthesia and sedation related illnesses.  The patient's history has been reviewed, patient examined, no change in status, and deemed stable for procedure.  The patient and/or family is agreeable to proceed.    Aloha Finner, MD [facility] Office # [phone]

## 2024-06-03 NOTE — Progress Notes (Signed)
 Called to room to assist during endoscopic procedure.  Patient ID and intended procedure confirmed with present staff. Received instructions for my participation in the procedure from the performing physician.

## 2024-06-06 ENCOUNTER — Telehealth: Payer: Self-pay

## 2024-06-06 NOTE — Telephone Encounter (Signed)
 Follow up call to pt, no answer.

## 2024-06-08 LAB — SURGICAL PATHOLOGY

## 2024-06-09 ENCOUNTER — Ambulatory Visit: Payer: Self-pay | Admitting: Gastroenterology

## 2024-07-07 ENCOUNTER — Other Ambulatory Visit: Payer: Self-pay

## 2024-07-07 DIAGNOSIS — I10 Essential (primary) hypertension: Secondary | ICD-10-CM

## 2024-07-07 MED ORDER — OLMESARTAN MEDOXOMIL 40 MG PO TABS: ORAL_TABLET | ORAL | 2 refills | Status: DC

## 2024-07-19 ENCOUNTER — Ambulatory Visit
Admission: RE | Admit: 2024-07-19 | Discharge: 2024-07-19 | Disposition: A | Discharge status: Home / Self Care | Source: Ambulatory Visit | Attending: Obstetrics & Gynecology | Admitting: Obstetrics & Gynecology

## 2024-07-19 ENCOUNTER — Ambulatory Visit

## 2024-07-19 ENCOUNTER — Encounter: Payer: BC Managed Care – PPO | Admitting: Nurse Practitioner

## 2024-07-19 DIAGNOSIS — Z1231 Encounter for screening mammogram for malignant neoplasm of breast: Secondary | ICD-10-CM | POA: Diagnosis not present

## 2024-07-20 ENCOUNTER — Encounter: Payer: Self-pay | Admitting: Family Medicine

## 2024-07-20 ENCOUNTER — Ambulatory Visit: Payer: Self-pay | Admitting: Family Medicine

## 2024-07-20 ENCOUNTER — Ambulatory Visit (INDEPENDENT_AMBULATORY_CARE_PROVIDER_SITE_OTHER): Admitting: Family Medicine

## 2024-07-20 VITALS — BP 132/86 | HR 78 | Temp 97.6°F | Ht 66.0 in | Wt 160.0 lb

## 2024-07-20 DIAGNOSIS — E663 Overweight: Secondary | ICD-10-CM

## 2024-07-20 DIAGNOSIS — E785 Hyperlipidemia, unspecified: Secondary | ICD-10-CM

## 2024-07-20 DIAGNOSIS — I10 Essential (primary) hypertension: Secondary | ICD-10-CM | POA: Diagnosis not present

## 2024-07-20 DIAGNOSIS — R7303 Prediabetes: Secondary | ICD-10-CM

## 2024-07-20 DIAGNOSIS — Z23 Encounter for immunization: Secondary | ICD-10-CM

## 2024-07-20 DIAGNOSIS — Z0000 Encounter for general adult medical examination without abnormal findings: Secondary | ICD-10-CM | POA: Diagnosis not present

## 2024-07-20 LAB — LIPID PANEL
Cholesterol: 166 mg/dL (ref 0–200)
HDL: 42 mg/dL (ref >39.00)
LDL Cholesterol: 94 mg/dL (ref 0–99)
NonHDL: 123.56
Total CHOL/HDL Ratio: 4
Triglycerides: 147 mg/dL (ref 0.0–149.0)
VLDL: 29.4 mg/dL (ref 0.0–40.0)

## 2024-07-20 LAB — COMPREHENSIVE METABOLIC PANEL WITH GFR
ALT: 19 U/L (ref 0–35)
AST: 21 U/L (ref 0–37)
Albumin: 4.4 g/dL (ref 3.5–5.2)
Alkaline Phosphatase: 93 U/L (ref 39–117)
BUN: 17 mg/dL (ref 6–23)
CO2: 31 meq/L (ref 19–32)
Calcium: 9.5 mg/dL (ref 8.4–10.5)
Chloride: 104 meq/L (ref 96–112)
Creatinine, Ser: 0.8 mg/dL (ref 0.40–1.20)
GFR: 77.8 mL/min (ref >60.00)
Glucose, Bld: 96 mg/dL (ref 70–99)
Potassium: 4 meq/L (ref 3.5–5.1)
Sodium: 140 meq/L (ref 135–145)
Total Bilirubin: 0.5 mg/dL (ref 0.2–1.2)
Total Protein: 7.3 g/dL (ref 6.0–8.3)

## 2024-07-20 LAB — CBC WITH DIFFERENTIAL/PLATELET
Basophils Absolute: 0 K/uL (ref 0.0–0.1)
Basophils Relative: 0.8 % (ref 0.0–3.0)
Eosinophils Absolute: 0.2 K/uL (ref 0.0–0.7)
Eosinophils Relative: 3 % (ref 0.0–5.0)
HCT: 40.3 % (ref 36.0–46.0)
Hemoglobin: 13.5 g/dL (ref 12.0–15.0)
Lymphocytes Relative: 24.9 % (ref 12.0–46.0)
Lymphs Abs: 1.6 K/uL (ref 0.7–4.0)
MCHC: 33.4 g/dL (ref 30.0–36.0)
MCV: 89.4 fl (ref 78.0–100.0)
Monocytes Absolute: 0.4 K/uL (ref 0.1–1.0)
Monocytes Relative: 6.4 % (ref 3.0–12.0)
Neutro Abs: 4.2 K/uL (ref 1.4–7.7)
Neutrophils Relative %: 64.9 % (ref 43.0–77.0)
Platelets: 268 K/uL (ref 150.0–400.0)
RBC: 4.5 Mil/uL (ref 3.87–5.11)
RDW: 13.3 % (ref 11.5–15.5)
WBC: 6.4 K/uL (ref 4.0–10.5)

## 2024-07-20 LAB — HEMOGLOBIN A1C: Hgb A1c MFr Bld: 5.6 % (ref 4.6–6.5)

## 2024-07-20 LAB — TSH: TSH: 2.62 u[IU]/mL (ref 0.35–5.50)

## 2024-07-20 NOTE — Patient Instructions (Signed)
-  It was nice to see you today.  -Physical exam completed.  -Pneumococcal vaccine (pneumonia) -20 provided.  -You may obtain your covid booster at your local pharmacy.  -Continue all medications. -Continue to work a healthy diet and participating in regular exercise.  -Ordered labs. Office will call with lab results and will be available on MyChart.  -Follow up in 6 months for chronic management.

## 2024-07-20 NOTE — Progress Notes (Unsigned)
 Complete physical exam  Patient: Natasha Fritz   DOB: 1961-01-25   63 y.o. Female  MRN: [id number]  Subjective:    Chief Complaint  Patient presents with  . Annual Exam    Natasha Fritz is a 63 y.o. female who presents today for a complete physical exam. She reports consuming a general diet. Exercise: Walk daily- 20 minutes and outside work.  She generally feels well. She reports sleeping poorly. She does not have additional problems to discuss today.    Most recent fall risk assessment:    07/20/2024    7:25 AM  Fall Risk   Falls in the past year? 0  Number falls in past yr: 0  Injury with Fall? 0  Risk for fall due to : No Fall Risks  Follow up Falls evaluation completed     Most recent depression screenings:    07/20/2024    7:25 AM 12/29/2023    1:37 PM  PHQ 2/9 Scores  PHQ - 2 Score 0 0  PHQ- 9 Score 2 2    Vision:Within last year and Dental: No current dental problems and Receives regular dental care  Past Medical History:  Diagnosis Date  . Achilles tendinitis   . Acid reflux 07/09/2020  . Anemia   . Anxiety   . Basal cell carcinoma of nose 05/23/2015  . 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 HSV-1 infection   . HTN (hypertension)   . Hyperlipidemia   . Migraine   . PONV (postoperative nausea and vomiting)   . Pre-diabetes   . Recurrent upper respiratory infection (URI)   . Sleep apnea   . Sleep apnea with use of continuous positive airway pressure (CPAP)   . Urticaria    Past Surgical History:  Procedure Laterality Date  . ACHILLES TENDON SURGERY Right 10/19/2020   Dr. Verta  . acl replacement    . BILATERAL SALPINGECTOMY  09/10/2012   Procedure: BILATERAL SALPINGECTOMY;  Surgeon: Robbi JONELLE Render, MD;  Location: [facility];  Service: Gynecology;  Laterality: Bilateral;  . CERVICAL DISC SURGERY  2012   fusion, Dr. Leeann  . CHOLECYSTECTOMY N/A 01/19/2023   Procedure:  LAPAROSCOPIC CHOLECYSTECTOMY WITH ICG DYE;  Surgeon: Tanda Locus, MD;  Location: [facility];  Service: General;  Laterality: N/A;  . COLONOSCOPY  2011  . De Quervian's release Bilateral 2019   Dr. Sissy   . ROBOTIC ASSISTED TOTAL HYSTERECTOMY  09/10/2012   Procedure: ROBOTIC ASSISTED TOTAL HYSTERECTOMY;  Surgeon: Robbi JONELLE Render, MD;  Location: [facility];  Service: Gynecology;  Laterality: N/A;  3 hrs.  . TUBAL LIGATION  1994   Social History   Tobacco Use  . Smoking status: Never    Passive exposure: Past  . Smokeless tobacco: Never  . Tobacco comments:    tobacco use - no   Vaping Use  . Vaping status: Never Used  Substance Use Topics  . Alcohol use: Yes    Comment: Once every month or two  . Drug use: No   Social History   Socioeconomic History  . Marital status: Married    Spouse name: Lamar  . Number of children: 3  . Years of education: Not on file  . Highest education level: Bachelor's degree (e.g., BA, AB, BS)  Occupational History  . Not on file  Tobacco Use  . Smoking status: Never    Passive exposure: Past  . Smokeless tobacco: Never  .  Tobacco comments:    tobacco use - no   Vaping Use  . Vaping status: Never Used  Substance and Sexual Activity  . Alcohol use: Yes    Comment: Once every month or two  . Drug use: No  . Sexual activity: Not Currently    Partners: Male    Birth control/protection: Post-menopausal, Surgical  Other Topics Concern  . Not on file  Social History Narrative   Married    Lives at home with husband   Right handed   Caffeine: 3 cups of tea with same tea bag.    full time, gets regular exercise.    Social Drivers of Corporate Investment Banker Strain: Low Risk  (07/18/2024)   Overall Financial Resource Strain (CARDIA)   . Difficulty of Paying Living Expenses: Not hard at all  Food Insecurity: No Food Insecurity (07/18/2024)   Hunger Vital Sign   . Worried About Programme Researcher, Broadcasting/film/video in the Last Year: Never true   . Ran Out  of Food in the Last Year: Never true  Transportation Needs: No Transportation Needs (07/18/2024)   PRAPARE - Transportation   . Lack of Transportation (Medical): No   . Lack of Transportation (Non-Medical): No  Physical Activity: Insufficiently Active (07/18/2024)   Exercise Vital Sign   . Days of Exercise per Week: 4 days   . Minutes of Exercise per Session: 20 min  Stress: No Stress Concern Present (07/18/2024)   [employer] of Occupational Health - Occupational Stress Questionnaire   . Feeling of Stress: Only a little  Social Connections: Socially Integrated (07/18/2024)   Social Connection and Isolation Panel   . Frequency of Communication with Friends and Family: More than three times a week   . Frequency of Social Gatherings with Friends and Family: Once a week   . Attends Religious Services: More than 4 times per year   . Active Member of Clubs or Organizations: Yes   . Attends Banker Meetings: More than 4 times per year   . Marital Status: Married  Catering Manager Violence: Not At Risk (12/29/2023)   Humiliation, Afraid, Rape, and Kick questionnaire   . Fear of Current or Ex-Partner: No   . Emotionally Abused: No   . Physically Abused: No   . Sexually Abused: No   Family Status  Relation Name Status  . Mother  Deceased  . Father  Deceased at age 7       injury during fall caused pneumonia   . Brother  Alive  . Brother  Alive  . Daughter  Alive  . Daughter  Alive  . Son  Alive  . MGM  Deceased  . MGF  Deceased  . PGM  Deceased  . PGF  Deceased  . Neg Hx  (Not Specified)  No partnership data on file   Family History  Problem Relation Age of Onset  . Hypertension Mother   . Stroke Mother   . Breast cancer Mother   . Lung cancer Mother   . Alzheimer's disease Father   . Hyperlipidemia Brother   . Heart disease Brother   . Diabetes Brother   . Hypertension Brother   . Hypertension Brother   . CVA Brother 3  . Asthma Daughter   . Cancer  Maternal Grandfather   . Esophageal cancer Neg Hx   . Colon cancer Neg Hx   . Colon polyps Neg Hx   . Stomach cancer Neg Hx   .  Rectal cancer Neg Hx    Allergies  Allergen Reactions  . Penicillins Hives    REACTION: hives reaction seen in 1993 per patient hives were all over no SOB, no facial swelling, no dizziness / syncope  . Pravastatin  Other (See Comments)    myalgias   Patient Care Team: Billy Philippe SAUNDERS, NP as PCP - General (Family Medicine) Jane Charleston, MD as Consulting Physician (Orthopedic Surgery) Debrah Charleston BIRCH, MD (Inactive) as Consulting Physician (Gastroenterology) Barbette Knock, MD as Consulting Physician (Obstetrics and Gynecology) Ivin Kocher, MD as Consulting Physician (Dermatology) Cary No, NP as Nurse Practitioner (Family Medicine)  [facility]   Outpatient Medications Prior to Visit  Medication Sig  . ALPRAZolam  (XANAX ) 0.5 MG tablet TAKE 1 TABLET BY MOUTH 2 TIMES DAILY AS NEEDED (Patient taking differently: Take 0.5 mg by mouth daily as needed for anxiety or sleep.)  . aspirin 81 MG EC tablet Take 81 mg by mouth daily.  . azelastine  (ASTELIN ) 0.1 % nasal spray Place 1 spray into both nostrils 2 (two) times daily. Use in each nostril as directed (Patient taking differently: Place 1 spray into both nostrils 2 (two) times daily as needed for allergies. Use in each nostril as directed)  . benzonatate  (TESSALON ) 100 MG capsule Take 100 mg by mouth 2 (two) times daily as needed.  . cetirizine  (ZYRTEC ) 10 MG tablet Take 10 mg by mouth daily.  . CHELATED MAGNESIUM PO Take 72 mg by mouth daily.  . cholecalciferol (VITAMIN D ) 1000 UNITS tablet Take 1,000 Units by mouth daily.  . clindamycin  (CLINDAGEL) 1 % gel Apply 1 Application topically 2 (two) times daily.  . cyclobenzaprine  (FLEXERIL ) 5 MG tablet Take 1 tablet (5 mg total) by mouth 3 (three) times daily as needed for muscle spasms.  SABRA estradiol (ESTRACE) 0.1 MG/GM vaginal cream  Place 1 Applicatorful vaginally once a week.  . ezetimibe  (ZETIA ) 10 MG tablet Take 1 tablet (10 mg total) by mouth daily.  . fexofenadine (ALLEGRA) 180 MG tablet Take 180 mg by mouth daily.  . fluticasone  (FLONASE ) 50 MCG/ACT nasal spray Place 2 sprays into both nostrils daily.  SABRA ketoconazole (NIZORAL) 2 % cream Apply 1 application  topically daily as needed for irritation.  . meloxicam  (MOBIC ) 15 MG tablet TAKE 1 TABLET EVERY OTHER DAY AS NEEDED FOR PAIN Do not take with ibuprofen  OR aleve  . Multiple Vitamin (MULTIVITAMIN) tablet Take 1 tablet by mouth daily.  . olmesartan  (BENICAR ) 40 MG tablet TAKE 1/2 TABLET DAILY FOR BLOOD PRESSURE  . Omega 3-6-9 Fatty Acids (OMEGA 3-6-9 PO) Take 3,600 mg by mouth daily.  . ondansetron  (ZOFRAN -ODT) 4 MG disintegrating tablet Take 1 tablet (4 mg total) by mouth every 8 (eight) hours as needed for nausea or vomiting.  . Semaglutide -Weight Management (WEGOVY ) 2.4 MG/0.75ML SOAJ Inject one pen once weekly  . valACYclovir  (VALTREX ) 500 MG tablet Take 1 tablet daily-prophylaxis for Cold Sores/Fever Blisters  . venlafaxine XR (EFFEXOR-XR) 37.5 MG 24 hr capsule Take 37.5 mg by mouth daily. For hot flashes.  . [DISCONTINUED] oxyCODONE -acetaminophen  (PERCOCET/ROXICET) 5-325 MG tablet Take 1 tablet by mouth every 6 (six) hours as needed for severe pain.   No facility-administered medications prior to visit.    Review of Systems  Constitutional: Negative.   HENT: Negative.    Eyes: Negative.   Respiratory: Negative.    Cardiovascular: Negative.   Gastrointestinal:  Positive for constipation (Mild).  Genitourinary: Negative.   Musculoskeletal:  Positive for joint pain and myalgias.  Skin:  Positive for itching (Interittent-left arm).  Neurological:  Positive for headaches (Occasional).  Endo/Heme/Allergies: Negative.   Psychiatric/Behavioral: Negative.     See HPI above    Objective:   BP 132/86   Pulse 78   Temp 97.6 F (36.4 C) (Oral)   Ht 5' 6  (1.676 m)   Wt 160 lb (72.6 kg)   LMP 09/05/2012   SpO2 97%   BMI 25.82 kg/m  {Vitals History (Optional):23777}  Physical Exam Vitals reviewed.  Constitutional:      General: She is not in acute distress.    Appearance: Normal appearance. She is not ill-appearing, toxic-appearing or diaphoretic.  HENT:     Head: Normocephalic and atraumatic.     Right Ear: Tympanic membrane, ear canal and external ear normal. There is no impacted cerumen.     Left Ear: Tympanic membrane, ear canal and external ear normal. There is no impacted cerumen.     Nose:     Right Sinus: No maxillary sinus tenderness or frontal sinus tenderness.     Left Sinus: No maxillary sinus tenderness or frontal sinus tenderness.     Mouth/Throat:     Mouth: Mucous membranes are moist.     Pharynx: Oropharynx is clear. Uvula midline. No pharyngeal swelling, oropharyngeal exudate, posterior oropharyngeal erythema or uvula swelling.  Eyes:     General:        Right eye: No discharge.        Left eye: No discharge.     Conjunctiva/sclera: Conjunctivae normal.     Pupils: Pupils are equal, round, and reactive to light.  Neck:     Thyroid : No thyromegaly.  Cardiovascular:     Rate and Rhythm: Normal rate and regular rhythm.     Heart sounds: Normal heart sounds. No murmur heard.    No friction rub. No gallop.  Pulmonary:     Effort: Pulmonary effort is normal. No respiratory distress.     Breath sounds: Normal breath sounds.  Abdominal:     General: Abdomen is flat. Bowel sounds are normal. There is no distension.     Palpations: Abdomen is soft. There is no mass.     Tenderness: There is no abdominal tenderness.  Musculoskeletal:        General: Normal range of motion.     Cervical back: Normal range of motion.     Right lower leg: No edema.     Left lower leg: No edema.  Lymphadenopathy:     Head:     Right side of head: No submental or submandibular adenopathy.     Left side of head: No submental or  submandibular adenopathy.     Cervical: No cervical adenopathy.  Skin:    General: Skin is warm and dry.  Neurological:     General: No focal deficit present.     Mental Status: She is alert and oriented to person, place, and time. Mental status is at baseline.     Motor: No weakness.     Gait: Gait normal.  Psychiatric:        Mood and Affect: Mood normal.        Behavior: Behavior normal.        Thought Content: Thought content normal.        Judgment: Judgment normal.       Assessment & Plan:    Routine Health Maintenance and Physical Exam  Immunization History  Administered Date(s) Administered  . Influenza Inj Mdck  Quad Pf 07/02/2021  . Influenza Inj Mdck Quad With Preservative 07/09/2020  . Influenza,inj,Quad PF,6+ Mos 07/05/2024  . Influenza-Unspecified 06/22/2013, 06/22/2017, 07/12/2018, 07/04/2019, 06/17/2022, 07/06/2023  . PFIZER(Purple Top)SARS-COV-2 Vaccination 10/12/2019, 11/02/2019, 06/26/2020, 06/04/2021  . PNEUMOCOCCAL CONJUGATE-20 07/20/2024  . Pfizer Covid-19 Vaccine Bivalent Booster 72yrs & up 03/28/2022, 06/24/2022  . Tdap 09/22/2006, 08/10/2017  . Zoster Recombinant(Shingrix) 06/22/2017, 11/04/2017    Health Maintenance  Topic Date Due  . COVID-19 Vaccine (7 - 2025-26 season) 05/23/2024  . Mammogram  07/11/2024  . DTaP/Tdap/Td (3 - Td or Tdap) 08/11/2027  . Colonoscopy  06/04/2031  . Pneumococcal Vaccine: 50+ Years  Completed  . Influenza Vaccine  Completed  . Hepatitis C Screening  Completed  . HIV Screening  Completed  . Zoster Vaccines- Shingrix  Completed  . Hepatitis B Vaccines 19-59 Average Risk  Aged Out  . HPV VACCINES  Aged Out  . Meningococcal B Vaccine  Aged Out    Discussed health benefits of physical activity, and encouraged her to engage in regular exercise appropriate for her age and condition.  Annual physical exam -     CBC with Differential/Platelet -     Comprehensive metabolic panel with GFR -     Hemoglobin A1c -      Lipid panel -     TSH  Essential hypertension -     Comprehensive metabolic panel with GFR  Hyperlipidemia, unspecified hyperlipidemia type -     Comprehensive metabolic panel with GFR -     Lipid panel  Prediabetes -     Hemoglobin A1c  Overweight (BMI 25.0-29.9) -     CBC with Differential/Platelet -     Comprehensive metabolic panel with GFR -     Hemoglobin A1c -     Lipid panel -     TSH  Immunization due -     Pneumococcal conjugate vaccine 20-valent  1.Review health maintenance:  -Covid booster: May obtain at local pharmacy -Mammogram: Yesterday at [facility]  -PNA vaccine: Administered    Return in about 6 months (around 01/18/2025) for chronic management.     Clyde Zarrella, NP

## 2024-08-05 DIAGNOSIS — Z01419 Encounter for gynecological examination (general) (routine) without abnormal findings: Secondary | ICD-10-CM | POA: Diagnosis not present

## 2024-08-05 DIAGNOSIS — Z1331 Encounter for screening for depression: Secondary | ICD-10-CM | POA: Diagnosis not present

## 2024-08-22 ENCOUNTER — Encounter: Payer: Self-pay | Admitting: Family Medicine

## 2024-09-01 DIAGNOSIS — L218 Other seborrheic dermatitis: Secondary | ICD-10-CM | POA: Diagnosis not present

## 2024-09-01 DIAGNOSIS — L814 Other melanin hyperpigmentation: Secondary | ICD-10-CM | POA: Diagnosis not present

## 2024-09-01 DIAGNOSIS — L821 Other seborrheic keratosis: Secondary | ICD-10-CM | POA: Diagnosis not present

## 2024-09-01 DIAGNOSIS — D225 Melanocytic nevi of trunk: Secondary | ICD-10-CM | POA: Diagnosis not present

## 2024-09-21 ENCOUNTER — Other Ambulatory Visit: Payer: Self-pay | Admitting: Family Medicine

## 2024-10-04 ENCOUNTER — Encounter: Payer: Self-pay | Admitting: Family Medicine

## 2024-10-05 ENCOUNTER — Other Ambulatory Visit: Payer: Self-pay

## 2024-10-05 DIAGNOSIS — I10 Essential (primary) hypertension: Secondary | ICD-10-CM

## 2024-10-05 DIAGNOSIS — E663 Overweight: Secondary | ICD-10-CM

## 2024-10-05 MED ORDER — OLMESARTAN MEDOXOMIL 40 MG PO TABS: ORAL_TABLET | ORAL | 2 refills | Status: AC

## 2024-10-05 MED ORDER — WEGOVY 2.4 MG/0.75ML ~~LOC~~ SOAJ: SUBCUTANEOUS | 3 refills | Status: AC

## 2024-10-14 ENCOUNTER — Encounter: Payer: Self-pay | Admitting: Internal Medicine

## 2025-01-18 ENCOUNTER — Ambulatory Visit: Admitting: Family Medicine

## 2025-02-15 ENCOUNTER — Ambulatory Visit: Admitting: Family Medicine
